# Patient Record
Sex: Male | Born: 1940
Health system: Southern US, Community
[De-identification: ages and names within clinical notes are randomized; demographics above are authoritative.]

## PROBLEM LIST (undated history)

## (undated) DIAGNOSIS — J189 Pneumonia, unspecified organism: Secondary | ICD-10-CM

## (undated) DIAGNOSIS — J449 Chronic obstructive pulmonary disease, unspecified: Secondary | ICD-10-CM

## (undated) DIAGNOSIS — E079 Disorder of thyroid, unspecified: Secondary | ICD-10-CM

## (undated) DIAGNOSIS — R011 Cardiac murmur, unspecified: Secondary | ICD-10-CM

## (undated) DIAGNOSIS — E785 Hyperlipidemia, unspecified: Secondary | ICD-10-CM

## (undated) DIAGNOSIS — R0600 Dyspnea, unspecified: Secondary | ICD-10-CM

## (undated) DIAGNOSIS — M4722 Other spondylosis with radiculopathy, cervical region: Secondary | ICD-10-CM

## (undated) DIAGNOSIS — K219 Gastro-esophageal reflux disease without esophagitis: Secondary | ICD-10-CM

## (undated) DIAGNOSIS — I10 Essential (primary) hypertension: Secondary | ICD-10-CM

## (undated) HISTORY — DX: Gastro-esophageal reflux disease without esophagitis: K21.9

## (undated) HISTORY — PX: COLONOSCOPY: SHX174

## (undated) HISTORY — DX: Essential (primary) hypertension: I10

## (undated) HISTORY — DX: Cardiac murmur, unspecified: R01.1

## (undated) HISTORY — PX: HERNIA REPAIR: SHX51

## (undated) HISTORY — PX: TONSILLECTOMY: SUR1361

## (undated) HISTORY — DX: Pneumonia, unspecified organism: J18.9

## (undated) HISTORY — PX: LEG SURGERY: SHX1003

## (undated) HISTORY — DX: Hyperlipidemia, unspecified: E78.5

---

## 2003-05-10 HISTORY — PX: ROTATOR CUFF REPAIR: SHX139

## 2009-10-07 DIAGNOSIS — J189 Pneumonia, unspecified organism: Secondary | ICD-10-CM

## 2009-10-07 HISTORY — DX: Pneumonia, unspecified organism: J18.9

## 2010-10-20 ENCOUNTER — Encounter: Payer: Self-pay | Admitting: Family Medicine

## 2010-10-20 ENCOUNTER — Ambulatory Visit (INDEPENDENT_AMBULATORY_CARE_PROVIDER_SITE_OTHER): Payer: Medicare Other | Admitting: Family Medicine

## 2010-10-20 VITALS — BP 127/81 | HR 73 | Resp 20 | Ht 69.0 in | Wt 166.0 lb

## 2010-10-20 DIAGNOSIS — E039 Hypothyroidism, unspecified: Secondary | ICD-10-CM | POA: Insufficient documentation

## 2010-10-20 DIAGNOSIS — J449 Chronic obstructive pulmonary disease, unspecified: Secondary | ICD-10-CM | POA: Insufficient documentation

## 2010-10-20 HISTORY — DX: Chronic obstructive pulmonary disease, unspecified: J44.9

## 2010-10-20 NOTE — Assessment & Plan Note (Signed)
He plans to schedule a physical this summer he will be due to recheck his thyroid at that point. I explained this is a lifelong diagnosis and typically does not resolve. We will monitor his levels and adjust as needed.

## 2010-10-20 NOTE — Patient Instructions (Signed)
Please schedule a physical anytime.

## 2010-10-20 NOTE — Assessment & Plan Note (Signed)
I am not sure what stage he is. I discussed that depending on the stage there are certain guidelines that recommend certain inhalers to help prevent decreased functioning of his lungs. I did explain that the inhalers may not make a significant difference in how he feels on a daily basis. I suspect that likely [facility] Symbicort because he felt the albuterol wasn't helping. Does he is likely either mild or moderately stage COPD. I would like to get his records I can better have a sense of what types of inhalers he should be using. We could also consider adding Spiriva if he if he has moderate COPD and see if he feels like he notices more improvement on that medication versus the Symbicort. Symbicort is typically used for severe COPD.

## 2010-10-20 NOTE — Progress Notes (Signed)
Subjective:    Patient ID: Zachary Waters, male    DOB: November 29, 1940, 70 y.o.   MRN: [id number]  HPI Got PNA last year and ever since hasn't felt his exercise capacity is up to par.  Wasn't able to get back to his usual level of exercise. Went to see [facility] Chest and told has emphysema. He Is not sure what stage he is.   Normal cardiac workup around that time as well. He was given Clifton Custard didn't notice any significant difference with his ability to exercise 7 he was started on higher dose of Symbicort.  He still has not noticed any significant difference in his breathing on his ability to exercise. He would like to get back to his previous level of exercise.  Wonders if has too many BMs he has 4 BMs usually in the first 2-3 hours of the morning.  He has no further bowel movements during the day. His stools are soft and he does not have to strain. The STIR or abdominal pain. He does wonders if this is normal.  He was also diagnosed with hypothyroid during this last year. He was initially started on 25 mcg of Synthroid and then increase to 50 mcg. He wonders if he really needs this medication.   Review of Systems  Constitutional: Negative for fever, diaphoresis and unexpected weight change.  HENT: Negative for hearing loss, rhinorrhea, sneezing and tinnitus.   Eyes: Negative for visual disturbance.  Respiratory: Negative for cough and wheezing.   Cardiovascular: Negative for chest pain and palpitations.  Gastrointestinal: Negative for nausea, vomiting, diarrhea and blood in stool.  Genitourinary: Negative for dysuria and discharge.  Musculoskeletal: Negative for myalgias and arthralgias.  Skin: Negative for rash.  Neurological: Negative for headaches.  Hematological: Negative for adenopathy.  Psychiatric/Behavioral: Negative for sleep disturbance and dysphoric mood. The patient is not nervous/anxious.    BP 127/81  Pulse 73  Resp 20  Ht 5\' 9"  (1.753 m)  Wt 166 lb (75.297 kg)  BMI 24.51 kg/m2   SpO2 96%    No Known Allergies  Past Medical History  Diagnosis Date  . Pneumonia 10/2009    Past Surgical History  Procedure Date  . Rotator cuff repair 2005    History   Social History  . Marital Status: Married    Spouse Name: N/A    Number of Children: N/A  . Years of Education: N/A   Occupational History  . retired    Social History Main Topics  . Smoking status: Former Smoker    Types: Pipe    Quit date: 05/09/2009  . Smokeless tobacco: Not on file  . Alcohol Use: Not on file  . Drug Use: Not on file  . Sexually Active: Not on file   Other Topics Concern  . Not on file   Social History Narrative   Power walks 3 days per week for 2 miles. Golfs twice a week.     Family History  Problem Relation Age of Onset  . Prostate cancer Father   . Depression Brother     Commited suicide    Current outpatient prescriptions:albuterol (PROAIR HFA) 108 (90 BASE) MCG/ACT inhaler, Inhale 2 puffs into the lungs every 6 (six) hours as needed.  , Disp: , Rfl: ;  budesonide-formoterol (SYMBICORT) 160-4.5 MCG/ACT inhaler, Inhale 2 puffs into the lungs 2 (two) times daily.  , Disp: , Rfl: ;  levothyroxine (SYNTHROID, LEVOTHROID) 50 MCG tablet, Take 50 mcg by mouth daily.  , Disp: ,  Rfl:      Objective:   Physical Exam  Constitutional: He is oriented to person, place, and time. He appears well-developed and well-nourished.  HENT:  Head: Atraumatic.  Eyes: Conjunctivae are normal.  Neck: Neck supple. No thyromegaly present.  Cardiovascular: Normal rate, regular rhythm and normal heart sounds.   Pulmonary/Chest: Effort normal and breath sounds normal.  Musculoskeletal: He exhibits no edema.  Lymphadenopathy:    He has no cervical adenopathy.  Neurological: He is alert and oriented to person, place, and time.  Skin: Skin is warm and dry.       He does have a significant amount of sun damage.  Psychiatric: He has a normal mood and affect.          Assessment & Plan:    in

## 2010-10-27 ENCOUNTER — Encounter: Payer: Self-pay | Admitting: Family Medicine

## 2010-10-27 ENCOUNTER — Ambulatory Visit (INDEPENDENT_AMBULATORY_CARE_PROVIDER_SITE_OTHER): Payer: Medicare Other | Admitting: Family Medicine

## 2010-10-27 VITALS — BP 129/79 | HR 74 | Resp 20 | Ht 68.0 in | Wt 166.0 lb

## 2010-10-27 DIAGNOSIS — Z0000 Encounter for general adult medical examination without abnormal findings: Secondary | ICD-10-CM

## 2010-10-27 DIAGNOSIS — E039 Hypothyroidism, unspecified: Secondary | ICD-10-CM

## 2010-10-27 MED ORDER — AMBULATORY NON FORMULARY MEDICATION: Status: DC

## 2010-10-27 NOTE — Patient Instructions (Signed)
Keep up the exercise and healthy diet! We will call you with your lab results.

## 2010-10-27 NOTE — Progress Notes (Signed)
Subjective:    Patient ID: Zachary Waters, male    DOB: 1941-03-05, 70 y.o.   MRN: [id number]  HPI Here for CPE.  No specific complaints.  Works our regularly. Healthy diet.    Review of Systems No chest pain, shortness of breath, blood in the stool or urine. No recent vision changes. He does have some mild hearing loss.  BP 129/79  Pulse 74  Resp 20  Ht 5\' 8"  (1.727 m)  Wt 166 lb (75.297 kg)  BMI 25.24 kg/m2  SpO2 96%    No Known Allergies  Past Medical History  Diagnosis Date  . Pneumonia 10/2009    Past Surgical History  Procedure Date  . Rotator cuff repair 2005    Left    History   Social History  . Marital Status: Married    Spouse Name: N/A    Number of Children: 2  . Years of Education: N/A   Occupational History  . retired    Social History Main Topics  . Smoking status: Former Smoker    Types: Pipe    Quit date: 10/07/2009  . Smokeless tobacco: Not on file  . Alcohol Use: Yes  . Drug Use: No  . Sexually Active: Not on file   Other Topics Concern  . Not on file   Social History Narrative   Power walks 3 days per week for 2 miles. Golfs twice a week.     Family History  Problem Relation Age of Onset  . Prostate cancer Father 44  . Depression Brother     Commited suicide  . Heart disease Mother 57    deceased age 84    Current outpatient prescriptions:albuterol (PROAIR HFA) 108 (90 BASE) MCG/ACT inhaler, Inhale 2 puffs into the lungs every 6 (six) hours as needed.  , Disp: , Rfl: ;  aspirin 81 MG tablet, Take 81 mg by mouth daily.  , Disp: , Rfl: ;  budesonide-formoterol (SYMBICORT) 160-4.5 MCG/ACT inhaler, Inhale 2 puffs into the lungs 2 (two) times daily.  , Disp: , Rfl:  levothyroxine (SYNTHROID, LEVOTHROID) 50 MCG tablet, Take 50 mcg by mouth daily.  , Disp: , Rfl: ;  AMBULATORY NON FORMULARY MEDICATION, Medication Name: Zostavax IM x 1, Disp: 1 vial, Rfl: 0     Objective:   Physical Exam  Constitutional: He is oriented to person,  place, and time. He appears well-developed and well-nourished.  HENT:  Head: Normocephalic and atraumatic.  Right Ear: External ear normal.  Left Ear: External ear normal.  Nose: Nose normal.  Mouth/Throat: Oropharynx is clear and moist.  Eyes: Conjunctivae and EOM are normal. Pupils are equal, round, and reactive to light.  Neck: Normal range of motion. Neck supple. No thyromegaly present.  Cardiovascular: Normal rate, regular rhythm, normal heart sounds and intact distal pulses.   Pulmonary/Chest: Effort normal and breath sounds normal.  Abdominal: Soft. Bowel sounds are normal. He exhibits no distension and no mass. There is no tenderness. There is no rebound and no guarding.  Genitourinary: Rectum normal. Prostate is enlarged. Prostate is not tender.       External hemorrhoid.   Musculoskeletal: Normal range of motion.  Lymphadenopathy:    He has no cervical adenopathy.  Neurological: He is alert and oriented to person, place, and time. He has normal reflexes.  Skin: Skin is warm and dry.  Psychiatric: He has a normal mood and affect. His behavior is normal. Judgment and thought content normal.   Guiaiac negative.  Assessment & Plan:  CPE- EXam is normal. Will send for fasting labs. Given a slip for the shingles vaccine. Thinks had Tdap in the last 10 years but will await old records and review. He did have a slightly enlarged prostate on exam. We'll check a PSA today as he does have a family history of colon cancer.  Hypothyroid he is to recheck his levels and we will do that today as well.

## 2010-10-28 ENCOUNTER — Telehealth: Payer: Self-pay | Admitting: Family Medicine

## 2010-10-28 LAB — LIPID PANEL
LDL Cholesterol: 133 mg/dL — ABNORMAL HIGH (ref 0–99)
Triglycerides: 68 mg/dL (ref <150)
VLDL: 14 mg/dL (ref 0–40)

## 2010-10-28 LAB — COMPLETE METABOLIC PANEL WITH GFR
ALT: 18 U/L (ref 0–53)
AST: 21 U/L (ref 0–37)
Albumin: 4.6 g/dL (ref 3.5–5.2)
CO2: 30 mEq/L (ref 19–32)
Calcium: 9.5 mg/dL (ref 8.4–10.5)
Chloride: 105 mEq/L (ref 96–112)
Creat: 1.07 mg/dL (ref 0.50–1.35)
GFR, Est African American: >60 mL/min (ref >60)
Potassium: 5 mEq/L (ref 3.5–5.3)
Sodium: 140 mEq/L (ref 135–145)
Total Protein: 6.6 g/dL (ref 6.0–8.3)

## 2010-10-28 MED ORDER — LEVOTHYROXINE SODIUM 75 MCG PO TABS: 50.0000 ug | ORAL_TABLET | Freq: Every day | ORAL | Status: DC

## 2010-10-28 NOTE — Telephone Encounter (Signed)
Pt notified pt of results of rec.

## 2010-10-28 NOTE — Telephone Encounter (Signed)
Call patient: Complete metabolic panel is normal. PSA is normal. TSH is just borderline elevated. Based on this level are recommended increasing his dose and rechecking in 8 weeks. His total cholesterol and LDL cholesterol is slightly elevated. Continue to work on exercise and diet and consider adding fish oil or flaxseed to your diet. Recheck in one year cholesterol.

## 2010-11-05 ENCOUNTER — Telehealth: Payer: Self-pay | Admitting: Family Medicine

## 2010-11-05 NOTE — Telephone Encounter (Signed)
Pt called and is concerned about his current dose of thyroid medcation.  TSH was high over 4 and pt dose changed from 50 mcg to 75 mcg and to cut the 75 mcg in half to make 37.5 mch daily.   Plan:  Instructed and explained the regimen to the patient.  Pt voiced understanding after explained to him. Jarvis Newcomer, LPN [facility]

## 2011-07-11 ENCOUNTER — Other Ambulatory Visit: Payer: Self-pay | Admitting: *Deleted

## 2011-07-11 MED ORDER — LEVOTHYROXINE SODIUM 75 MCG PO TABS: 50.0000 ug | ORAL_TABLET | Freq: Every day | ORAL | Status: DC

## 2011-11-08 ENCOUNTER — Encounter: Payer: Self-pay | Admitting: Family Medicine

## 2012-01-03 ENCOUNTER — Ambulatory Visit (INDEPENDENT_AMBULATORY_CARE_PROVIDER_SITE_OTHER): Payer: Medicare Other | Admitting: Family Medicine

## 2012-01-03 ENCOUNTER — Encounter: Payer: Self-pay | Admitting: Family Medicine

## 2012-01-03 VITALS — BP 140/90 | HR 61 | Ht 68.0 in | Wt 170.0 lb

## 2012-01-03 DIAGNOSIS — J449 Chronic obstructive pulmonary disease, unspecified: Secondary | ICD-10-CM

## 2012-01-03 DIAGNOSIS — E039 Hypothyroidism, unspecified: Secondary | ICD-10-CM

## 2012-01-03 DIAGNOSIS — Z13220 Encounter for screening for lipoid disorders: Secondary | ICD-10-CM

## 2012-01-03 DIAGNOSIS — Z0000 Encounter for general adult medical examination without abnormal findings: Secondary | ICD-10-CM

## 2012-01-03 DIAGNOSIS — Z23 Encounter for immunization: Secondary | ICD-10-CM

## 2012-01-03 DIAGNOSIS — Z125 Encounter for screening for malignant neoplasm of prostate: Secondary | ICD-10-CM

## 2012-01-03 LAB — COMPLETE METABOLIC PANEL WITH GFR
ALT: 16 U/L (ref 0–53)
Alkaline Phosphatase: 50 U/L (ref 39–117)
Creat: 1 mg/dL (ref 0.50–1.35)
GFR, Est African American: 89 mL/min
GFR, Est Non African American: 77 mL/min
Glucose, Bld: 85 mg/dL (ref 70–99)
Sodium: 140 mEq/L (ref 135–145)
Total Bilirubin: 0.6 mg/dL (ref 0.3–1.2)
Total Protein: 6.8 g/dL (ref 6.0–8.3)

## 2012-01-03 LAB — LIPID PANEL
HDL: 59 mg/dL (ref >39)
Total CHOL/HDL Ratio: 3.1 Ratio
Triglycerides: 64 mg/dL (ref <150)

## 2012-01-03 LAB — TSH: TSH: 6.418 u[IU]/mL — ABNORMAL HIGH (ref 0.350–4.500)

## 2012-01-03 LAB — PSA: PSA: 1.14 ng/mL (ref <=4.00)

## 2012-01-03 NOTE — Progress Notes (Signed)
Subjective:    Zachary Waters is a 71 y.o. male who presents for Medicare Annual/Subsequent preventive examination. He updated me on a couple of issues today. He has which pulmonologist's he is much happier were he's going now with Dr. Onalee Hua. They have switched around his medication regimen. He just completed a course of what sounds like cardiac rehabilitation and is looking into doing pulmonary rehabilitation as well. He feels like this is been very helpful and great for him.   Preventive Screening-Counseling & Management  Tobacco History  Smoking status  . Former Smoker  . Types: Pipe  . Quit date: 10/07/2009  Smokeless tobacco  . Not on file    Problems Prior to Visit 1.   Current Problems (verified) Patient Active Problem List  Diagnosis  . Hypothyroid  . COPD (chronic obstructive pulmonary disease)    Medications Prior to Visit Current Outpatient Prescriptions on File Prior to Visit  Medication Sig Dispense Refill  . albuterol (PROAIR HFA) 108 (90 BASE) MCG/ACT inhaler Inhale 2 puffs into the lungs every 6 (six) hours as needed.        Marland Kitchen aspirin 81 MG tablet Take 81 mg by mouth daily.        Marland Kitchen tiotropium (SPIRIVA) 18 MCG inhalation capsule Place 18 mcg into inhaler and inhale daily.        Current Medications (verified) Current Outpatient Prescriptions  Medication Sig Dispense Refill  . albuterol (PROAIR HFA) 108 (90 BASE) MCG/ACT inhaler Inhale 2 puffs into the lungs every 6 (six) hours as needed.        . Ascorbic Acid (VITAMIN C PO) Take by mouth.      Marland Kitchen aspirin 81 MG tablet Take 81 mg by mouth daily.        . Fish Oil OIL by Does not apply route.      Marland Kitchen levothyroxine (SYNTHROID, LEVOTHROID) 50 MCG tablet Take 50 mcg by mouth daily.      . mometasone-formoterol (DULERA) 100-5 MCG/ACT AERO Inhale 2 puffs into the lungs 2 (two) times daily.      Marland Kitchen tiotropium (SPIRIVA) 18 MCG inhalation capsule Place 18 mcg into inhaler and inhale daily.         Allergies  (verified) Review of patient's allergies indicates no known allergies.   PAST HISTORY  Family History Family History  Problem Relation Age of Onset  . Prostate cancer Father 52  . Depression Brother     Commited suicide  . Heart disease Mother 79    deceased age 15    Social History History  Substance Use Topics  . Smoking status: Former Smoker    Types: Pipe    Quit date: 10/07/2009  . Smokeless tobacco: Not on file  . Alcohol Use: Yes    Are there smokers in your home (other than you)?  No  Risk Factors Current exercise habits: Working out 3 times per week  Dietary issues discussed: doing well.     Cardiac risk factors: advanced age (older than 38 for men, 42 for women) and male gender.  Depression Screen (Note: if answer to either of the following is "Yes", a more complete depression screening is indicated)   Q1: Over the past two weeks, have you felt down, depressed or hopeless? No  Q2: Over the past two weeks, have you felt little interest or pleasure in doing things? No  Have you lost interest or pleasure in daily life? No  Do you often feel hopeless? No  Do you  cry easily over simple problems? No  Activities of Daily Living In your present state of health, do you have any difficulty performing the following activities?:  Driving? No Managing money?  No Feeding yourself? No Getting from bed to chair? No Climbing a flight of stairs? No Preparing food and eating?: No Bathing or showering? No Getting dressed: No Getting to the toilet? No Using the toilet:No Moving around from place to place: No In the past year have you fallen or had a near fall?:No   Are you sexually active?  Yes  Do you have more than one partner?  No  Hearing Difficulties: Yes Do you often ask people to speak up or repeat themselves? Yes Do you experience ringing or noises in your ears? No Do you have difficulty understanding soft or whispered voices? Yes   Do you feel that you  have a problem with memory? No  Do you often misplace items? No  Do you feel safe at home?  No  Cognitive Testing  Alert? Yes  Normal Appearance?Yes  Oriented to person? Yes  Place? Yes   Time? Yes  Recall of three objects?  Yes  Can perform simple calculations? Yes  Displays appropriate judgment?Yes  Can read the correct time from a watch face?Yes   Advanced Directives have been discussed with the patient? Yes   List the Names of Other Physician/Practitioners you currently use: 1.  Dr. Jenne Pane _ Pulmonology 2. Dr. Nathanial Rancher - Chiropractor.   Indicate any recent Medical Services you may have received from other than [facility] providers in the past year (date may be approximate).  Immunization History  Administered Date(s) Administered  . Pneumococcal Polysaccharide 05/09/2010  . Zoster 10/27/2010    Screening Tests Health Maintenance  Topic Date Due  . Influenza Vaccine  02/07/2012  . Colonoscopy  05/10/2019  . Tetanus/tdap  01/02/2022  . Pneumococcal Polysaccharide Vaccine Age 66 And Over  Completed  . Zostavax  Completed    All answers were reviewed with the patient and necessary referrals were made:  METHENEY,CATHERINE, MD   01/03/2012   History reviewed: allergies, current medications, past family history, past medical history, past social history, past surgical history and problem list  Review of Systems A comprehensive review of systems was negative.    Objective:     Has app for vision check.  Blood pressure 140/90, pulse 61, height 5\' 8"  (1.727 m), weight 170 lb (77.111 kg), SpO2 98.00%. Body mass index is 25.85 kg/(m^2).  BP 140/90  Pulse 61  Ht 5\' 8"  (1.727 m)  Wt 170 lb (77.111 kg)  BMI 25.85 kg/m2  SpO2 98%  General Appearance:    Alert, cooperative, no distress, appears stated age  Head:    Normocephalic, without obvious abnormality, atraumatic  Eyes:    PERRL, conjunctiva/corneas clear, EOM's intact, both eyes       Ears:    Normal TM's and  external ear canals, both ears  Nose:   Nares normal, septum midline, mucosa normal, no drainage    or sinus tenderness  Throat:   Lips, mucosa, and tongue normal; teeth and gums normal  Neck:   Supple, symmetrical, trachea midline, no adenopathy;       thyroid:  No enlargement/tenderness/nodules; no carotid   bruit or JVD  Back:     Symmetric, no curvature, ROM normal, no CVA tenderness  Lungs:     Clear to auscultation bilaterally, respirations unlabored  Chest wall:    No tenderness or  deformity  Heart:    Regular rate and rhythm, S1 and S2 normal, no murmur, rub   or gallop  Abdomen:     Soft, non-tender, bowel sounds active all four quadrants,    no masses, no organomegaly  Genitalia:    Normal male without lesion, discharge or tenderness  Rectal:    Normal tone, normal prostate, no masses or tenderness;   guaiac negative stool  Extremities:   Extremities normal, atraumatic, no cyanosis or edema  Pulses:   2+ and symmetric all extremities  Skin:   Skin color, texture, turgor normal, no rashes or lesions  Lymph nodes:   Cervical, supraclavicular, and axillary nodes normal  Neurologic:   CNII-XII intact. Normal strength, sensation and reflexes      throughout       Assessment:     Annual Wellness Exam, Medicare     Plan:     During the course of the visit the patient was educated and counseled about appropriate screening and preventive services including:    Td vaccine  Prostate cancer screening  Diet review for nutrition referral? Yes ____  Not Indicated __x__   Patient Instructions (the written plan) was given to the patient.  Medicare Attestation I have personally reviewed: The patient's medical and social history Their use of alcohol, tobacco or illicit drugs Their current medications and supplements The patient's functional ability including ADLs,fall risks, home safety risks, cognitive, and hearing and visual impairment Diet and physical  activities Evidence for depression or mood disorders  The patient's weight, height, BMI, and visual acuity have been recorded in the chart.  I have made referrals, counseling, and provided education to the patient based on review of the above and I have provided the patient with a written personalized care plan for preventive services.     METHENEY,CATHERINE, MD   01/03/2012

## 2012-01-03 NOTE — Patient Instructions (Addendum)
Keep up your regular exercise program and make sure you are eating a healthy diet Try to eat 4 servings of dairy a day. Your vaccines are up to date.

## 2012-01-06 ENCOUNTER — Encounter: Payer: Self-pay | Admitting: Sports Medicine

## 2012-01-06 ENCOUNTER — Ambulatory Visit (INDEPENDENT_AMBULATORY_CARE_PROVIDER_SITE_OTHER): Payer: Medicare Other | Admitting: Sports Medicine

## 2012-01-06 ENCOUNTER — Ambulatory Visit (INDEPENDENT_AMBULATORY_CARE_PROVIDER_SITE_OTHER): Payer: Medicare Other

## 2012-01-06 VITALS — BP 139/74 | HR 66 | Temp 98.0°F | Wt 175.0 lb

## 2012-01-06 DIAGNOSIS — M25512 Pain in left shoulder: Secondary | ICD-10-CM | POA: Insufficient documentation

## 2012-01-06 DIAGNOSIS — M47812 Spondylosis without myelopathy or radiculopathy, cervical region: Secondary | ICD-10-CM

## 2012-01-06 DIAGNOSIS — M25519 Pain in unspecified shoulder: Secondary | ICD-10-CM

## 2012-01-06 MED ORDER — PREDNISONE 50 MG PO TABS: ORAL_TABLET | ORAL | Status: AC

## 2012-01-06 MED ORDER — MELOXICAM 15 MG PO TABS: ORAL_TABLET | ORAL | Status: AC

## 2012-01-06 NOTE — Patient Instructions (Addendum)
Please have your chiropractor work on cervical spine rehabilitation, and paraspinal muscle strengthening. Prednisone, and Mobic. X-rays. I think that your pain is related to left-sided C8 cervical radiculopathy

## 2012-01-06 NOTE — Assessment & Plan Note (Addendum)
Symptoms are highly suggestive of a C8 radiculopathy. X-rays. Rehabilitation with his chiropractor. Mobic, and prednisone. Some of the symptoms are suggestive of left-sided rotator cuff dysfunction. We will keep this in the back of our mind for subsequent office visits. I may even offer him a diagnostic subacromial injection under ultrasound guidance selected also visualize his cuff at the next visit.

## 2012-01-06 NOTE — Progress Notes (Signed)
Patient ID: Zachary Waters, male   DOB: 07-03-1940, 71 y.o.   MRN: [id number] Subjective:    I'm seeing this patient as a consultation for:  Dr. 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  He localizes the pain over the deltoid, and notes that it radiates down the posterior aspect of his left arm, and into his fourth and fifth fingers. He describes it as numbness, tingling coming as well as a burning sensation. He has had a rotator cuff repair on the left side in the past, and notes that his pain is different than the pain he experienced during his rotator cuff issues. He has been seeing a chiropractor who is been cracking his neck, but has never had any formal treatment for this.  He denies any trauma, denies any fevers, chills, denies any loss of bowel, or bladder function. He also denies any lower extremity weakness.  Past medical history, Surgical history, Family history, Social history, Allergies, and medications have been entered into the medical record, reviewed, and no changes needed.   Review of Systems: No headache, visual changes, nausea, vomiting, diarrhea, constipation, dizziness, abdominal pain, skin rash, fevers, chills, night sweats, weight loss, body aches, joint swelling, muscle aches, chest pain, or shortness of breath.   Objective:   Vitals:  Afebrile, vital signs stable. General: Well Developed, well nourished, and in no acute distress.  Neuro: Alert and oriented x3, extra-ocular muscles intact.  Skin: Warm and dry, no rashes noted.  Respiratory: Not using accessory muscles, speaking in full sentences.  Cardiovascular: Pulses palpable, no extremity edema. Neck: Inspection unremarkable. No palpable stepoffs. Negative Spurling's maneuver. Full neck range of motion Grip strength and sensation normal in bilateral hands Strength is weak to hand intrinsics  on the left side. No sensory change to C4 to T1 Negative Hoffman sign bilaterally Reflexes normal  He does have some weakness to supraspinatus, it causes pain.  Impression and Recommendations:

## 2012-01-19 ENCOUNTER — Telehealth: Payer: Self-pay | Admitting: *Deleted

## 2012-01-19 DIAGNOSIS — M5412 Radiculopathy, cervical region: Secondary | ICD-10-CM

## 2012-01-19 NOTE — Telephone Encounter (Signed)
I am certainly happy to obtain an MRI of the cervical spine, however regardless of what it shows, the initial treatment remains conservative for the first 4 weeks (in an effort to keep needles out of his neck). It is only after failure of conservative therapy for the first 4 weeks that we would consider interventional injections followed by operative management, which is what the MRI is for. If he still wants me to obtain an MRI Zachary Waters, you may add the order for MRI of the cervical spine with no contrast, to assess left C8 radiculopathy. Thanks.

## 2012-01-19 NOTE — Telephone Encounter (Signed)
I don't see where we have informed pt of XR results and he is calling asking about results and about have an MRI done. Please advise.

## 2012-01-19 NOTE — Telephone Encounter (Signed)
LM for pt to returncall

## 2012-01-19 NOTE — Telephone Encounter (Signed)
Told pt your response and he states he looked at the xrays with Huntley Dec neal last night and she told him this is not something he needs a chiropractor for and that he should get an MRI ASAP. Please advise.

## 2012-01-19 NOTE — Telephone Encounter (Signed)
Shows lower cervical spine arthritis in the facet joints at the C5-6 level. He should continue his rehabilitation with his chiropractor, and do this for a total of 4 weeks, and then come back to see me at that point we will decide whether or not to pull the trigger for an MRI.

## 2012-01-20 ENCOUNTER — Telehealth: Payer: Self-pay | Admitting: *Deleted

## 2012-01-20 MED ORDER — LEVOTHYROXINE SODIUM 75 MCG PO TABS: 75.0000 ug | ORAL_TABLET | Freq: Every day | ORAL | Status: DC

## 2012-01-20 NOTE — Telephone Encounter (Signed)
Pt informed MRI scheduled. Pt states he isn't going back to chiropractor.

## 2012-01-20 NOTE — Telephone Encounter (Signed)
Call pt: I am sorry didn't get labs. They look ok but thyroid med needs to be increased. I will send new rx and then we can recheck in 6 weeks the thyroid level.

## 2012-01-20 NOTE — Telephone Encounter (Signed)
Pt called and states he has not received his labs from 8/27.they are in the comp but i dont know if they made it to your inbox

## 2012-01-20 NOTE — Telephone Encounter (Signed)
LM

## 2012-01-20 NOTE — Telephone Encounter (Signed)
MRI order placed.

## 2012-01-23 ENCOUNTER — Telehealth: Payer: Self-pay | Admitting: *Deleted

## 2012-01-23 NOTE — Telephone Encounter (Signed)
Left message on vm with dr. advise 

## 2012-01-23 NOTE — Telephone Encounter (Signed)
PA obtained for MR Cervical spine wo contrast. PA # [id number]. Good through 02/21/12.

## 2012-01-24 ENCOUNTER — Ambulatory Visit (HOSPITAL_BASED_OUTPATIENT_CLINIC_OR_DEPARTMENT_OTHER)
Admission: RE | Admit: 2012-01-24 | Discharge: 2012-01-24 | Disposition: A | Discharge status: Home / Self Care | Payer: Medicare Other | Source: Ambulatory Visit | Attending: Sports Medicine | Admitting: Sports Medicine

## 2012-01-24 DIAGNOSIS — M5020 Other cervical disc displacement, unspecified cervical region: Secondary | ICD-10-CM | POA: Insufficient documentation

## 2012-01-24 DIAGNOSIS — M4802 Spinal stenosis, cervical region: Secondary | ICD-10-CM | POA: Insufficient documentation

## 2012-01-24 DIAGNOSIS — M25519 Pain in unspecified shoulder: Secondary | ICD-10-CM | POA: Insufficient documentation

## 2012-01-24 DIAGNOSIS — M5412 Radiculopathy, cervical region: Secondary | ICD-10-CM

## 2012-01-24 DIAGNOSIS — M5090 Cervical disc disorder, unspecified, unspecified cervical region: Secondary | ICD-10-CM | POA: Insufficient documentation

## 2012-01-24 DIAGNOSIS — M542 Cervicalgia: Secondary | ICD-10-CM | POA: Insufficient documentation

## 2012-02-02 ENCOUNTER — Telehealth: Payer: Self-pay

## 2012-02-02 NOTE — Telephone Encounter (Signed)
Zachary Waters would like the results from his MRI

## 2012-02-02 NOTE — Telephone Encounter (Signed)
LMOM for patient to call back. MRI

## 2012-02-02 NOTE — Telephone Encounter (Signed)
C4-5 small central/right paracentral protrusion. Mild cord  contact.  C5-6 broad-based disc osteophyte. Minimal cord flattening.  Uncinate bony overgrowth. Moderate bilateral foraminal narrowing.  C6-7 broad-based disc osteophyte complex minimally more notable to  the left. Spinal stenosis with minimal cord flattening. Uncinate  bony overgrowth. Moderate bilateral foraminal narrowing.  C7-T1 small broad-based disc osteophyte greater to the left. Mild  spinal stenosis greater on the left. Foraminal extension of  disc/osteophyte suspected with mass effect upon the exiting nerve  root.  This is what we expected.  Predominantly left C8 radiculopathy due to foraminal stenosis.  Has he been doing any home rehab or neck rehab with his chiropracter?

## 2012-02-03 ENCOUNTER — Ambulatory Visit: Payer: Medicare Other | Admitting: Sports Medicine

## 2012-02-06 ENCOUNTER — Encounter: Payer: Self-pay | Admitting: Family Medicine

## 2012-02-06 ENCOUNTER — Ambulatory Visit (INDEPENDENT_AMBULATORY_CARE_PROVIDER_SITE_OTHER): Payer: Medicare Other | Admitting: Family Medicine

## 2012-02-06 VITALS — BP 126/70 | Ht 69.0 in | Wt 172.0 lb

## 2012-02-06 DIAGNOSIS — M25519 Pain in unspecified shoulder: Secondary | ICD-10-CM

## 2012-02-06 DIAGNOSIS — M25512 Pain in left shoulder: Secondary | ICD-10-CM

## 2012-02-06 NOTE — Progress Notes (Signed)
  Subjective:    Patient ID: Zachary Waters, male    DOB: [birth year], 71 y.o.   MRN: [id number]  HPI  Left shoulder and neck pain with some problems with his golf swing. He was seen by another physician recently having had MRI. He seeks our opinion. Noticed the issue starting about 4 months ago and gradually worsening. Initially it was just a tingling or shooting pain that was very occasional in his left arm. Then he started noticing any time he played golf he would have residual pain for a few hours afterward. I noticed it even driving he has left scapular pain and numbness radiating into his fourth and fifth finger. He has not really noticed weakness but has noticed that his golf game is not doing well.   Patient Active Problem List  Diagnosis  . Hypothyroid  . COPD (chronic obstructive pulmonary disease)  . Left shoulder, arm, and hand pain   Current Outpatient Prescriptions on File Prior to Visit  Medication Sig Dispense Refill  . albuterol (PROAIR HFA) 108 (90 BASE) MCG/ACT inhaler Inhale 2 puffs into the lungs every 6 (six) hours as needed.        . Ascorbic Acid (VITAMIN C PO) Take by mouth.      Marland Kitchen aspirin 81 MG tablet Take 81 mg by mouth daily.        . Fish Oil OIL by Does not apply route.      Marland Kitchen levothyroxine (SYNTHROID, LEVOTHROID) 75 MCG tablet Take 1 tablet (75 mcg total) by mouth daily.  30 tablet  1  . meloxicam (MOBIC) 15 MG tablet One tab PO qAM with breakfast for 2 weeks, then daily prn pain.  30 tablet  3  . mometasone-formoterol (DULERA) 100-5 MCG/ACT AERO Inhale 2 puffs into the lungs 2 (two) times daily.      Marland Kitchen tiotropium (SPIRIVA) 18 MCG inhalation capsule Place 18 mcg into inhaler and inhale daily.       No Known Allergies   Review of Systems , Sweats, chills. Please see his see history of present illness above for pertinent review of systems.    Objective:   Physical Exam  Vital signs are reviewed GENERAL: Well-developed male no acute distress SHOULDER: Left.  Full range of motion in all planes the rotator cuff. He has some weakness in muscles supplied by the C6-C7 nerve root. Mildly limited lateral rotation to each side. Positive Spurling some the left. IMAGING: MRI shows foraminal narrowing is in disc bulging at C6-7Is particularly on the left with some concomitant spinal stenosis.      Assessment & Plan:  #1. Cervical radiculopathy. Spent a long time discussing options with him. He would like to have surgical evaluation I will set that up. Should his pain worsen in the near future we could consider starting gabapentin at night. Right now he would like to avoid any type of medication if at all possible.

## 2012-02-06 NOTE — Patient Instructions (Addendum)
Great to see you! We will be calling you with an appointment for the Neurosurgeon.

## 2012-02-08 ENCOUNTER — Telehealth: Payer: Self-pay | Admitting: *Deleted

## 2012-02-08 NOTE — Telephone Encounter (Signed)
Pt is scheduled for an appointment with Dr. Andrey Campanile at [facility] on 03/08/12 at 8:15 in the morning.  Address is [address] [facility].   Called pt and left VM for him to call back re: appointment.

## 2012-02-08 NOTE — Telephone Encounter (Signed)
Spoke with pt & gave him appt info

## 2012-02-10 ENCOUNTER — Ambulatory Visit: Payer: Medicare Other | Admitting: Sports Medicine

## 2012-02-10 DIAGNOSIS — Z0289 Encounter for other administrative examinations: Secondary | ICD-10-CM

## 2012-03-08 DIAGNOSIS — M4722 Other spondylosis with radiculopathy, cervical region: Secondary | ICD-10-CM

## 2012-03-08 HISTORY — DX: Other spondylosis with radiculopathy, cervical region: M47.22

## 2012-04-16 ENCOUNTER — Other Ambulatory Visit: Payer: Self-pay | Admitting: Family Medicine

## 2012-04-20 ENCOUNTER — Other Ambulatory Visit: Payer: Self-pay | Admitting: *Deleted

## 2012-04-20 DIAGNOSIS — E039 Hypothyroidism, unspecified: Secondary | ICD-10-CM

## 2012-04-23 ENCOUNTER — Other Ambulatory Visit: Payer: Self-pay | Admitting: Family Medicine

## 2012-04-24 ENCOUNTER — Other Ambulatory Visit: Payer: Self-pay | Admitting: *Deleted

## 2012-06-01 ENCOUNTER — Encounter: Payer: Self-pay | Admitting: Family Medicine

## 2012-06-11 ENCOUNTER — Other Ambulatory Visit: Payer: Self-pay | Admitting: Family Medicine

## 2012-06-13 ENCOUNTER — Encounter: Payer: Self-pay | Admitting: Family Medicine

## 2012-08-07 ENCOUNTER — Telehealth: Payer: Self-pay | Admitting: *Deleted

## 2012-08-07 ENCOUNTER — Other Ambulatory Visit: Payer: Self-pay | Admitting: Family Medicine

## 2012-08-07 MED ORDER — LEVOTHYROXINE SODIUM 75 MCG PO TABS: ORAL_TABLET | ORAL | Status: DC

## 2012-08-07 NOTE — Telephone Encounter (Signed)
No it is ok. I sent new refills over. Recheck level in 3 months ( so will have been 6 mo since last check).

## 2012-08-07 NOTE — Telephone Encounter (Signed)
Pt had TSH labs drawn back in dec.  His levothyroxine was filled this morning for 30 days with a note that says "needs appt".  Do you need him to see you or just 3 month labs? Please advise

## 2012-08-08 ENCOUNTER — Other Ambulatory Visit: Payer: Self-pay | Admitting: *Deleted

## 2012-08-08 MED ORDER — LEVOTHYROXINE SODIUM 75 MCG PO TABS: ORAL_TABLET | ORAL | Status: DC

## 2012-09-11 ENCOUNTER — Telehealth: Payer: Self-pay | Admitting: *Deleted

## 2012-12-04 ENCOUNTER — Emergency Department
Admission: EM | Admit: 2012-12-04 | Discharge: 2012-12-04 | Disposition: A | Discharge status: Home / Self Care | Payer: Self-pay | Source: Home / Self Care | Attending: Family Medicine | Admitting: Family Medicine

## 2012-12-04 ENCOUNTER — Encounter: Payer: Self-pay | Admitting: *Deleted

## 2012-12-04 DIAGNOSIS — S81809A Unspecified open wound, unspecified lower leg, initial encounter: Secondary | ICD-10-CM

## 2012-12-04 DIAGNOSIS — S81812A Laceration without foreign body, left lower leg, initial encounter: Secondary | ICD-10-CM

## 2012-12-04 HISTORY — DX: Disorder of thyroid, unspecified: E07.9

## 2012-12-04 HISTORY — DX: Chronic obstructive pulmonary disease, unspecified: J44.9

## 2012-12-04 NOTE — ED Provider Notes (Signed)
CSN: [id number]     Arrival date & time 12/04/12  1304 History     None    Chief Complaint  Patient presents with  . Extremity Laceration      HPI Comments: Approximately 1.5 hours ago patient suffered a laceration to his left lower anterior leg by the sharp edge of a chair leg.  His last Tdap was 2 years ago.  Patient is a 72 y.o. male presenting with skin laceration. The history is provided by the patient.  Laceration Location:  Leg Leg laceration location:  L lower leg Length (cm):  2 Depth:  Through dermis (flap) Bleeding: controlled   Time since incident:  90 minutes Laceration mechanism:  Metal edge Pain details:    Quality:  Aching   Severity:  Mild   Timing:  Constant   Progression:  Unchanged Relieved by:  Pressure Tetanus status:  Up to date   Past Medical History  Diagnosis Date  . Pneumonia 10/2009  . COPD (chronic obstructive pulmonary disease)   . Thyroid disease    Past Surgical History  Procedure Laterality Date  . Rotator cuff repair  2005    Left  . Tonsillectomy    . Leg surgery Right    Family History  Problem Relation Age of Onset  . Prostate cancer Father 38  . Depression Brother     Commited suicide  . Heart disease Mother 84    deceased age 19   History  Substance Use Topics  . Smoking status: Former Smoker    Types: Pipe    Quit date: 10/07/2009  . Smokeless tobacco: Not on file  . Alcohol Use: Yes    Review of Systems  All other systems reviewed and are negative.    Allergies  Review of patient's allergies indicates no known allergies.  Home Medications   Current Outpatient Rx  Name  Route  Sig  Dispense  Refill  . albuterol (PROAIR HFA) 108 (90 BASE) MCG/ACT inhaler   Inhalation   Inhale 2 puffs into the lungs every 6 (six) hours as needed.           . Ascorbic Acid (VITAMIN C PO)   Oral   Take by mouth.         Marland Kitchen aspirin 81 MG tablet   Oral   Take 81 mg by mouth daily.           . Fish Oil OIL   Does  not apply   by Does not apply route.         Marland Kitchen levothyroxine (SYNTHROID, LEVOTHROID) 75 MCG tablet      TAKE 1 TABLET EVERY DAY   30 tablet   4   . meloxicam (MOBIC) 15 MG tablet      One tab PO qAM with breakfast for 2 weeks, then daily prn pain.   30 tablet   3   . mometasone-formoterol (DULERA) 100-5 MCG/ACT AERO   Inhalation   Inhale 2 puffs into the lungs 2 (two) times daily.         Marland Kitchen tiotropium (SPIRIVA) 18 MCG inhalation capsule   Inhalation   Place 18 mcg into inhaler and inhale daily.          BP 159/96  Pulse 69  Temp(Src) 98.7 F (37.1 C) (Oral)  Resp 18  SpO2 97% Physical Exam  Nursing note and vitals reviewed. Constitutional: He is oriented to person, place, and time. He appears well-developed and well-nourished. No distress.  HENT:  Head: Atraumatic.  Eyes: Conjunctivae are normal. Pupils are equal, round, and reactive to light.  Musculoskeletal: Normal range of motion.       Left lower leg: He exhibits laceration. He exhibits no tenderness, no bony tenderness, no swelling and no edema.       Legs: Left lower leg pre-tibial area reveals a curved 2cm long simple laceration through dermis as noted on diagram.  Neurological: He is alert and oriented to person, place, and time.  Skin: Skin is warm and dry.    ED Course   Procedures  Laceration Repair Discussed benefits and risks of procedure and verbal consent obtained. Using sterile technique and local 1% lidocaine with epinephrine, cleansed wound with Betadine followed by copious lavage with normal saline.  Wound carefully inspected for debris and foreign bodies; none found.  Wound closed with #4, 4-0 interrupted nylon sutures, and one central 4-0 mattress suture.  Bacitracin and non-stick sterile dressing applied.  Wound precautions explained to patient.       1. Laceration of left leg, initial encounter     MDM   Change dressing daily and apply Bacitracin ointment to wound.  Keep wound  clean and dry.  Return for any signs of infection (or follow-up with family doctor):  Increasing redness, swelling, pain, heat, drainage, etc.  Wear support hose (or ace wrap) daytime. Return in 12 to 14 days for suture removal.   Lattie Haw, MD 12/09/12 1104

## 2012-12-04 NOTE — ED Notes (Addendum)
Pt c/o RLE laceration x 90 mins ago, he reports cutting it on a piece of metal at home. He reports having a Tdap 2 years ago.

## 2013-01-02 ENCOUNTER — Telehealth: Payer: Self-pay | Admitting: *Deleted

## 2013-01-02 DIAGNOSIS — E039 Hypothyroidism, unspecified: Secondary | ICD-10-CM

## 2013-01-02 DIAGNOSIS — J449 Chronic obstructive pulmonary disease, unspecified: Secondary | ICD-10-CM

## 2013-01-02 DIAGNOSIS — Z13220 Encounter for screening for lipoid disorders: Secondary | ICD-10-CM

## 2013-01-02 DIAGNOSIS — Z125 Encounter for screening for malignant neoplasm of prostate: Secondary | ICD-10-CM

## 2013-01-02 LAB — COMPLETE METABOLIC PANEL WITH GFR
ALT: 16 U/L (ref 0–53)
AST: 20 U/L (ref 0–37)
Albumin: 4.4 g/dL (ref 3.5–5.2)
CO2: 27 mEq/L (ref 19–32)
Calcium: 8.7 mg/dL (ref 8.4–10.5)
Chloride: 106 mEq/L (ref 96–112)
GFR, Est African American: 89 mL/min
Potassium: 4.6 mEq/L (ref 3.5–5.3)
Sodium: 139 mEq/L (ref 135–145)
Total Protein: 6.4 g/dL (ref 6.0–8.3)

## 2013-01-02 LAB — LIPID PANEL
HDL: 54 mg/dL (ref >39)
LDL Cholesterol: 97 mg/dL (ref 0–99)
Total CHOL/HDL Ratio: 3.2 Ratio
VLDL: 20 mg/dL (ref 0–40)

## 2013-01-02 NOTE — Telephone Encounter (Signed)
Pt has physical scheduled with you and would like to get bloodwork done today or tomorrow. See he needs a TSH, Lipid, PSA and CMP.  What diagnosis should I use

## 2013-01-02 NOTE — Telephone Encounter (Signed)
Labs printed.

## 2013-01-03 LAB — TSH: TSH: 1.996 u[IU]/mL (ref 0.350–4.500)

## 2013-01-03 NOTE — Telephone Encounter (Signed)
Quick Note:  All labs are normal. ______ 

## 2013-01-08 ENCOUNTER — Ambulatory Visit (INDEPENDENT_AMBULATORY_CARE_PROVIDER_SITE_OTHER): Payer: Medicare Other | Admitting: Family Medicine

## 2013-01-08 ENCOUNTER — Encounter: Payer: Self-pay | Admitting: Family Medicine

## 2013-01-08 VITALS — BP 147/77 | HR 64 | Ht 69.0 in | Wt 172.0 lb

## 2013-01-08 DIAGNOSIS — Z0000 Encounter for general adult medical examination without abnormal findings: Secondary | ICD-10-CM

## 2013-01-08 NOTE — Progress Notes (Signed)
Subjective:    Zachary Waters is a 72 y.o. male who presents for Medicare Annual/Subsequent preventive examination.   Preventive Screening-Counseling & Management  Tobacco History  Smoking status  . Former Smoker  . Types: Pipe  . Quit date: 10/07/2009  Smokeless tobacco  . Not on file    Problems Prior to Visit 1.  BPs have been running 112-123/78-82  Current Problems (verified) Patient Active Problem List   Diagnosis Date Noted  . Left shoulder, arm, and hand pain 01/06/2012  . Hypothyroid 10/20/2010  . COPD (chronic obstructive pulmonary disease) 10/20/2010    Medications Prior to Visit Current Outpatient Prescriptions on File Prior to Visit  Medication Sig Dispense Refill  . albuterol (PROAIR HFA) 108 (90 BASE) MCG/ACT inhaler Inhale 2 puffs into the lungs every 6 (six) hours as needed.        . Ascorbic Acid (VITAMIN C PO) Take by mouth.      Marland Kitchen aspirin 81 MG tablet Take 81 mg by mouth daily.        . Fish Oil OIL by Does not apply route.      Marland Kitchen levothyroxine (SYNTHROID, LEVOTHROID) 75 MCG tablet TAKE 1 TABLET EVERY DAY  30 tablet  4  . mometasone-formoterol (DULERA) 100-5 MCG/ACT AERO Inhale 2 puffs into the lungs 2 (two) times daily.      Marland Kitchen tiotropium (SPIRIVA) 18 MCG inhalation capsule Place 18 mcg into inhaler and inhale daily.       No current facility-administered medications on file prior to visit.    Current Medications (verified) Current Outpatient Prescriptions  Medication Sig Dispense Refill  . albuterol (PROAIR HFA) 108 (90 BASE) MCG/ACT inhaler Inhale 2 puffs into the lungs every 6 (six) hours as needed.        . Ascorbic Acid (VITAMIN C PO) Take by mouth.      Marland Kitchen aspirin 81 MG tablet Take 81 mg by mouth daily.        . Fish Oil OIL by Does not apply route.      Marland Kitchen levothyroxine (SYNTHROID, LEVOTHROID) 75 MCG tablet TAKE 1 TABLET EVERY DAY  30 tablet  4  . mometasone-formoterol (DULERA) 100-5 MCG/ACT AERO Inhale 2 puffs into the lungs 2 (two) times  daily.      Marland Kitchen tiotropium (SPIRIVA) 18 MCG inhalation capsule Place 18 mcg into inhaler and inhale daily.       No current facility-administered medications for this visit.     Allergies (verified) Review of patient's allergies indicates no known allergies.   PAST HISTORY  Family History Family History  Problem Relation Age of Onset  . Prostate cancer Father 44  . Depression Brother     Commited suicide  . Heart disease Mother 17    deceased age 107    Social History History  Substance Use Topics  . Smoking status: Former Smoker    Types: Pipe    Quit date: 10/07/2009  . Smokeless tobacco: Not on file  . Alcohol Use: Yes    Are there smokers in your home (other than you)?  No  Risk Factors Current exercise habits: works out daily  Dietary issues discussed: none   Cardiac risk factors: advanced age (older than 110 for men, 5 for women) and male gender.  Depression Screen (Note: if answer to either of the following is "Yes", a more complete depression screening is indicated)   Q1: Over the past two weeks, have you felt down, depressed or hopeless? No  Q2:  Over the past two weeks, have you felt little interest or pleasure in doing things? No  Have you lost interest or pleasure in daily life? No  Do you often feel hopeless? No  Do you cry easily over simple problems? No  Activities of Daily Living In your present state of health, do you have any difficulty performing the following activities?:  Driving? No Managing money?  No Feeding yourself? No Getting from bed to chair? No Climbing a flight of stairs? Yes Preparing food and eating?: No Bathing or showering? No Getting dressed: No Getting to the toilet? No Using the toilet:No Moving around from place to place: No In the past year have you fallen or had a near fall?:No   Hearing Difficulties: No Do you often ask people to speak up or repeat themselves? No Do you experience ringing or noises in your ears?  No Do you have difficulty understanding soft or whispered voices? Yes   Do you feel that you have a problem with memory? No  Do you often misplace items? No  Do you feel safe at home?  Yes  Cognitive Testing  Alert? Yes  Normal Appearance?Yes  Oriented to person? Yes  Place? Yes   Time? Yes  Recall of three objects?  Yes  Can perform simple calculations? Yes  Displays appropriate judgment?Yes  Can read the correct time from a watch face?Yes   Advanced Directives have been discussed with the patient? Yes   List the Names of Other Physician/Practitioners you currently use: 1.  Dr. Jerre Simon - Pulmonology 2.  Dr. Marko Stai - [facility] 3.  Dr. Denny Levy - Sports med.   Indicate any recent Medical Services you may have received from other than [facility] providers in the past year (date may be approximate).  Immunization History  Administered Date(s) Administered  . Pneumococcal Polysaccharide 05/09/2010  . Tdap 01/03/2012  . Zoster 10/27/2010    Screening Tests Health Maintenance  Topic Date Due  . Influenza Vaccine  12/07/2012  . Colonoscopy  05/10/2019  . Tetanus/tdap  01/02/2022  . Pneumococcal Polysaccharide Vaccine Age 54 And Over  Completed  . Zostavax  Completed    All answers were reviewed with the patient and necessary referrals were made:  METHENEY,CATHERINE, MD   01/08/2013   History reviewed: allergies, current medications, past family history, past medical history, past social history, past surgical history and problem list  Review of Systems A comprehensive review of systems was negative.    Objective:     Vision by Snellen chart: Had eye exam is UTD There were no vitals taken for this visit. There is no weight on file to calculate BMI.  BP 147/77  Pulse 64  Ht 5\' 9"  (1.753 m)  Wt 172 lb (78.019 kg)  BMI 25.39 kg/m2  General Appearance:    Alert, cooperative, no distress, appears stated age  Head:    Normocephalic, without obvious abnormality, atraumatic  Eyes:     PERRL, conjunctiva/corneas clear, EOM's intact, both eyes       Ears:    Normal TM's and external ear canals, both ears  Nose:   Nares normal, septum midline, mucosa normal, no drainage    or sinus tenderness  Throat:   Lips, mucosa, and tongue normal; teeth and gums normal  Neck:   Supple, symmetrical, trachea midline, no adenopathy;       thyroid:  No enlargement/tenderness/nodules; no carotid   bruit or JVD  Back:     Symmetric, no curvature, ROM  normal, no CVA tenderness  Lungs:     Clear to auscultation bilaterally, respirations unlabored  Chest wall:    No tenderness or deformity  Heart:    Regular rate and rhythm, S1 and S2 normal, no murmur, rub   or gallop  Abdomen:     Soft, non-tender, bowel sounds active all four quadrants,    no masses, no organomegaly  Genitalia:    Not perfromed.   Rectal:    Not performed.   Extremities:   Extremities normal, atraumatic, no cyanosis or edema  Pulses:   2+ and symmetric all extremities  Skin:   Skin color, texture, turgor normal, no rashes or lesions  Lymph nodes:   Cervical, supraclavicular, and axillary nodes normal  Neurologic:   CNII-XII intact. Normal strength, sensation and reflexes      throughout       Assessment:    Annual Medicare Wellness EXam     Plan:     During the course of the visit the patient was educated and counseled about appropriate screening and preventive services including:    Influenza vaccine  - will get a [facility]  Hypothyroid - TSh is well regulated   Given copy of labwork.   Diet review for nutrition referral? Yes ____  Not Indicated __x_   Patient Instructions (the written plan) was given to the patient.  Medicare Attestation I have personally reviewed: The patient's medical and social history Their use of alcohol, tobacco or illicit drugs Their current medications and supplements The patient's functional ability including ADLs,fall risks, home safety risks, cognitive, and hearing and  visual impairment Diet and physical activities Evidence for depression or mood disorders  The patient's weight, height, BMI, and visual acuity have been recorded in the chart.  I have made referrals, counseling, and provided education to the patient based on review of the above and I have provided the patient with a written personalized care plan for preventive services.     METHENEY,CATHERINE, MD   01/08/2013

## 2013-01-08 NOTE — Patient Instructions (Signed)
Keep up a regular exercise program and make sure you are eating a healthy diet Try to eat 4 servings of dairy a day, or if you are lactose intolerant take a calcium with vitamin D daily.  Your vaccines are up to date.   

## 2013-01-10 ENCOUNTER — Encounter: Payer: Self-pay | Admitting: Family Medicine

## 2013-02-01 ENCOUNTER — Other Ambulatory Visit: Payer: Self-pay | Admitting: Family Medicine

## 2013-02-01 ENCOUNTER — Other Ambulatory Visit: Payer: Self-pay | Admitting: *Deleted

## 2013-02-01 ENCOUNTER — Encounter: Payer: Self-pay | Admitting: Family Medicine

## 2013-02-01 MED ORDER — LEVOTHYROXINE SODIUM 75 MCG PO TABS: ORAL_TABLET | ORAL | Status: DC

## 2013-02-12 ENCOUNTER — Emergency Department (INDEPENDENT_AMBULATORY_CARE_PROVIDER_SITE_OTHER)
Admission: EM | Admit: 2013-02-12 | Discharge: 2013-02-12 | Disposition: A | Discharge status: Home / Self Care | Payer: Medicare Other | Source: Home / Self Care

## 2013-02-12 DIAGNOSIS — Z0000 Encounter for general adult medical examination without abnormal findings: Secondary | ICD-10-CM

## 2013-02-12 DIAGNOSIS — Z23 Encounter for immunization: Secondary | ICD-10-CM

## 2013-02-12 MED ORDER — INFLUENZA VAC SPLIT QUAD 0.5 ML IM SUSP
0.5000 mL | Freq: Once | INTRAMUSCULAR | Status: AC
  Administered 2013-02-12: 0.5 mL via INTRAMUSCULAR

## 2013-02-27 ENCOUNTER — Other Ambulatory Visit: Payer: Self-pay | Admitting: Family Medicine

## 2013-03-19 LAB — PULMONARY FUNCTION TEST

## 2013-04-26 ENCOUNTER — Other Ambulatory Visit: Payer: Self-pay | Admitting: Family Medicine

## 2013-05-28 ENCOUNTER — Other Ambulatory Visit: Payer: Self-pay | Admitting: Family Medicine

## 2013-05-28 DIAGNOSIS — M5010 Cervical disc disorder with radiculopathy, unspecified cervical region: Secondary | ICD-10-CM

## 2013-05-28 DIAGNOSIS — M25512 Pain in left shoulder: Secondary | ICD-10-CM

## 2013-07-02 ENCOUNTER — Encounter: Payer: Self-pay | Admitting: Family Medicine

## 2013-07-02 ENCOUNTER — Other Ambulatory Visit: Payer: Self-pay | Admitting: *Deleted

## 2013-07-02 MED ORDER — LEVOTHYROXINE SODIUM 75 MCG PO TABS: ORAL_TABLET | ORAL | Status: DC

## 2013-07-30 ENCOUNTER — Encounter: Payer: Self-pay | Admitting: Family Medicine

## 2013-08-01 ENCOUNTER — Telehealth: Payer: Self-pay | Admitting: Family Medicine

## 2013-08-01 ENCOUNTER — Encounter: Payer: Self-pay | Admitting: Family Medicine

## 2013-08-01 DIAGNOSIS — I10 Essential (primary) hypertension: Secondary | ICD-10-CM

## 2013-08-01 DIAGNOSIS — E039 Hypothyroidism, unspecified: Secondary | ICD-10-CM

## 2013-08-01 DIAGNOSIS — E785 Hyperlipidemia, unspecified: Secondary | ICD-10-CM

## 2013-08-01 NOTE — Telephone Encounter (Signed)
Pt informed.Zachary Waters Lynetta  

## 2013-08-01 NOTE — Telephone Encounter (Signed)
Pt called. He has an appt on Tuesday and wants labs done prior.  Please call him and let himknow when he can pick up Order. Thank you.

## 2013-08-02 LAB — COMPLETE METABOLIC PANEL WITH GFR
ALBUMIN: 4.1 g/dL (ref 3.5–5.2)
ALT: 16 U/L (ref 0–53)
AST: 18 U/L (ref 0–37)
Alkaline Phosphatase: 53 U/L (ref 39–117)
BUN: 12 mg/dL (ref 6–23)
CALCIUM: 8.9 mg/dL (ref 8.4–10.5)
CHLORIDE: 102 meq/L (ref 96–112)
CO2: 28 mEq/L (ref 19–32)
CREATININE: 1.03 mg/dL (ref 0.50–1.35)
GFR, EST AFRICAN AMERICAN: 85 mL/min
GFR, Est Non African American: 73 mL/min
GLUCOSE: 88 mg/dL (ref 70–99)
Potassium: 5 mEq/L (ref 3.5–5.3)
Sodium: 139 mEq/L (ref 135–145)
Total Bilirubin: 0.6 mg/dL (ref 0.2–1.2)
Total Protein: 6.1 g/dL (ref 6.0–8.3)

## 2013-08-02 LAB — LIPID PANEL
CHOLESTEROL: 184 mg/dL (ref 0–200)
HDL: 54 mg/dL (ref >39)
LDL Cholesterol: 112 mg/dL — ABNORMAL HIGH (ref 0–99)
TRIGLYCERIDES: 89 mg/dL (ref <150)
Total CHOL/HDL Ratio: 3.4 Ratio
VLDL: 18 mg/dL (ref 0–40)

## 2013-08-03 LAB — TSH: TSH: 2.671 u[IU]/mL (ref 0.350–4.500)

## 2013-08-06 ENCOUNTER — Ambulatory Visit (INDEPENDENT_AMBULATORY_CARE_PROVIDER_SITE_OTHER): Payer: Medicare Other | Admitting: Family Medicine

## 2013-08-06 ENCOUNTER — Encounter: Payer: Self-pay | Admitting: Family Medicine

## 2013-08-06 VITALS — BP 129/69 | HR 70 | Wt 178.0 lb

## 2013-08-06 DIAGNOSIS — Z23 Encounter for immunization: Secondary | ICD-10-CM

## 2013-08-06 DIAGNOSIS — E039 Hypothyroidism, unspecified: Secondary | ICD-10-CM

## 2013-08-06 DIAGNOSIS — J449 Chronic obstructive pulmonary disease, unspecified: Secondary | ICD-10-CM

## 2013-08-06 MED ORDER — LEVOTHYROXINE SODIUM 75 MCG PO TABS: ORAL_TABLET | ORAL | Status: DC

## 2013-08-06 NOTE — Addendum Note (Signed)
Addended by: Teddy Spike on: 08/06/2013 04:55 PM   Modules accepted: Orders

## 2013-08-06 NOTE — Progress Notes (Signed)
   Subjective:    Patient ID: Zachary Waters, male    DOB: [birth year], 73 y.o.   MRN: [id number]  HPI Here for followup of hypothyroidism- no skin or hair changes. No weight changes.  Lab Results  Component Value Date   TSH 2.671 08/01/2013   COPD-has been stable. He continues to exercise regularly. He is very consistent with his inhalers. He is not in any recent flares or exacerbations.   Review of Systems     Objective:   Physical Exam  Constitutional: He is oriented to person, place, and time. He appears well-developed and well-nourished.  HENT:  Head: Normocephalic and atraumatic.  Neck: Neck supple. No thyromegaly present.  Cardiovascular: Normal rate, regular rhythm and normal heart sounds.   Pulmonary/Chest: Effort normal and breath sounds normal.  Lymphadenopathy:    He has no cervical adenopathy.  Neurological: He is alert and oriented to person, place, and time.  Skin: Skin is warm and dry.  Psychiatric: He has a normal mood and affect. His behavior is normal.          Assessment & Plan:  Hypothyroid - Well controlled.  REviewed labs. F/U in 1 years.  Will refill medication.  COPD - stable. No recent exacerbations. Followed by pulmonology.  Discussed need for Prevnar. Given today.

## 2013-08-30 ENCOUNTER — Encounter: Payer: Self-pay | Admitting: Family Medicine

## 2013-08-30 ENCOUNTER — Other Ambulatory Visit: Payer: Self-pay | Admitting: Family Medicine

## 2013-09-19 ENCOUNTER — Encounter: Payer: Self-pay | Admitting: Family Medicine

## 2013-09-20 ENCOUNTER — Encounter: Payer: Self-pay | Admitting: Family Medicine

## 2013-09-20 ENCOUNTER — Ambulatory Visit (INDEPENDENT_AMBULATORY_CARE_PROVIDER_SITE_OTHER): Payer: Medicare Other | Admitting: Family Medicine

## 2013-09-20 ENCOUNTER — Ambulatory Visit (INDEPENDENT_AMBULATORY_CARE_PROVIDER_SITE_OTHER): Payer: Medicare Other

## 2013-09-20 VITALS — BP 177/94 | HR 67 | Wt 171.0 lb

## 2013-09-20 DIAGNOSIS — R1013 Epigastric pain: Secondary | ICD-10-CM

## 2013-09-20 DIAGNOSIS — R1011 Right upper quadrant pain: Secondary | ICD-10-CM

## 2013-09-20 DIAGNOSIS — R110 Nausea: Secondary | ICD-10-CM

## 2013-09-20 LAB — COMPLETE METABOLIC PANEL WITH GFR
ALT: 13 U/L (ref 0–53)
AST: 18 U/L (ref 0–37)
Albumin: 4.6 g/dL (ref 3.5–5.2)
Alkaline Phosphatase: 65 U/L (ref 39–117)
BILIRUBIN TOTAL: 0.7 mg/dL (ref 0.2–1.2)
BUN: 14 mg/dL (ref 6–23)
CO2: 26 mEq/L (ref 19–32)
CREATININE: 0.9 mg/dL (ref 0.50–1.35)
Calcium: 9.1 mg/dL (ref 8.4–10.5)
Chloride: 100 mEq/L (ref 96–112)
GFR, Est African American: >89 mL/min
GFR, Est Non African American: 86 mL/min
Glucose, Bld: 90 mg/dL (ref 70–99)
Potassium: 4.1 mEq/L (ref 3.5–5.3)
Sodium: 138 mEq/L (ref 135–145)
Total Protein: 6.9 g/dL (ref 6.0–8.3)

## 2013-09-20 LAB — CBC WITH DIFFERENTIAL/PLATELET
Basophils Absolute: 0 10*3/uL (ref 0.0–0.1)
Basophils Relative: 0 % (ref 0–1)
EOS ABS: 0 10*3/uL (ref 0.0–0.7)
EOS PCT: 0 % (ref 0–5)
HCT: 46.1 % (ref 39.0–52.0)
Hemoglobin: 16.1 g/dL (ref 13.0–17.0)
LYMPHS PCT: 10 % — AB (ref 12–46)
Lymphs Abs: 0.8 10*3/uL (ref 0.7–4.0)
MCH: 30.7 pg (ref 26.0–34.0)
MCHC: 34.9 g/dL (ref 30.0–36.0)
MCV: 88 fL (ref 78.0–100.0)
Monocytes Absolute: 0.6 10*3/uL (ref 0.1–1.0)
Monocytes Relative: 8 % (ref 3–12)
Neutro Abs: 6.2 10*3/uL (ref 1.7–7.7)
Neutrophils Relative %: 82 % — ABNORMAL HIGH (ref 43–77)
PLATELETS: 321 10*3/uL (ref 150–400)
RBC: 5.24 MIL/uL (ref 4.22–5.81)
RDW: 13.5 % (ref 11.5–15.5)
WBC: 7.5 10*3/uL (ref 4.0–10.5)

## 2013-09-20 LAB — LIPASE: LIPASE: 11 U/L (ref 0–75)

## 2013-09-20 LAB — AMYLASE: Amylase: 40 U/L (ref 0–105)

## 2013-09-20 MED ORDER — GI COCKTAIL ~~LOC~~
30.0000 mL | Freq: Once | ORAL | Status: AC
  Administered 2013-09-20: 30 mL via ORAL

## 2013-09-20 MED ORDER — GI COCKTAIL ~~LOC~~: 30.0000 mL | Freq: Once | ORAL | Status: DC

## 2013-09-20 NOTE — Patient Instructions (Signed)
Start Prilosec or Nexium once a day about 30 min before the first meal of the day to turn off the acid pumps in the stomach

## 2013-09-20 NOTE — Progress Notes (Signed)
Subjective:    Patient ID: Zachary Waters, male    DOB: 1940/07/12, 73 y.o.   MRN: [id number]  HPI          Abdominal Pain sxs began tuesday. pt reports that the pain is in the epigastric area and feels that if he could "vomit" he would feel better he has a sensation of feeling full although he has not had anything to eat this morning    Sxs x 4 days. Had some heartburn about 1 month ago, but only happened a couple of times. Never took anything for it. Then 4 days ago, Did notice that after eating he started to feel very nauseated. The most of the point of vomiting.   Feels like something is sitting in the epigastric pain. He feels like if he could just take it out he would feel better.  Wanted to vomit to feel better and couldn't.  Does feel like food is getting stuck in lower esophageal area. No pain over the upper chest. No problems swallowing. A sensation of food getting stuck in the upper throat or upper chest. No constipation or diarrhea. No blood in the stool. Chills or sweats. He has not tried any heartburn or reflux medication. He says yesterday and the nausea was so intense after eating a cup of yogurt that he dropped to his knees while talking to a neighbor because he thought he was going to vomit.   Review of Systems  BP 177/94  Pulse 67  Wt 171 lb (77.565 kg)  SpO2 96%    No Known Allergies  Past Medical History  Diagnosis Date  . Pneumonia 10/2009  . COPD (chronic obstructive pulmonary disease)   . Thyroid disease     Past Surgical History  Procedure Laterality Date  . Rotator cuff repair  2005    Left  . Tonsillectomy    . Leg surgery Right     History   Social History  . Marital Status: Married    Spouse Name: N/A    Number of Children: 2  . Years of Education: N/A   Occupational History  . retired    Social History Main Topics  . Smoking status: Former Smoker    Types: Pipe    Quit date: 10/07/2009  . Smokeless tobacco: Not on file  . Alcohol Use:  Yes  . Drug Use: No  . Sexual Activity: Not on file   Other Topics Concern  . Not on file   Social History Narrative   Power walks 3 days per week for 2 miles. Golfs twice a week.     Family History  Problem Relation Age of Onset  . Prostate cancer Father 80  . Depression Brother     Commited suicide  . Heart disease Mother 18    deceased age 66    Outpatient Encounter Prescriptions as of 09/20/2013  Medication Sig  . albuterol (PROAIR HFA) 108 (90 BASE) MCG/ACT inhaler Inhale 2 puffs into the lungs every 6 (six) hours as needed.    . Ascorbic Acid (VITAMIN C PO) Take by mouth.  Marland Kitchen aspirin 81 MG tablet Take 81 mg by mouth daily.    . budesonide-formoterol (SYMBICORT) 160-4.5 MCG/ACT inhaler Inhale 2 puffs into the lungs 2 (two) times daily.  . Fish Oil OIL by Does not apply route.  Marland Kitchen levothyroxine (SYNTHROID, LEVOTHROID) 75 MCG tablet TAKE 1 TABLET EVERY DAY  . QUERCETIN PO Take 2 tablets by mouth 2 (two) times daily.  Marland Kitchen  tiotropium (SPIRIVA) 18 MCG inhalation capsule Place 18 mcg into inhaler and inhale daily.  . [DISCONTINUED] Alum & Mag Hydroxide-Simeth (GI COCKTAIL) SUSP suspension Take 30 mLs by mouth once. Shake well.  . [EXPIRED] gi cocktail (Maalox,Lidocaine,Donnatal)           Objective:   Physical Exam  Constitutional: He is oriented to person, place, and time. He appears well-developed and well-nourished.  HENT:  Head: Normocephalic and atraumatic.  Cardiovascular: Normal rate, regular rhythm and normal heart sounds.   Pulmonary/Chest: Effort normal and breath sounds normal.  Abdominal: Soft. Bowel sounds are normal. He exhibits no distension and no mass. There is no tenderness. There is no rebound and no guarding.  Neurological: He is alert and oriented to person, place, and time.  Skin: Skin is warm and dry.  Psychiatric: He has a normal mood and affect. His behavior is normal.          Assessment & Plan:  Epigastric pain-unclear etiology but  suspicious for gastritis versus gallbladder disease. I did do an EKG 73 years old with COPD.  EKG showed normal sinus rhythm, no abnormal axis. No significant ST-T wave elevations. Rate of 59 beats per minute. All schedule him for an ultrasound today to evaluate gallbladder. Also start him on a proton inhibitor for gastritis/reflux symptoms. Recommend that he stop all caffeine products including coffee. He did have coffee this morning. No greasy or spicy foods. If he gets worse, starts vomiting, or spikes a fever then a prescription to the emergency department. We'll also check a CBC and a CMP as well as a thyroid pancreatic enzymes.

## 2013-09-23 ENCOUNTER — Encounter: Payer: Self-pay | Admitting: Family Medicine

## 2013-10-02 ENCOUNTER — Encounter: Payer: Self-pay | Admitting: *Deleted

## 2013-10-15 ENCOUNTER — Encounter: Payer: Self-pay | Admitting: Family Medicine

## 2013-10-15 ENCOUNTER — Other Ambulatory Visit: Payer: Self-pay | Admitting: Family Medicine

## 2013-10-15 DIAGNOSIS — R05 Cough: Secondary | ICD-10-CM

## 2013-10-15 DIAGNOSIS — R058 Other specified cough: Secondary | ICD-10-CM

## 2013-10-16 ENCOUNTER — Ambulatory Visit (INDEPENDENT_AMBULATORY_CARE_PROVIDER_SITE_OTHER): Payer: Medicare Other

## 2013-10-16 DIAGNOSIS — R05 Cough: Secondary | ICD-10-CM

## 2013-10-16 DIAGNOSIS — R058 Other specified cough: Secondary | ICD-10-CM

## 2013-10-16 DIAGNOSIS — J449 Chronic obstructive pulmonary disease, unspecified: Secondary | ICD-10-CM

## 2013-10-17 ENCOUNTER — Other Ambulatory Visit: Payer: Self-pay | Admitting: Family Medicine

## 2013-10-17 MED ORDER — DOXYCYCLINE HYCLATE 100 MG PO TABS: 100.0000 mg | ORAL_TABLET | Freq: Two times a day (BID) | ORAL | Status: DC

## 2013-10-29 ENCOUNTER — Encounter: Payer: Self-pay | Admitting: Family Medicine

## 2013-11-06 ENCOUNTER — Encounter: Payer: Self-pay | Admitting: Family Medicine

## 2013-12-24 ENCOUNTER — Other Ambulatory Visit: Payer: Self-pay | Admitting: Family Medicine

## 2013-12-24 ENCOUNTER — Encounter: Payer: Self-pay | Admitting: Family Medicine

## 2013-12-24 DIAGNOSIS — Z125 Encounter for screening for malignant neoplasm of prostate: Secondary | ICD-10-CM

## 2013-12-29 ENCOUNTER — Encounter: Payer: Self-pay | Admitting: Family Medicine

## 2013-12-30 NOTE — Telephone Encounter (Signed)
Patient aware we do not have records from 2011.

## 2014-01-01 LAB — PSA: PSA: 1.18 ng/mL (ref <=4.00)

## 2014-01-09 ENCOUNTER — Ambulatory Visit (INDEPENDENT_AMBULATORY_CARE_PROVIDER_SITE_OTHER): Payer: Medicare Other | Admitting: Family Medicine

## 2014-01-09 ENCOUNTER — Encounter: Payer: Self-pay | Admitting: Family Medicine

## 2014-01-09 VITALS — BP 126/70 | HR 61 | Wt 169.0 lb

## 2014-01-09 DIAGNOSIS — Z0000 Encounter for general adult medical examination without abnormal findings: Secondary | ICD-10-CM

## 2014-01-09 DIAGNOSIS — Z23 Encounter for immunization: Secondary | ICD-10-CM

## 2014-01-09 NOTE — Progress Notes (Signed)
Subjective:    Zachary Waters is a 73 y.o. male who presents for Medicare Annual/Subsequent preventive examination.   Preventive Screening-Counseling & Management  Tobacco History  Smoking status  . Former Smoker  . Types: Pipe  . Quit date: 10/07/2009  Smokeless tobacco  . Not on file    Problems Prior to Visit 1. None.   Current Problems (verified) Patient Active Problem List   Diagnosis Date Noted  . Cervical disc disorder with radiculopathy of cervical region 05/28/2013  . Left shoulder, arm, and hand pain 01/06/2012  . Hypothyroid 10/20/2010  . COPD (chronic obstructive pulmonary disease) 10/20/2010    Medications Prior to Visit Current Outpatient Prescriptions on File Prior to Visit  Medication Sig Dispense Refill  . albuterol (PROAIR HFA) 108 (90 BASE) MCG/ACT inhaler Inhale 2 puffs into the lungs every 6 (six) hours as needed.        . Ascorbic Acid (VITAMIN C PO) Take by mouth.      . budesonide-formoterol (SYMBICORT) 160-4.5 MCG/ACT inhaler Inhale 2 puffs into the lungs 2 (two) times daily.      . Fish Oil OIL by Does not apply route.      Marland Kitchen levothyroxine (SYNTHROID, LEVOTHROID) 75 MCG tablet TAKE 1 TABLET EVERY DAY  30 tablet  10  . QUERCETIN PO Take 2 tablets by mouth 2 (two) times daily.      Marland Kitchen tiotropium (SPIRIVA) 18 MCG inhalation capsule Place 18 mcg into inhaler and inhale daily.      Marland Kitchen aspirin 81 MG tablet Take 81 mg by mouth daily.         No current facility-administered medications on file prior to visit.    Current Medications (verified) Current Outpatient Prescriptions  Medication Sig Dispense Refill  . albuterol (PROAIR HFA) 108 (90 BASE) MCG/ACT inhaler Inhale 2 puffs into the lungs every 6 (six) hours as needed.        . Ascorbic Acid (VITAMIN C PO) Take by mouth.      . budesonide-formoterol (SYMBICORT) 160-4.5 MCG/ACT inhaler Inhale 2 puffs into the lungs 2 (two) times daily.      . Fish Oil OIL by Does not apply route.      Marland Kitchen  levothyroxine (SYNTHROID, LEVOTHROID) 75 MCG tablet TAKE 1 TABLET EVERY DAY  30 tablet  10  . QUERCETIN PO Take 2 tablets by mouth 2 (two) times daily.      Marland Kitchen tiotropium (SPIRIVA) 18 MCG inhalation capsule Place 18 mcg into inhaler and inhale daily.      Marland Kitchen aspirin 81 MG tablet Take 81 mg by mouth daily.         No current facility-administered medications for this visit.     Allergies (verified) Review of patient's allergies indicates no known allergies.   PAST HISTORY  Family History Family History  Problem Relation Age of Onset  . Prostate cancer Zachary Waters 30  . Depression Zachary Waters     Commited suicide  . Heart disease Zachary Waters 42    deceased age 63    Social History History  Substance Use Topics  . Smoking status: Former Smoker    Types: Pipe    Quit date: 10/07/2009  . Smokeless tobacco: Not on file  . Alcohol Use: Yes    Are there smokers in your home (other than you)?  No  Risk Factors Current exercise habits: exercises 4 days per week  Dietary issues discussed: none   Cardiac risk factors: advanced age (older than 41 for men, 65  for women) and male gender.  Depression Screen (Note: if answer to either of the following is "Yes", a more complete depression screening is indicated)   Q1: Over the past two weeks, have you felt down, depressed or hopeless? No  Q2: Over the past two weeks, have you felt little interest or pleasure in doing things? No  Have you lost interest or pleasure in daily life? No  Do you often feel hopeless? No  Do you cry easily over simple problems? No  Activities of Daily Living In your present state of health, do you have any difficulty performing the following activities?:  Driving? No Managing money?  No Feeding yourself? No Getting from bed to chair? No Climbing a flight of stairs? Yes, bc of knee pain  Preparing food and eating?: No Bathing or showering? No Getting dressed: No Getting  to the toilet? No Using the toilet:No Moving  around from place to place: No In the past year have you fallen or had a near fall?:No   Hearing Difficulties: Yes Do you often ask people to speak up or repeat themselves? Yes Do you experience ringing or noises in your ears? No Do you have difficulty understanding soft or whispered voices? Yes   Do you feel that you have a problem with memory? No  Do you often misplace items? No  Do you feel safe at home?  Yes  Cognitive Testing  Alert? Yes  Normal Appearance?Yes  Oriented to person? Yes  Place? Yes   Time? Yes  Recall of three objects?  Yes  Can perform simple calculations? Yes  Displays appropriate judgment?Yes  Can read the correct time from a watch face?Yes   Advanced Directives have been discussed with the patient? Yes   List the Names of Other Physician/Practitioners you currently use: 1.  Dr. Michela Waters   Indicate any recent Medical Services you may have received from other than [facility] providers in the past year (date may be approximate).  Immunization History  Administered Date(s) Administered  . Influenza,inj,Quad PF,36+ Mos 02/12/2013  . Pneumococcal Conjugate-13 08/06/2013  . Pneumococcal Polysaccharide-23 05/09/2010  . Tdap 01/03/2012  . Zoster 10/27/2010    Screening Tests Health Maintenance  Topic Date Due  . Influenza Vaccine  12/07/2013  . Colonoscopy  05/09/2016  . Tetanus/tdap  01/02/2022  . Pneumococcal Polysaccharide Vaccine Age 14 And Over  Completed  . Zostavax  Completed    All answers were reviewed with the patient and necessary referrals were made:  Zachary Kasparek, MD   01/09/2014   History reviewed: allergies, current medications, past family history, past medical history, past social history, past surgical history and problem list  Review of Systems A comprehensive review of systems was negative.    Objective:     Vision by Snellen chart: right eye: will call to get eye reports.   Blood pressure 126/70, pulse 61, weight 169 lb  (76.658 kg). Body mass index is 24.95 kg/(m^2).  BP 126/70  Pulse 61  Wt 169 lb (76.658 kg) General appearance: alert, cooperative and appears stated age Head: Normocephalic, without obvious abnormality, atraumatic Eyes: conj clear, EOMI, PEERLA Ears: normal TM's and external ear canals both ears Nose: Nares normal. Septum midline. Mucosa normal. No drainage or sinus tenderness. Throat: lips, mucosa, and tongue normal; teeth and gums normal, dentures Neck: no adenopathy, no carotid bruit, no JVD, supple, symmetrical, trachea midline and thyroid not enlarged, symmetric, no tenderness/mass/nodules Back: symmetric, no curvature. ROM normal. No CVA tenderness. Lungs: clear to auscultation  bilaterally Chest wall: no tenderness Heart: regular rate and rhythm, S1, S2 normal, no murmur, click, rub or gallop Abdomen: soft, non-tender; bowel sounds normal; no masses,  no organomegaly Extremities: extremities normal, atraumatic, no cyanosis or edema Pulses: 2+ and symmetric Skin: Skin color, texture, turgor normal. No rashes or lesions Lymph nodes: Cervical, supraclavicular, and axillary nodes normal. Neurologic: Alert and oriented X 3, normal strength and tone. Normal symmetric reflexes. Normal coordination and gait     Assessment:    Annual Medicare Wellness Exam      Plan:     During the course of the visit the patient was educated and counseled about appropriate screening and preventive services including:    Influenza vaccine  Vaccines are UTD.   Diet review for nutrition referral? Yes ____  Not Indicated _X __   Patient Instructions (the written plan) was given to the patient.  Medicare Attestation I have personally reviewed: The patient's medical and social history Their use of alcohol, tobacco or illicit drugs Their current medications and supplements The patient's functional ability including ADLs,fall risks, home safety risks, cognitive, and hearing and visual  impairment Diet and physical activities Evidence for depression or mood disorders  The patient's weight, height, BMI, and visual acuity have been recorded in the chart.  I have made referrals, counseling, and provided education to the patient based on review of the above and I have provided the patient with a written personalized care plan for preventive services.     Frederik Standley, MD   01/09/2014

## 2014-01-09 NOTE — Patient Instructions (Signed)
Keep up a regular exercise program and make sure you are eating a healthy diet Try to eat 4 servings of dairy a day, or if you are lactose intolerant take a calcium with vitamin D daily.  Your vaccines are up to date.   

## 2014-01-10 ENCOUNTER — Other Ambulatory Visit: Payer: Self-pay | Admitting: Family Medicine

## 2014-01-10 MED ORDER — TIOTROPIUM BROMIDE MONOHYDRATE 18 MCG IN CAPS: 18.0000 ug | ORAL_CAPSULE | Freq: Every day | RESPIRATORY_TRACT | Status: DC

## 2014-03-04 ENCOUNTER — Encounter: Payer: Self-pay | Admitting: Family Medicine

## 2014-05-21 ENCOUNTER — Other Ambulatory Visit: Payer: Self-pay | Admitting: *Deleted

## 2014-05-21 MED ORDER — LEVOTHYROXINE SODIUM 75 MCG PO TABS: ORAL_TABLET | ORAL | Status: DC

## 2014-06-29 ENCOUNTER — Other Ambulatory Visit: Payer: Self-pay

## 2014-06-29 ENCOUNTER — Encounter: Payer: Self-pay | Admitting: Family Medicine

## 2014-06-29 MED ORDER — LEVOTHYROXINE SODIUM 75 MCG PO TABS: ORAL_TABLET | ORAL | Status: DC

## 2014-06-30 ENCOUNTER — Telehealth: Payer: Self-pay

## 2014-06-30 DIAGNOSIS — Z13220 Encounter for screening for lipoid disorders: Secondary | ICD-10-CM

## 2014-06-30 DIAGNOSIS — E039 Hypothyroidism, unspecified: Secondary | ICD-10-CM

## 2014-06-30 NOTE — Telephone Encounter (Signed)
Zachary Waters is requesting labs. I see he is due for smp, cholesterol and TSH are there other labs needed. Also, does he need to follow up with an office visit or just return in September for his yearly?

## 2014-06-30 NOTE — Telephone Encounter (Signed)
Okay to be seen in September. I think pulmonology is still managing his COPD. Okay for CMP and TSH. Do not need to repeat cholesterol until August.

## 2014-07-02 NOTE — Telephone Encounter (Signed)
Last lipid was March 2015.

## 2014-07-03 NOTE — Telephone Encounter (Signed)
Sorry, I missed that. I think it was looking at the lipase. Yes please, let's add the lipid panel some blood work as well.

## 2014-07-10 DIAGNOSIS — E039 Hypothyroidism, unspecified: Secondary | ICD-10-CM | POA: Diagnosis not present

## 2014-07-10 DIAGNOSIS — Z13220 Encounter for screening for lipoid disorders: Secondary | ICD-10-CM | POA: Diagnosis not present

## 2014-07-10 NOTE — Telephone Encounter (Signed)
Patient advised. He will come in for labs soon. He will also call to schedule an office visit for September.

## 2014-07-12 LAB — COMPLETE METABOLIC PANEL WITH GFR
ALT: 26 U/L (ref 0–53)
AST: 23 U/L (ref 0–37)
Albumin: 4.5 g/dL (ref 3.5–5.2)
Alkaline Phosphatase: 62 U/L (ref 39–117)
BUN: 17 mg/dL (ref 6–23)
CO2: 29 meq/L (ref 19–32)
Calcium: 8.9 mg/dL (ref 8.4–10.5)
Chloride: 100 mEq/L (ref 96–112)
Creat: 1.02 mg/dL (ref 0.50–1.35)
GFR, EST AFRICAN AMERICAN: 85 mL/min
GFR, Est Non African American: 74 mL/min
Glucose, Bld: 80 mg/dL (ref 70–99)
POTASSIUM: 5.4 meq/L — AB (ref 3.5–5.3)
SODIUM: 139 meq/L (ref 135–145)
TOTAL PROTEIN: 6.5 g/dL (ref 6.0–8.3)
Total Bilirubin: 0.6 mg/dL (ref 0.2–1.2)

## 2014-07-12 LAB — LIPID PANEL
Cholesterol: 197 mg/dL (ref 0–200)
HDL: 61 mg/dL (ref >=40)
LDL CALC: 113 mg/dL — AB (ref 0–99)
Total CHOL/HDL Ratio: 3.2 Ratio
Triglycerides: 113 mg/dL (ref <150)
VLDL: 23 mg/dL (ref 0–40)

## 2014-07-12 LAB — TSH: TSH: 3.318 u[IU]/mL (ref 0.350–4.500)

## 2014-07-14 ENCOUNTER — Other Ambulatory Visit: Payer: Self-pay | Admitting: *Deleted

## 2014-07-14 DIAGNOSIS — E039 Hypothyroidism, unspecified: Secondary | ICD-10-CM

## 2014-07-15 ENCOUNTER — Other Ambulatory Visit: Payer: Self-pay | Admitting: *Deleted

## 2014-07-15 MED ORDER — LEVOTHYROXINE SODIUM 75 MCG PO TABS: ORAL_TABLET | ORAL | Status: DC

## 2014-07-16 ENCOUNTER — Other Ambulatory Visit: Payer: Self-pay | Admitting: Family Medicine

## 2014-08-12 DIAGNOSIS — R0602 Shortness of breath: Secondary | ICD-10-CM | POA: Diagnosis not present

## 2014-08-12 DIAGNOSIS — J449 Chronic obstructive pulmonary disease, unspecified: Secondary | ICD-10-CM | POA: Diagnosis not present

## 2014-08-15 ENCOUNTER — Encounter: Payer: Self-pay | Admitting: Family Medicine

## 2014-08-19 DIAGNOSIS — J449 Chronic obstructive pulmonary disease, unspecified: Secondary | ICD-10-CM | POA: Diagnosis not present

## 2014-08-19 DIAGNOSIS — R0602 Shortness of breath: Secondary | ICD-10-CM | POA: Diagnosis not present

## 2014-08-19 DIAGNOSIS — I517 Cardiomegaly: Secondary | ICD-10-CM | POA: Diagnosis not present

## 2014-09-15 ENCOUNTER — Ambulatory Visit (INDEPENDENT_AMBULATORY_CARE_PROVIDER_SITE_OTHER): Payer: Medicare Other | Admitting: Family Medicine

## 2014-09-15 ENCOUNTER — Encounter: Payer: Self-pay | Admitting: Family Medicine

## 2014-09-15 VITALS — BP 130/88 | HR 64 | Wt 178.0 lb

## 2014-09-15 DIAGNOSIS — S80812A Abrasion, left lower leg, initial encounter: Secondary | ICD-10-CM | POA: Diagnosis not present

## 2014-09-15 DIAGNOSIS — L089 Local infection of the skin and subcutaneous tissue, unspecified: Secondary | ICD-10-CM

## 2014-09-15 MED ORDER — SULFAMETHOXAZOLE-TRIMETHOPRIM 800-160 MG PO TABS: 1.0000 | ORAL_TABLET | Freq: Two times a day (BID) | ORAL | Status: DC

## 2014-09-15 NOTE — Progress Notes (Signed)
   Subjective:    Patient ID: Zachary Waters, male    DOB: 05/18/1940, 74 y.o.   MRN: [id number]  HPI  Lesion on the left shin x 2-3 cuts. Says started at a cut.  Hasn't healed but has been picking it. Has been using peroxide and neosporin. Never saw any pus or drinage. It is tender to touch.    Review of Systems     Objective:   Physical Exam  Constitutional: He is oriented to person, place, and time. He appears well-developed and well-nourished.  Musculoskeletal:  Left loewr shin with a 1.2 cm x 1.2 wound in the center with a scab. Some scale around the edge. approx 1 cm of erythema around the edge that fades.  Some induration around the edges.    Neurological: He is alert and oriented to person, place, and time.  Skin: Skin is warm and dry.  Psychiatric: He has a normal mood and affect. His behavior is normal.          Assessment & Plan:  Lesion left shin- with surrounding erythema does look like it's possibly infected. We'll go ahead and put him on Bactrim to cover for MRSA. Avoid picking or squeezing or removing the scab at the center. Stop using any other products such as peroxide or Neosporin. Apply Vaseline twice a day. Do not need to keep it covered. Follow-up in about 10 days to recheck. If not healing or improving at that time then recommend shave biopsy for further evaluation.

## 2014-09-15 NOTE — Patient Instructions (Signed)
Apply vaseline twice a day and NO PICKING!

## 2014-09-23 DIAGNOSIS — R0602 Shortness of breath: Secondary | ICD-10-CM | POA: Diagnosis not present

## 2014-09-23 DIAGNOSIS — J449 Chronic obstructive pulmonary disease, unspecified: Secondary | ICD-10-CM | POA: Diagnosis not present

## 2014-09-25 ENCOUNTER — Encounter: Payer: Self-pay | Admitting: Family Medicine

## 2014-09-25 ENCOUNTER — Ambulatory Visit (INDEPENDENT_AMBULATORY_CARE_PROVIDER_SITE_OTHER): Payer: Medicare Other | Admitting: Family Medicine

## 2014-09-25 VITALS — BP 137/76 | HR 68 | Ht 69.0 in | Wt 174.0 lb

## 2014-09-25 DIAGNOSIS — L089 Local infection of the skin and subcutaneous tissue, unspecified: Secondary | ICD-10-CM

## 2014-09-25 DIAGNOSIS — S80812S Abrasion, left lower leg, sequela: Secondary | ICD-10-CM | POA: Diagnosis not present

## 2014-09-25 NOTE — Progress Notes (Signed)
   Subjective:    Patient ID: Zachary Waters, male    DOB: Feb 10, 1941, 74 y.o.   MRN: [id number]  HPI  F/u lesion on the left lower leg.  Pain and sensitivity is much better. The scab in the center has gotten much smaller. He completed his last antibiotic tablet this morning and he has been only applying Vaseline. He is trying to avoid picking and scratching at it.  Review of Systems     Objective:   Physical Exam  Constitutional: He appears well-developed and well-nourished.  HENT:  Head: Normocephalic and atraumatic.  Skin: Skin is warm and dry.  Left lower shin he has an half a centimeters scab with a little bit of raised area about 3-4 mm around the edge of the scab. He does still has some erythema about 0.5-1 cm from the border which fades.  Psychiatric: He has a normal mood and affect. His behavior is normal.          Assessment & Plan:  Cellulitis/lesion left leg-resolved. Overall the lesion looks much better. The scab in the center is measuring only about half a centimeter with a little bit of raised area underneath. Still some surrounding erythema but I would say the wound is at least 50% better. Continue to follow over the next week or 2. If not healing completely then recommend biopsy.

## 2014-10-14 ENCOUNTER — Encounter: Payer: Self-pay | Admitting: Family Medicine

## 2014-10-17 ENCOUNTER — Ambulatory Visit (INDEPENDENT_AMBULATORY_CARE_PROVIDER_SITE_OTHER): Payer: Medicare Other | Admitting: Family Medicine

## 2014-10-17 ENCOUNTER — Encounter: Payer: Self-pay | Admitting: Family Medicine

## 2014-10-17 VITALS — BP 135/83 | HR 64 | Ht 69.0 in | Wt 178.0 lb

## 2014-10-17 DIAGNOSIS — C44729 Squamous cell carcinoma of skin of left lower limb, including hip: Secondary | ICD-10-CM | POA: Diagnosis not present

## 2014-10-17 DIAGNOSIS — R609 Edema, unspecified: Secondary | ICD-10-CM | POA: Diagnosis not present

## 2014-10-17 DIAGNOSIS — R0602 Shortness of breath: Secondary | ICD-10-CM | POA: Diagnosis not present

## 2014-10-17 DIAGNOSIS — IMO0002 Reserved for concepts with insufficient information to code with codable children: Secondary | ICD-10-CM

## 2014-10-17 DIAGNOSIS — C4492 Squamous cell carcinoma of skin, unspecified: Secondary | ICD-10-CM

## 2014-10-17 DIAGNOSIS — C801 Malignant (primary) neoplasm, unspecified: Secondary | ICD-10-CM | POA: Diagnosis not present

## 2014-10-17 DIAGNOSIS — R600 Localized edema: Secondary | ICD-10-CM

## 2014-10-17 DIAGNOSIS — S81802D Unspecified open wound, left lower leg, subsequent encounter: Secondary | ICD-10-CM

## 2014-10-17 NOTE — Patient Instructions (Signed)
Keep wound covered until tomorrow morning. Okay to get wet in the shower. Do not apply any alcohol or peroxide products. Pat dry. Apply petroleum jelly/Vaseline twice a day. Cover again with some light gauze and an ace wrap for some compression. We should hopefully have the biopsy back within week

## 2014-10-17 NOTE — Progress Notes (Addendum)
   Subjective:    Patient ID: Zachary Waters, male    DOB: Sep 06, 1940, 74 y.o.   MRN: [id number]  HPI Still has scab and sore on the left lower extremity that is not healing.  aprox 1 cm x 0.5 cm in size. Says it much better than it was.  Smaller, less red and no longer hurts. He has avoided removing the scab which he had been doing.  He denies any swelling.   Review of Systems     Objective:   Physical Exam  Constitutional: He is oriented to person, place, and time. He appears well-developed and well-nourished.  HENT:  Head: Normocephalic and atraumatic.  Neurological: He is alert and oriented to person, place, and time.  Skin: Skin is warm and dry.  Long shaped scab approx 1 cm x 0.5 cm that had some pink skiny skin around the border 0.5-1 cm from the edge. No evidence of cellulitis.  1+ pitting edema from mid tibia ot ankle.  Trace edema around the right ankle.   Psychiatric: He has a normal mood and affect. His behavior is normal.          Assessment & Plan:  Wound not healing x 5 months.  Recommend biopsy.  Could be squamous cell skin cancer. Will call with results once available.  Shave bx performed.    Edema of LE - this is clearly new. He said he never even noticed it before and said something to him today about it. We'll recheck his renal and liver function. We'll recheck his TSH. It was borderline about 3 months ago. We are going to recheck in September but we'll did a little sooner now. He has had some increased shortness of breath with his COPD but has had a full workup recently with his pulmonologist and there's been no decline in his lung function. He had primary function testing, sleep test, and even an echocardiogram. He says they were supposed to do an EKG but this never happened. We will do that today. We'll also check a BNP. I will try to get copy of the echo results from [place].   Shortness of breath-had workup as discussed above with pulmonologist. Neurontin that was  missing was the EKG so we went ahead and did that today. EKG shows normal sinus rhythm of 63 bpm, no acute changes with normal axis.  Shave Biopsy Procedure Note  Pre-operative Diagnosis: Suspicious lesion, wound not healing Post-operative Diagnosis: same  Locations:left ant shin  Indications: not healing x 5 months  Anesthesia: Lidocaine 1% with epinephrine without added sodium bicarbonate  Procedure Details  History of allergy to iodine: no  Patient informed of the risks (including bleeding and infection) and benefits of the  procedure and Verbal informed consent obtained.  The lesion and surrounding area were given a sterile prep using chlorhexidine and draped in the usual sterile fashion. A dermablade was used to shave an area of skin approximately 1.2cm by 1.2cm.  Hemostasis achieved with alumuninum chloride. Antibiotic ointment and a sterile dressing applied.  The specimen was sent for pathologic examination. The patient tolerated the procedure well.  EBL: 0.5 ml  Findings: Await pathology  Condition: Stable  Complications: none.  Plan: 1. Instructed to keep the wound dry and covered for 24-48h and clean thereafter. 2. Warning signs of infection were reviewed.   3. Recommended that the patient use OTC acetaminophen as needed for pain.  4. Return PRN.

## 2014-10-17 NOTE — Addendum Note (Signed)
Addended by: Teddy Spike on: 10/17/2014 01:17 PM   Modules accepted: Orders

## 2014-10-18 LAB — COMPLETE METABOLIC PANEL WITHOUT GFR
ALT: 17 U/L (ref 0–53)
AST: 21 U/L (ref 0–37)
Albumin: 4.4 g/dL (ref 3.5–5.2)
Alkaline Phosphatase: 57 U/L (ref 39–117)
BUN: 16 mg/dL (ref 6–23)
CO2: 23 meq/L (ref 19–32)
Calcium: 9.1 mg/dL (ref 8.4–10.5)
Chloride: 102 meq/L (ref 96–112)
Creat: 0.96 mg/dL (ref 0.50–1.35)
GFR, Est African American: >89 mL/min
GFR, Est Non African American: 79 mL/min
Glucose, Bld: 89 mg/dL (ref 70–99)
Potassium: 5 meq/L (ref 3.5–5.3)
Sodium: 138 meq/L (ref 135–145)
Total Bilirubin: 0.6 mg/dL (ref 0.2–1.2)
Total Protein: 6.6 g/dL (ref 6.0–8.3)

## 2014-10-18 LAB — URINALYSIS, MICROSCOPIC ONLY
Bacteria, UA: NONE SEEN
Casts: NONE SEEN
Crystals: NONE SEEN
Squamous Epithelial / HPF: NONE SEEN

## 2014-10-18 LAB — TSH: TSH: 4.054 u[IU]/mL (ref 0.350–4.500)

## 2014-10-18 LAB — BRAIN NATRIURETIC PEPTIDE: BRAIN NATRIURETIC PEPTIDE: 10.8 pg/mL (ref 0.0–100.0)

## 2014-10-21 NOTE — Addendum Note (Signed)
Addended by: Darla Lesches T on: 10/21/2014 02:34 PM   Modules accepted: Orders

## 2014-10-23 ENCOUNTER — Encounter: Payer: Self-pay | Admitting: Family Medicine

## 2014-10-29 ENCOUNTER — Encounter: Payer: Self-pay | Admitting: Family Medicine

## 2014-10-29 DIAGNOSIS — D485 Neoplasm of uncertain behavior of skin: Secondary | ICD-10-CM | POA: Diagnosis not present

## 2014-11-12 DIAGNOSIS — L570 Actinic keratosis: Secondary | ICD-10-CM | POA: Diagnosis not present

## 2014-12-10 DIAGNOSIS — C44729 Squamous cell carcinoma of skin of left lower limb, including hip: Secondary | ICD-10-CM | POA: Diagnosis not present

## 2014-12-22 LAB — LIPID PANEL
CHOLESTEROL: 195 mg/dL (ref 0–200)
HDL: 58 mg/dL (ref 35–70)
LDL Cholesterol: 119 mg/dL
Triglycerides: 90 mg/dL (ref 40–160)

## 2014-12-22 LAB — HEPATIC FUNCTION PANEL
ALT: 65 U/L — AB (ref 10–40)
AST: 17 U/L (ref 14–40)
Alkaline Phosphatase: 65 U/L (ref 25–125)
BILIRUBIN, TOTAL: 0.5 mg/dL
Bilirubin, Direct: 0.1 mg/dL (ref 0.01–0.4)

## 2014-12-22 LAB — BASIC METABOLIC PANEL
BUN: 17 mg/dL (ref 4–21)
Creatinine: 1 mg/dL (ref 0.6–1.3)
Glucose: 91 mg/dL
POTASSIUM: 4.6 mmol/L (ref 3.4–5.3)
SODIUM: 138 mmol/L (ref 137–147)

## 2014-12-22 LAB — CBC AND DIFFERENTIAL
Hemoglobin: 15.2 g/dL (ref 13.5–17.5)
Platelets: 285 10*3/uL (ref 150–399)
WBC: 5.8 10^3/mL

## 2014-12-22 LAB — VITAMIN D 25 HYDROXY (VIT D DEFICIENCY, FRACTURES): VIT D 25 HYDROXY: 35

## 2014-12-22 LAB — COMPREHENSIVE METABOLIC PANEL
ALBUMIN - PEBFLD: 4
Calcium, Ser: 8.7
Chloride: 103 mmol/L

## 2014-12-22 LAB — TSH: TSH: 3 u[IU]/mL (ref 0.41–5.90)

## 2015-01-06 ENCOUNTER — Encounter: Payer: Self-pay | Admitting: Family Medicine

## 2015-01-13 ENCOUNTER — Encounter: Payer: Self-pay | Admitting: Family Medicine

## 2015-01-13 ENCOUNTER — Ambulatory Visit (INDEPENDENT_AMBULATORY_CARE_PROVIDER_SITE_OTHER): Payer: Medicare Other | Admitting: Family Medicine

## 2015-01-13 VITALS — BP 171/70 | HR 59 | Temp 97.6°F | Ht 69.0 in | Wt 175.0 lb

## 2015-01-13 DIAGNOSIS — E039 Hypothyroidism, unspecified: Secondary | ICD-10-CM

## 2015-01-13 DIAGNOSIS — Z125 Encounter for screening for malignant neoplasm of prostate: Secondary | ICD-10-CM

## 2015-01-13 DIAGNOSIS — Z23 Encounter for immunization: Secondary | ICD-10-CM | POA: Diagnosis not present

## 2015-01-13 DIAGNOSIS — Z0000 Encounter for general adult medical examination without abnormal findings: Secondary | ICD-10-CM | POA: Diagnosis not present

## 2015-01-13 LAB — TSH: TSH: 3.367 u[IU]/mL (ref 0.350–4.500)

## 2015-01-13 NOTE — Progress Notes (Signed)
Subjective:    Zachary Waters is a 74 y.o. male who presents for Medicare Annual/Subsequent preventive examination.   Preventive Screening-Counseling & Management  Tobacco History  Smoking status  . Former Smoker  . Types: Pipe  . Quit date: 10/07/2009  Smokeless tobacco  . Not on file    Problems Prior to Visit 1. Wound is healing on his left lower leg.   Current Problems (verified) Patient Active Problem List   Diagnosis Date Noted  . Cervical disc disorder with radiculopathy of cervical region 05/28/2013  . Left shoulder, arm, and hand pain 01/06/2012  . Hypothyroid 10/20/2010  . COPD (chronic obstructive pulmonary disease) 10/20/2010    Medications Prior to Visit Current Outpatient Prescriptions on File Prior to Visit  Medication Sig Dispense Refill  . albuterol (PROAIR HFA) 108 (90 BASE) MCG/ACT inhaler Inhale 2 puffs into the lungs every 6 (six) hours as needed.      . Ascorbic Acid (VITAMIN C PO) Take by mouth.    Marland Kitchen aspirin 81 MG tablet Take 81 mg by mouth daily.      . budesonide-formoterol (SYMBICORT) 160-4.5 MCG/ACT inhaler Inhale 2 puffs into the lungs 2 (two) times daily.    . Fish Oil OIL by Does not apply route.    Marland Kitchen levothyroxine (SYNTHROID, LEVOTHROID) 75 MCG tablet Take 1 tablet every day 90 tablet 3  . QUERCETIN PO Take 2 tablets by mouth 2 (two) times daily.    Marland Kitchen tiotropium (SPIRIVA HANDIHALER) 18 MCG inhalation capsule Place 1 capsule (18 mcg total) into inhaler and inhale daily. 1 capsule 0   No current facility-administered medications on file prior to visit.    Current Medications (verified) Current Outpatient Prescriptions  Medication Sig Dispense Refill  . albuterol (PROAIR HFA) 108 (90 BASE) MCG/ACT inhaler Inhale 2 puffs into the lungs every 6 (six) hours as needed.      . Ascorbic Acid (VITAMIN C PO) Take by mouth.    Marland Kitchen aspirin 81 MG tablet Take 81 mg by mouth daily.      . budesonide-formoterol (SYMBICORT) 160-4.5 MCG/ACT inhaler Inhale  2 puffs into the lungs 2 (two) times daily.    . Fish Oil OIL by Does not apply route.    Marland Kitchen levothyroxine (SYNTHROID, LEVOTHROID) 75 MCG tablet Take 1 tablet every day 90 tablet 3  . QUERCETIN PO Take 2 tablets by mouth 2 (two) times daily.    Marland Kitchen tiotropium (SPIRIVA HANDIHALER) 18 MCG inhalation capsule Place 1 capsule (18 mcg total) into inhaler and inhale daily. 1 capsule 0   No current facility-administered medications for this visit.     Allergies (verified) Review of patient's allergies indicates no known allergies.   PAST HISTORY  Family History Family History  Problem Relation Age of Onset  . Prostate cancer Father 7  . Depression Brother     Commited suicide  . Heart disease Mother 42    deceased age 75    Social History Social History  Substance Use Topics  . Smoking status: Former Smoker    Types: Pipe    Quit date: 10/07/2009  . Smokeless tobacco: Not on file  . Alcohol Use: Yes    Are there smokers in your home (other than you)?  No  Risk Factors Current exercise habits: Gym/ health club routine includes cardio about 4 times a week and golf twice a week.  Dietary issues discussed: None, but would like to lose about 10 lbs.     Cardiac risk factors:  advanced age (older than 59 for men, 54 for women) and male gender.  Depression Screen (Note: if answer to either of the following is "Yes", a more complete depression screening is indicated)   Q1: Over the past two weeks, have you felt down, depressed or hopeless? No  Q2: Over the past two weeks, have you felt little interest or pleasure in doing things? No  Have you lost interest or pleasure in daily life? No  Do you often feel hopeless? No  Do you cry easily over simple problems? No  Activities of Daily Living In your present state of health, do you have any difficulty performing the following activities?:  Driving? No Managing money?  No Feeding yourself? No Getting from bed to chair? No Climbing a  flight of stairs? Yes Preparing food and eating?: No Bathing or showering? No Getting dressed: No Getting to the toilet? No Using the toilet:No Moving around from place to place: No In the past year have you fallen or had a near fall?:No   Hearing Difficulties: No Do you often ask people to speak up or repeat themselves? No Do you experience ringing or noises in your ears? No Do you have difficulty understanding soft or whispered voices? Yes   Do you feel that you have a problem with memory? No  Do you often misplace items? No  Do you feel safe at home?  Yes  Cognitive Testing  Alert? Yes  Normal Appearance?Yes  Oriented to person? Yes  Place? Yes   Time? Yes  Recall of three objects?  Yes  Can perform simple calculations? Yes  Displays appropriate judgment?Yes  Can read the correct time from a watch face?Yes    6 CIT is normal    Advanced Directives have been discussed with the patient? Yes   List the Names of Other Physician/Practitioners you currently use: 1.  Dermatology  Indicate any recent Medical Services you may have received from other than [facility] providers in the past year (date may be approximate).  Immunization History  Administered Date(s) Administered  . Influenza,inj,Quad PF,36+ Mos 02/12/2013, 01/09/2014, 01/13/2015  . Pneumococcal Conjugate-13 08/06/2013  . Pneumococcal Polysaccharide-23 05/09/2010  . Tdap 01/03/2012  . Zoster 10/27/2010    Screening Tests Health Maintenance  Topic Date Due  . INFLUENZA VACCINE  12/08/2015  . COLONOSCOPY  05/09/2016  . TETANUS/TDAP  01/02/2022  . ZOSTAVAX  Completed  . PNA vac Low Risk Adult  Completed    All answers were reviewed with the patient and necessary referrals were made:  METHENEY,CATHERINE, MD   01/13/2015   History reviewed: allergies, current medications, past family history, past medical history, past social history, past surgical history and problem list  Review of Systems A comprehensive  review of systems was negative.    Objective:     Vision by Snellen chart: eye exam UTD. Done in [facility]. He will let us know where done.    Blood pressure 171/70, pulse 59, temperature 97.6 F (36.4 C), height 5\' 9"  (1.753 m), weight 175 lb (79.379 kg). Body mass index is 25.83 kg/(m^2).  BP 171/70 mmHg  Pulse 59  Temp(Src) 97.6 F (36.4 C)  Ht 5\' 9"  (1.753 m)  Wt 175 lb (79.379 kg)  BMI 25.83 kg/m2 General appearance: alert, cooperative and appears stated age Head: Normocephalic, without obvious abnormality, atraumatic Eyes: conj clare, EOMI, PEERLA Ears: normal TM's and external ear canals both ears Nose: Nares normal. Septum midline. Mucosa normal. No drainage or sinus tenderness. Throat:  lips, mucosa, and tongue normal; teeth and gums normal Neck: no adenopathy, no carotid bruit, no JVD, supple, symmetrical, trachea midline and thyroid not enlarged, symmetric, no tenderness/mass/nodules Back: symmetric, no curvature. ROM normal. No CVA tenderness. Lungs: clear to auscultation bilaterally Chest wall: no tenderness Heart: regular rate and rhythm, S1, S2 normal, no murmur, click, rub or gallop Abdomen: soft, non-tender; bowel sounds normal; no masses,  no organomegaly Extremities: extremities normal, atraumatic, no cyanosis or edema Pulses: 2+ and symmetric Skin: he has an approx 2 cm dry crusted lesion with approx 2 cm of surrounding pink tiisse on the left lower leg.  Lymph nodes: Cervical, supraclavicular, and axillary nodes normal. Neurologic: Alert and oriented X 3, normal strength and tone. Normal symmetric reflexes. Normal coordination and gait     Assessment:     Medicare Wellness Exam       Plan:     During the course of the visit the patient was educated and counseled about appropriate screening and preventive services including:    Influenza vaccine   All other vaccines are UTD  Due for PSA  Hypothyroid - recheck TSH.   Continue with healthy  diet and exercise.   Diet review for nutrition referral? Yes ____  Not Indicated _X_   Patient Instructions (the written plan) was given to the patient.  Medicare Attestation I have personally reviewed: The patient's medical and social history Their use of alcohol, tobacco or illicit drugs Their current medications and supplements The patient's functional ability including ADLs,fall risks, home safety risks, cognitive, and hearing and visual impairment Diet and physical activities Evidence for depression or mood disorders  The patient's weight, height, BMI, and visual acuity have been recorded in the chart.  I have made referrals, counseling, and provided education to the patient based on review of the above and I have provided the patient with a written personalized care plan for preventive services.     METHENEY,CATHERINE, MD   01/13/2015

## 2015-01-13 NOTE — Patient Instructions (Signed)
Keep up a regular exercise program and make sure you are eating a healthy diet Try to eat 4 servings of dairy a day, or if you are lactose intolerant take a calcium with vitamin D daily.  Your vaccines are up to date.   

## 2015-01-14 ENCOUNTER — Encounter: Payer: Self-pay | Admitting: Family Medicine

## 2015-01-14 LAB — PSA: PSA: 1.14 ng/mL (ref <=4.00)

## 2015-01-15 ENCOUNTER — Encounter: Payer: Self-pay | Admitting: Family Medicine

## 2015-01-15 ENCOUNTER — Telehealth: Payer: Self-pay | Admitting: *Deleted

## 2015-01-15 DIAGNOSIS — E039 Hypothyroidism, unspecified: Secondary | ICD-10-CM

## 2015-01-15 NOTE — Telephone Encounter (Signed)
Ok to increase to extra half a tab a week. So 1.5 tab one day a week and just one tab all other day. Then recheck level in 8-12 weeks.

## 2015-01-15 NOTE — Telephone Encounter (Signed)
Pt informed labs ordered.Zachary Waters

## 2015-01-15 NOTE — Telephone Encounter (Signed)
Pt called back and would like for his levothyroxine to be changed. He stated that he just p/u his 90 day supply . Will fwd to pcp for f/u.[name]

## 2015-01-17 ENCOUNTER — Encounter: Payer: Self-pay | Admitting: Family Medicine

## 2015-02-05 ENCOUNTER — Encounter: Payer: Self-pay | Admitting: Family Medicine

## 2015-02-11 ENCOUNTER — Encounter: Payer: Self-pay | Admitting: Family Medicine

## 2015-02-11 DIAGNOSIS — D1801 Hemangioma of skin and subcutaneous tissue: Secondary | ICD-10-CM | POA: Diagnosis not present

## 2015-02-11 DIAGNOSIS — Z08 Encounter for follow-up examination after completed treatment for malignant neoplasm: Secondary | ICD-10-CM | POA: Diagnosis not present

## 2015-02-11 DIAGNOSIS — Z85828 Personal history of other malignant neoplasm of skin: Secondary | ICD-10-CM | POA: Diagnosis not present

## 2015-03-31 DIAGNOSIS — J449 Chronic obstructive pulmonary disease, unspecified: Secondary | ICD-10-CM | POA: Diagnosis not present

## 2015-03-31 DIAGNOSIS — R0602 Shortness of breath: Secondary | ICD-10-CM | POA: Diagnosis not present

## 2015-04-09 DIAGNOSIS — H2513 Age-related nuclear cataract, bilateral: Secondary | ICD-10-CM | POA: Diagnosis not present

## 2015-04-09 DIAGNOSIS — H524 Presbyopia: Secondary | ICD-10-CM | POA: Diagnosis not present

## 2015-07-06 ENCOUNTER — Other Ambulatory Visit: Payer: Self-pay | Admitting: *Deleted

## 2015-07-06 ENCOUNTER — Other Ambulatory Visit: Payer: Self-pay | Admitting: Family Medicine

## 2015-07-06 DIAGNOSIS — E039 Hypothyroidism, unspecified: Secondary | ICD-10-CM

## 2015-07-06 NOTE — Telephone Encounter (Signed)
Due for labs

## 2015-07-22 DIAGNOSIS — Z85828 Personal history of other malignant neoplasm of skin: Secondary | ICD-10-CM | POA: Diagnosis not present

## 2015-07-22 DIAGNOSIS — Z08 Encounter for follow-up examination after completed treatment for malignant neoplasm: Secondary | ICD-10-CM | POA: Diagnosis not present

## 2015-07-30 ENCOUNTER — Ambulatory Visit (INDEPENDENT_AMBULATORY_CARE_PROVIDER_SITE_OTHER): Payer: Medicare Other

## 2015-07-30 ENCOUNTER — Ambulatory Visit (INDEPENDENT_AMBULATORY_CARE_PROVIDER_SITE_OTHER): Payer: Medicare Other | Admitting: Osteopathic Medicine

## 2015-07-30 ENCOUNTER — Encounter: Payer: Self-pay | Admitting: Family Medicine

## 2015-07-30 ENCOUNTER — Telehealth: Payer: Self-pay

## 2015-07-30 ENCOUNTER — Encounter: Payer: Self-pay | Admitting: Osteopathic Medicine

## 2015-07-30 VITALS — BP 160/80 | HR 76 | Wt 179.0 lb

## 2015-07-30 DIAGNOSIS — J441 Chronic obstructive pulmonary disease with (acute) exacerbation: Secondary | ICD-10-CM | POA: Diagnosis not present

## 2015-07-30 DIAGNOSIS — R0602 Shortness of breath: Secondary | ICD-10-CM

## 2015-07-30 DIAGNOSIS — R059 Cough, unspecified: Secondary | ICD-10-CM

## 2015-07-30 DIAGNOSIS — J42 Unspecified chronic bronchitis: Secondary | ICD-10-CM

## 2015-07-30 DIAGNOSIS — I10 Essential (primary) hypertension: Secondary | ICD-10-CM | POA: Insufficient documentation

## 2015-07-30 DIAGNOSIS — IMO0001 Reserved for inherently not codable concepts without codable children: Secondary | ICD-10-CM

## 2015-07-30 DIAGNOSIS — R05 Cough: Secondary | ICD-10-CM | POA: Diagnosis not present

## 2015-07-30 DIAGNOSIS — R030 Elevated blood-pressure reading, without diagnosis of hypertension: Secondary | ICD-10-CM

## 2015-07-30 DIAGNOSIS — E039 Hypothyroidism, unspecified: Secondary | ICD-10-CM

## 2015-07-30 MED ORDER — GUAIFENESIN-CODEINE 100-10 MG/5ML PO SOLN: 5.0000 mL | Freq: Four times a day (QID) | ORAL | Status: DC | PRN

## 2015-07-30 MED ORDER — PREDNISONE 20 MG PO TABS: 20.0000 mg | ORAL_TABLET | Freq: Two times a day (BID) | ORAL | Status: DC

## 2015-07-30 MED ORDER — AZITHROMYCIN 250 MG PO TABS: ORAL_TABLET | ORAL | Status: DC

## 2015-07-30 MED ORDER — HYDROCODONE-GUAIFENESIN 2.5-200 MG/5ML PO SOLN: 5.0000 mL | Freq: Three times a day (TID) | ORAL | Status: DC | PRN

## 2015-07-30 NOTE — Patient Instructions (Addendum)
Use Albuterol and Spiriva a few times per day while you're sick.  Let us know if you're feeling worse!    Chronic Obstructive Pulmonary Disease Chronic obstructive pulmonary disease (COPD) is a common lung condition in which airflow from the lungs is limited. COPD is a general term that can be used to describe many different lung problems that limit airflow, including both chronic bronchitis and emphysema. If you have COPD, your lung function will probably never return to normal, but there are measures you can take to improve lung function and make yourself feel better. CAUSES   Smoking (common).  Exposure to secondhand smoke.  Genetic problems.  Chronic inflammatory lung diseases or recurrent infections. SYMPTOMS  Shortness of breath, especially with physical activity.  Deep, persistent (chronic) cough with a large amount of thick mucus.  Wheezing.  Rapid breaths (tachypnea).  Gray or bluish discoloration (cyanosis) of the skin, especially in your fingers, toes, or lips.  Fatigue.  Weight loss.  Frequent infections or episodes when breathing symptoms become much worse (exacerbations).  Chest tightness. DIAGNOSIS Your health care provider will take a medical history and perform a physical examination to diagnose COPD. Additional tests for COPD may include:  Lung (pulmonary) function tests.  Chest X-ray.  CT scan.  Blood tests. TREATMENT  Treatment for COPD may include:  Inhaler and nebulizer medicines. These help manage the symptoms of COPD and make your breathing more comfortable.  Supplemental oxygen. Supplemental oxygen is only helpful if you have a low oxygen level in your blood.  Exercise and physical activity. These are beneficial for nearly all people with COPD.  Lung surgery or transplant.  Nutrition therapy to gain weight, if you are underweight.  Pulmonary rehabilitation. This may involve working with a team of health care providers and specialists,  such as respiratory, occupational, and physical therapists. HOME CARE INSTRUCTIONS  Take all medicines (inhaled or pills) as directed by your health care provider.  Avoid over-the-counter medicines or cough syrups that dry up your airway (such as antihistamines) and slow down the elimination of secretions unless instructed otherwise by your health care provider.  If you are a smoker, the most important thing that you can do is stop smoking. Continuing to smoke will cause further lung damage and breathing trouble. Ask your health care provider for help with quitting smoking. He or she can direct you to community resources or hospitals that provide support.  Avoid exposure to irritants such as smoke, chemicals, and fumes that aggravate your breathing.  Use oxygen therapy and pulmonary rehabilitation if directed by your health care provider. If you require home oxygen therapy, ask your health care provider whether you should purchase a pulse oximeter to measure your oxygen level at home.  Avoid contact with individuals who have a contagious illness.  Avoid extreme temperature and humidity changes.  Eat healthy foods. Eating smaller, more frequent meals and resting before meals may help you maintain your strength.  Stay active, but balance activity with periods of rest. Exercise and physical activity will help you maintain your ability to do things you want to do.  Preventing infection and hospitalization is very important when you have COPD. Make sure to receive all the vaccines your health care provider recommends, especially the pneumococcal and influenza vaccines. Ask your health care provider whether you need a pneumonia vaccine.  Learn and use relaxation techniques to manage stress.  Learn and use controlled breathing techniques as directed by your health care provider. Controlled breathing techniques include:  Pursed lip breathing. Start by breathing in (inhaling) through your nose for  1 second. Then, purse your lips as if you were going to whistle and breathe out (exhale) through the pursed lips for 2 seconds.  Diaphragmatic breathing. Start by putting one hand on your abdomen just above your waist. Inhale slowly through your nose. The hand on your abdomen should move out. Then purse your lips and exhale slowly. You should be able to feel the hand on your abdomen moving in as you exhale.  Learn and use controlled coughing to clear mucus from your lungs. Controlled coughing is a series of short, progressive coughs. The steps of controlled coughing are: 1. Lean your head slightly forward. 2. Breathe in deeply using diaphragmatic breathing. 3. Try to hold your breath for 3 seconds. 4. Keep your mouth slightly open while coughing twice. 5. Spit any mucus out into a tissue. 6. Rest and repeat the steps once or twice as needed. SEEK MEDICAL CARE IF:  You are coughing up more mucus than usual.  There is a change in the color or thickness of your mucus.  Your breathing is more labored than usual.  Your breathing is faster than usual. SEEK IMMEDIATE MEDICAL CARE IF:  You have shortness of breath while you are resting.  You have shortness of breath that prevents you from:  Being able to talk.  Performing your usual physical activities.  You have chest pain lasting longer than 5 minutes.  Your skin color is more cyanotic than usual.  You measure low oxygen saturations for longer than 5 minutes with a pulse oximeter. MAKE SURE YOU:  Understand these instructions.  Will watch your condition.  Will get help right away if you are not doing well or get worse.   This information is not intended to replace advice given to you by your health care provider. Make sure you discuss any questions you have with your health care provider.   Document Released: 02/02/2005 Document Revised: 05/16/2014 Document Reviewed: 12/20/2012 Elsevier Interactive Patient Education [employer]

## 2015-07-30 NOTE — Telephone Encounter (Signed)
The pharmacy called and all the cough syrups are on back order. The only one left is the Flowtuss. Please advise.

## 2015-07-30 NOTE — Telephone Encounter (Signed)
Taking care of, see addendum to progress note

## 2015-07-30 NOTE — Addendum Note (Signed)
Addended by: Maryla Morrow on: 07/30/2015 10:56 AM   Modules accepted: Orders, Medications

## 2015-07-30 NOTE — Progress Notes (Signed)
HPI: Zachary Waters is a 75 y.o. male who presents to [facility]  today for chief complaint of:  Chief Complaint  Patient presents with  . Cough    fatigue   COUGH . Quality: coughing . Assoc signs/symptoms: see ROS. Complains about fatigue as worst. Has noticed more productive cough.  . Duration: coughing worse few days, really bad last night . Modifying factors: has tried the following OTC/Rx medications: Aleve, DayQuil, NyQuil. Also on Symbicort bid, Spiriva daily, Albuterol prn . Context: known COPD  HYPOTHYROID - overdue for recheck TSH, orders are in per Dr. Madilyn Fireman.  HYPERTENSION - was high last visit, elevated today, no CP, known COPD SOB but no angina, no ha/vc. Pt states it's often high at doctor's offices, has been normal at the gym when he checks it there.   Vitals with BMI 07/30/2015 01/13/2015 10/17/2014 09/25/2014 09/15/2014  Height  5\' 9"  5\' 9"  5\' 9"    Weight 179 lbs 175 lbs 178 lbs 174 lbs   BMI  25.9 [id number]   Systolic 0000000 [id number]  Diastolic 80 70 83 76 88  Pulse 76 59 64 68   Respirations        Vitals with BMI 09/15/2014 09/15/2014  Height    Weight  178 lbs  BMI    Systolic 0000000 0000000  Diastolic 92 90  Pulse 64 63  Respirations      Past medical, social and family history reviewed. Current medications and allergies reviewed.     Review of Systems: CONSTITUTIONAL: no fever/chills HEAD/EYES/EARS/NOSE/THROAT: no headache, no vision change or hearing change, no sore throat CARDIAC: No chest pain/pressure/palpitations RESPIRATORY: yes cough, yes shortness of breath GASTROINTESTINAL: no nausea, no vomiting, no abdominal pain, no diarrhea MUSCULOSKELETAL: yes myalgia/arthralgia   Exam:  BP 160/80 mmHg  Pulse 76  Wt 179 lb (81.194 kg)  SpO2 96% Constitutional: VSS, see above. General Appearance: alert, well-developed, well-nourished, NAD Eyes: Normal lids and conjunctive, non-icteric sclera,  Ears, Nose, Mouth,  Throat: Normal external inspection ears/nares/mouth/lips/gums, normal TM, MMM;       posterior pharynx with erythema, without exudate Neck: No masses, trachea midline. normal lymph nodes Respiratory: Normal respiratory effort. Yes  Wheeze/rhonchi worse on R, diminished breath sounds bilaterally . Cardiovascular: S1/S2 normal, no murmur/rub/gallop auscultated. RRR.  CXR on personal review Cardiomediastinal silhouette/heart size: abnormal - slight narrow Obvious bony abnormality: none Infiltrate: possible infiltrate diffuse vs scarring, no consolidation/lobar infiltrate Mass or other opacity: none Atelectasis: possible Diaphragms: abnormal - flattened Lateral view: normal Images were reviewed with the patient. Pt counseled that radiologist will review the images as well, our office will call if the formal read reveals any significant findings other than what has been noted above.   No results found for this or any previous visit (from the past 72 hour(s)). No results found.   ASSESSMENT/PLAN:  COPD exacerbation (HCC) - Plan: predniSONE (DELTASONE) 20 MG tablet, azithromycin (ZITHROMAX) 250 MG tablet  Chronic bronchitis, unspecified chronic bronchitis type ([place]) - Plan: DG Chest 2 View  SOB (shortness of breath) - Plan: DG Chest 2 View  Cough - Plan: DG Chest 2 View, guaiFENesin-codeine 100-10 MG/5ML syrup  Elevated blood pressure  Hypothyroidism, unspecified hypothyroidism type   Return in about 1 week (around 08/06/2015), or if symptoms worsen or fail to improve, for FOLLOW UP WITH DR Ashford - HYPERTENSION, RECHECK BREATHING.

## 2015-08-06 ENCOUNTER — Encounter: Payer: Self-pay | Admitting: Family Medicine

## 2015-08-06 ENCOUNTER — Ambulatory Visit (INDEPENDENT_AMBULATORY_CARE_PROVIDER_SITE_OTHER): Payer: Medicare Other | Admitting: Family Medicine

## 2015-08-06 VITALS — BP 120/66 | HR 63 | Wt 176.0 lb

## 2015-08-06 DIAGNOSIS — J309 Allergic rhinitis, unspecified: Secondary | ICD-10-CM | POA: Diagnosis not present

## 2015-08-06 DIAGNOSIS — R058 Other specified cough: Secondary | ICD-10-CM

## 2015-08-06 DIAGNOSIS — R05 Cough: Secondary | ICD-10-CM | POA: Diagnosis not present

## 2015-08-06 MED ORDER — MONTELUKAST SODIUM 10 MG PO TABS: 10.0000 mg | ORAL_TABLET | Freq: Every day | ORAL | Status: DC

## 2015-08-06 NOTE — Progress Notes (Signed)
   Subjective:    Patient ID: Zachary Waters, male    DOB: 12/14/1940, 75 y.o.   MRN: [id number]  HPI F/U COPD exacerbation/bronchitis - He was seen 7 days ago and he completed the zpack and prednisone. Still has a cough with some occ green Nasal mucous. He did have a chest x-ray which was negative for pneumonia. He overall reports that he feels about 75% better and is just frustrated with the persistent symptoms that are remaining. He says he bothers him a little bit more when he is out on the golf course. He has taken an oral antihistamine in the past occasionally. But is not currently taking 1.   Review of Systems     Objective:   Physical Exam  Constitutional: He is oriented to person, place, and time. He appears well-developed and well-nourished.  HENT:  Head: Normocephalic and atraumatic.  Right Ear: External ear normal.  Left Ear: External ear normal.  Nose: Nose normal.  Mouth/Throat: Oropharynx is clear and moist.  TMs and canals are clear.   Eyes: Conjunctivae and EOM are normal. Pupils are equal, round, and reactive to light.  Neck: Neck supple. No thyromegaly present.  Cardiovascular: Normal rate and normal heart sounds.   Pulmonary/Chest: Effort normal and breath sounds normal.  Lymphadenopathy:    He has no cervical adenopathy.  Neurological: He is alert and oriented to person, place, and time.  Skin: Skin is warm and dry.  Psychiatric: He has a normal mood and affect.          Assessment & Plan:  Post infectious cough - I think at this point he is experiencing a postinfectious cough. He feels like he is at least 75% better after and a box of prednisone but still having a lot of nasal congestion and intermittent cough. If he feels like he is getting worse in call us back sooner. If he is not significantly better after week and call us back as well. He did have a chest x-ray which was negative for pneumonia which is also very reassuring. Recommend a trial of an  over-the-counter antihistamine. We could also consider Singulair as an option. Though he doesn't have a history of asthma some people do have more asthmatic response to either allergies.

## 2015-08-12 ENCOUNTER — Encounter: Payer: Self-pay | Admitting: Family Medicine

## 2015-09-07 ENCOUNTER — Other Ambulatory Visit: Payer: Self-pay | Admitting: *Deleted

## 2015-09-07 ENCOUNTER — Telehealth: Payer: Self-pay | Admitting: Family Medicine

## 2015-09-07 ENCOUNTER — Encounter: Payer: Self-pay | Admitting: Family Medicine

## 2015-09-07 DIAGNOSIS — E039 Hypothyroidism, unspecified: Secondary | ICD-10-CM | POA: Diagnosis not present

## 2015-09-07 NOTE — Telephone Encounter (Signed)
Patient called request to get lab order sent down tomorrow so he can get labs done before Friday has an appt scheduled for 09/11/15

## 2015-09-07 NOTE — Telephone Encounter (Signed)
Labs printed. Ok to fax.

## 2015-09-08 LAB — BASIC METABOLIC PANEL WITH GFR
BUN: 17 mg/dL (ref 7–25)
CHLORIDE: 102 mmol/L (ref 98–110)
CO2: 25 mmol/L (ref 20–31)
Calcium: 9.1 mg/dL (ref 8.6–10.3)
Creat: 0.99 mg/dL (ref 0.70–1.18)
GFR, EST NON AFRICAN AMERICAN: 76 mL/min (ref >=60)
GFR, Est African American: 88 mL/min (ref >=60)
Glucose, Bld: 76 mg/dL (ref 65–99)
POTASSIUM: 4.9 mmol/L (ref 3.5–5.3)
SODIUM: 137 mmol/L (ref 135–146)

## 2015-09-08 LAB — TSH: TSH: 4.04 m[IU]/L (ref 0.40–4.50)

## 2015-09-09 MED ORDER — LEVOTHYROXINE SODIUM 88 MCG PO TABS: 88.0000 ug | ORAL_TABLET | Freq: Every day | ORAL | Status: DC

## 2015-09-09 NOTE — Telephone Encounter (Signed)
New rx sent for inc thyroid med dose.

## 2015-09-11 ENCOUNTER — Ambulatory Visit (INDEPENDENT_AMBULATORY_CARE_PROVIDER_SITE_OTHER): Payer: Medicare Other | Admitting: Family Medicine

## 2015-09-11 ENCOUNTER — Encounter: Payer: Self-pay | Admitting: Family Medicine

## 2015-09-11 VITALS — BP 138/78 | HR 66 | Wt 178.0 lb

## 2015-09-11 DIAGNOSIS — R030 Elevated blood-pressure reading, without diagnosis of hypertension: Secondary | ICD-10-CM | POA: Diagnosis not present

## 2015-09-11 DIAGNOSIS — J439 Emphysema, unspecified: Secondary | ICD-10-CM | POA: Diagnosis not present

## 2015-09-11 DIAGNOSIS — IMO0001 Reserved for inherently not codable concepts without codable children: Secondary | ICD-10-CM

## 2015-09-11 DIAGNOSIS — E039 Hypothyroidism, unspecified: Secondary | ICD-10-CM | POA: Diagnosis not present

## 2015-09-11 DIAGNOSIS — K9049 Malabsorption due to intolerance, not elsewhere classified: Secondary | ICD-10-CM | POA: Insufficient documentation

## 2015-09-11 DIAGNOSIS — J432 Centrilobular emphysema: Secondary | ICD-10-CM | POA: Insufficient documentation

## 2015-09-11 NOTE — Patient Instructions (Signed)
Go to the lab in about 6-8 weeks after starting the new thyroid medication dose. You do not have to faster do anything special before you go for lab work. If everything looks great then just let us know if it's okay to send a 90 day supply at that time.

## 2015-09-11 NOTE — Progress Notes (Signed)
Subjective:    CC: COPD follow-up  HPI:  Follow-up COPD-he is actually doing really well with the addition of Singulair. He says he noticed within a day or 2 improved symptoms. He was less short of breath going upstairs especially at the gym. He's also been taking Claritin for the spring allergy season and wants to know how long he should continue it. Next  Milk intolerance-he gets a lot of phlegm in the back of his throat and the neck causes coughing. He time he tried several different over-the-counter products and finally found one called Lactaid that works really well for him. Next  Elevated BP Pt denies chest pain, SOB, dizziness, or heart palpitations.  e GE him and brought in some blood pressure readings in the last couple of days. 124/86 in the 118/84 with a pulse running in the 60s.  Hypothyroidism-recently checked his thyroid labs. TSH was around 4. It has been there previously but this time he has also complained of some weight gain recently even though he works out very regularly and he has been unable to lose weight. We have discussed increasing his thyroid medication to 88 g and he had some questions about this.  Past medical history, Surgical history, Family history not pertinant except as noted below, Social history, Allergies, and medications have been entered into the medical record, reviewed, and no changes needed.   Review of Systems: No fevers, chills, night sweats, weight loss, chest pain, or shortness of breath.   Objective:    General: Well Developed, well nourished, and in no acute distress.  Neuro: Alert and oriented x3, extra-ocular muscles intact, sensation grossly intact.  HEENT: Normocephalic, atraumatic,.  Skin: Warm and dry, no rashes. Cardiac: Regular rate and rhythm, no murmurs rubs or gallops, no lower extremity edema.  Respiratory: Clear to auscultation bilaterally. Not using accessory muscles, speaking in full sentences.   Impression and  Recommendations:    COPD - Stable. Doing a little bit better with Singulair. He wants to continue it for now. Follow-up in 3 months.  Hypothyroidism-explained and reviewed his lab work with him. We are currently trying any micrograms daily and we'll recheck his level in 6-8 weeks. Will see if this also helps with recent weight gain that he has experienced.  Milk and tolerance-doing well with switching to Lactaid.  Elevated BP-well-controlled. Continue current regimen. Follow-up in 6 months.

## 2015-09-29 DIAGNOSIS — R0602 Shortness of breath: Secondary | ICD-10-CM | POA: Diagnosis not present

## 2015-09-29 DIAGNOSIS — J449 Chronic obstructive pulmonary disease, unspecified: Secondary | ICD-10-CM | POA: Diagnosis not present

## 2015-12-06 ENCOUNTER — Encounter: Payer: Self-pay | Admitting: Family Medicine

## 2015-12-08 ENCOUNTER — Encounter: Payer: Self-pay | Admitting: Family Medicine

## 2015-12-10 DIAGNOSIS — E039 Hypothyroidism, unspecified: Secondary | ICD-10-CM | POA: Diagnosis not present

## 2015-12-10 LAB — TSH: TSH: 4.14 m[IU]/L (ref 0.40–4.50)

## 2015-12-13 ENCOUNTER — Encounter: Payer: Self-pay | Admitting: Family Medicine

## 2015-12-15 ENCOUNTER — Encounter: Payer: Self-pay | Admitting: Family Medicine

## 2015-12-16 MED ORDER — LEVOTHYROXINE SODIUM 88 MCG PO TABS: ORAL_TABLET | ORAL | 0 refills | Status: DC

## 2015-12-28 LAB — CBC AND DIFFERENTIAL
HEMATOCRIT: 46 % (ref 41–53)
Hemoglobin: 15.4 g/dL (ref 13.5–17.5)
PLATELETS: 202 10*3/uL (ref 150–399)
WBC: 5 10^3/mL

## 2015-12-28 LAB — HEPATIC FUNCTION PANEL
ALK PHOS: 66 U/L (ref 25–125)
ALT: 25 U/L (ref 10–40)
AST: 19 U/L (ref 14–40)
Bilirubin, Total: 0.4 mg/dL

## 2015-12-28 LAB — LIPID PANEL
Cholesterol: 185 mg/dL (ref 0–200)
HDL: 64 mg/dL (ref 35–70)
LDL Cholesterol: 101 mg/dL
Triglycerides: 98 mg/dL (ref 40–160)

## 2015-12-28 LAB — BASIC METABOLIC PANEL
Creatinine: 1.1 mg/dL (ref 0.6–1.3)
POTASSIUM: 4.7 mmol/L (ref 3.4–5.3)
Sodium: 141 mmol/L (ref 137–147)

## 2015-12-28 LAB — HEMOGLOBIN A1C: Hemoglobin A1C: 5.8

## 2016-01-06 ENCOUNTER — Encounter: Payer: Self-pay | Admitting: Family Medicine

## 2016-01-08 ENCOUNTER — Encounter: Payer: Self-pay | Admitting: Family Medicine

## 2016-01-08 DIAGNOSIS — R7301 Impaired fasting glucose: Secondary | ICD-10-CM | POA: Insufficient documentation

## 2016-01-12 ENCOUNTER — Encounter: Payer: Self-pay | Admitting: Family Medicine

## 2016-01-14 ENCOUNTER — Encounter: Payer: Self-pay | Admitting: Family Medicine

## 2016-01-15 ENCOUNTER — Encounter: Payer: Self-pay | Admitting: Family Medicine

## 2016-01-27 ENCOUNTER — Encounter: Payer: Self-pay | Admitting: Family Medicine

## 2016-01-27 DIAGNOSIS — Z85828 Personal history of other malignant neoplasm of skin: Secondary | ICD-10-CM | POA: Diagnosis not present

## 2016-01-27 DIAGNOSIS — Z08 Encounter for follow-up examination after completed treatment for malignant neoplasm: Secondary | ICD-10-CM | POA: Diagnosis not present

## 2016-01-27 DIAGNOSIS — L570 Actinic keratosis: Secondary | ICD-10-CM | POA: Diagnosis not present

## 2016-01-27 NOTE — Telephone Encounter (Signed)
Left VM for Pt to see what blood work he wanted done. Upon review of chart, none are required at this time.

## 2016-02-03 ENCOUNTER — Encounter: Payer: Self-pay | Admitting: Family Medicine

## 2016-02-03 ENCOUNTER — Ambulatory Visit (INDEPENDENT_AMBULATORY_CARE_PROVIDER_SITE_OTHER): Payer: Medicare Other | Admitting: Family Medicine

## 2016-02-03 VITALS — BP 155/73 | HR 57 | Ht 69.0 in | Wt 168.0 lb

## 2016-02-03 DIAGNOSIS — E039 Hypothyroidism, unspecified: Secondary | ICD-10-CM

## 2016-02-03 DIAGNOSIS — R7301 Impaired fasting glucose: Secondary | ICD-10-CM | POA: Diagnosis not present

## 2016-02-03 DIAGNOSIS — Z0000 Encounter for general adult medical examination without abnormal findings: Secondary | ICD-10-CM | POA: Diagnosis not present

## 2016-02-03 NOTE — Progress Notes (Signed)
Subjective:   Zachary Waters is a 75 y.o. male who presents for Medicare Annual/Subsequent preventive examination.  Review of Systems:  Comprehensive review systems is negative. He has been complaining of some occasional double vision. He notices it when he looks up at the [facility] sometimes when coughing. He says he has called to get an earlier eye appointment. He does once a year and it's normally scheduled in December but he has called and has an appointment scheduled in a few weeks.       Objective:    Vitals: BP (!) 155/73   Pulse (!) 57   Ht 5\' 9"  (1.753 m)   Wt 168 lb (76.2 kg)   SpO2 97%   BMI 24.81 kg/m   Body mass index is 24.81 kg/m.   Physical Exam  Constitutional: He is oriented to person, place, and time. He appears well-developed and well-nourished.  HENT:  Head: Normocephalic and atraumatic.  Right Ear: External ear normal.  Left Ear: External ear normal.  Nose: Nose normal.  Mouth/Throat: Oropharynx is clear and moist.  Eyes: Conjunctivae and EOM are normal. Pupils are equal, round, and reactive to light.  Neck: Normal range of motion. Neck supple. No thyromegaly present.  Cardiovascular: Normal rate, regular rhythm, normal heart sounds and intact distal pulses.   Pulmonary/Chest: Effort normal and breath sounds normal.  Abdominal: Soft. Bowel sounds are normal. He exhibits no distension and no mass. There is no tenderness. There is no rebound and no guarding.  Musculoskeletal: Normal range of motion.  Lymphadenopathy:    He has no cervical adenopathy.  Neurological: He is alert and oriented to person, place, and time.  2+ patellar reflex on the left and 1+ on the right  Skin: Skin is warm and dry.  Psychiatric: He has a normal mood and affect. His behavior is normal. Judgment and thought content normal.     Tobacco History  Smoking Status  . Former Smoker  . Types: Pipe  . Quit date: 10/07/2009  Smokeless Tobacco  . Not on file      Counseling given: Not Answered   Past Medical History:  Diagnosis Date  . COPD (chronic obstructive pulmonary disease) ([place])   . GERD (gastroesophageal reflux disease) once in a great while  . Pneumonia 10/2009  . Thyroid disease    Past Surgical History:  Procedure Laterality Date  . LEG SURGERY Right   . ROTATOR CUFF REPAIR  2005   Left  . TONSILLECTOMY     Family History  Problem Relation Age of Onset  . Prostate cancer Father 75  . Heart disease Mother 41    deceased age 3  . Depression Brother     Commited suicide  . Depression Brother    History  Sexual Activity  . Sexual activity: No    Outpatient Encounter Prescriptions as of 02/03/2016  Medication Sig  . albuterol (PROAIR HFA) 108 (90 BASE) MCG/ACT inhaler Inhale 2 puffs into the lungs every 6 (six) hours as needed.    . Ascorbic Acid (VITAMIN C PO) Take by mouth.  . budesonide-formoterol (SYMBICORT) 160-4.5 MCG/ACT inhaler Inhale 2 puffs into the lungs 2 (two) times daily.  . Fish Oil OIL by Does not apply route.  Marland Kitchen levothyroxine (SYNTHROID, LEVOTHROID) 88 MCG tablet Take one tablet daily except take two tablets on Friday.  . montelukast (SINGULAIR) 10 MG tablet Take 1 tablet (10 mg total) by mouth at bedtime.  Marland Kitchen QUERCETIN PO Take 2 tablets  by mouth 2 (two) times daily.  Marland Kitchen tiotropium (SPIRIVA HANDIHALER) 18 MCG inhalation capsule Place 1 capsule (18 mcg total) into inhaler and inhale daily.   No facility-administered encounter medications on file as of 02/03/2016.     Activities of Daily Living In your present state of health, do you have any difficulty performing the following activities: 02/03/2016  Hearing? Y  Vision? Y  Difficulty concentrating or making decisions? N  Walking or climbing stairs? Y  Dressing or bathing? N  Doing errands, shopping? N  Some recent data might be hidden    Patient Care Team: Hali Marry, MD as PCP - General (Family Medicine) Marcy Panning (Pulmonary  Disease) Milas Kocher (Chiropractic Medicine)   Assessment:    Medicare Wellness Exam  Exercise Activities and Dietary recommendations Current Exercise Habits: Structured exercise class, Type of exercise: treadmill, Frequency (Times/Week): 5  Goals    None     Fall Risk Fall Risk  02/03/2016 01/13/2015 09/25/2014 08/06/2013  Falls in the past year? No No No No   Depression Screen PHQ 2/9 Scores 02/03/2016 01/13/2015 08/06/2013  PHQ - 2 Score 0 0 0    Cognitive Testing No flowsheet data found.  6 CIT score of 2.   Immunization History  Administered Date(s) Administered  . Influenza,inj,Quad PF,36+ Mos 02/12/2013, 01/09/2014, 01/13/2015  . Pneumococcal Conjugate-13 08/06/2013  . Pneumococcal Polysaccharide-23 05/09/2010  . Tdap 01/03/2012  . Zoster 10/27/2010   Screening Tests Health Maintenance  Topic Date Due  . INFLUENZA VACCINE  12/08/2015  . COLONOSCOPY  05/09/2016  . TETANUS/TDAP  01/02/2022  . ZOSTAVAX  Completed  . PNA vac Low Risk Adult  Completed      Plan:    During the course of the visit the patient was educated and counseled about the following appropriate screening and preventive services:   Vaccines to include Pneumoccal, Influenza, Hepatitis B, Td, Zostavax, HCV  Cardiovascular Disease  Colorectal cancer screening  Diabetes screening  Prostate Cancer Screening  Glaucoma screening  Nutrition counseling   Smoking cessation counseling  Double vision-recommend he definitely see his eye doctor for further evaluation.  Patient Instructions (the written plan) was given to the patient.    Divon Krabill, MD  02/03/2016

## 2016-02-03 NOTE — Patient Instructions (Signed)
Go to lab around the last week of November to recheck the A1C ( sugar average) and the TSH ( thyroid )

## 2016-02-08 DIAGNOSIS — H532 Diplopia: Secondary | ICD-10-CM | POA: Diagnosis not present

## 2016-02-09 DIAGNOSIS — H532 Diplopia: Secondary | ICD-10-CM | POA: Diagnosis not present

## 2016-02-09 DIAGNOSIS — Z7982 Long term (current) use of aspirin: Secondary | ICD-10-CM | POA: Diagnosis not present

## 2016-02-09 DIAGNOSIS — Z87891 Personal history of nicotine dependence: Secondary | ICD-10-CM | POA: Diagnosis not present

## 2016-02-09 DIAGNOSIS — H25813 Combined forms of age-related cataract, bilateral: Secondary | ICD-10-CM | POA: Diagnosis not present

## 2016-02-09 DIAGNOSIS — H2513 Age-related nuclear cataract, bilateral: Secondary | ICD-10-CM | POA: Diagnosis not present

## 2016-02-09 DIAGNOSIS — E039 Hypothyroidism, unspecified: Secondary | ICD-10-CM | POA: Diagnosis not present

## 2016-02-09 DIAGNOSIS — H02831 Dermatochalasis of right upper eyelid: Secondary | ICD-10-CM | POA: Diagnosis not present

## 2016-02-09 DIAGNOSIS — H02834 Dermatochalasis of left upper eyelid: Secondary | ICD-10-CM | POA: Diagnosis not present

## 2016-02-09 DIAGNOSIS — J449 Chronic obstructive pulmonary disease, unspecified: Secondary | ICD-10-CM | POA: Diagnosis not present

## 2016-02-09 DIAGNOSIS — H04123 Dry eye syndrome of bilateral lacrimal glands: Secondary | ICD-10-CM | POA: Diagnosis not present

## 2016-02-09 DIAGNOSIS — Z7951 Long term (current) use of inhaled steroids: Secondary | ICD-10-CM | POA: Diagnosis not present

## 2016-02-25 DIAGNOSIS — H04123 Dry eye syndrome of bilateral lacrimal glands: Secondary | ICD-10-CM | POA: Diagnosis not present

## 2016-02-25 DIAGNOSIS — H532 Diplopia: Secondary | ICD-10-CM | POA: Diagnosis not present

## 2016-02-25 DIAGNOSIS — H02834 Dermatochalasis of left upper eyelid: Secondary | ICD-10-CM | POA: Diagnosis not present

## 2016-02-25 DIAGNOSIS — H2513 Age-related nuclear cataract, bilateral: Secondary | ICD-10-CM | POA: Diagnosis not present

## 2016-02-25 DIAGNOSIS — Z79899 Other long term (current) drug therapy: Secondary | ICD-10-CM | POA: Diagnosis not present

## 2016-02-25 DIAGNOSIS — J449 Chronic obstructive pulmonary disease, unspecified: Secondary | ICD-10-CM | POA: Diagnosis not present

## 2016-02-25 DIAGNOSIS — H02831 Dermatochalasis of right upper eyelid: Secondary | ICD-10-CM | POA: Diagnosis not present

## 2016-02-25 DIAGNOSIS — E039 Hypothyroidism, unspecified: Secondary | ICD-10-CM | POA: Diagnosis not present

## 2016-02-25 DIAGNOSIS — Z7982 Long term (current) use of aspirin: Secondary | ICD-10-CM | POA: Diagnosis not present

## 2016-02-25 DIAGNOSIS — Z87891 Personal history of nicotine dependence: Secondary | ICD-10-CM | POA: Diagnosis not present

## 2016-02-25 DIAGNOSIS — Z7951 Long term (current) use of inhaled steroids: Secondary | ICD-10-CM | POA: Diagnosis not present

## 2016-03-12 ENCOUNTER — Other Ambulatory Visit: Payer: Self-pay | Admitting: Family Medicine

## 2016-03-13 ENCOUNTER — Encounter: Payer: Self-pay | Admitting: Family Medicine

## 2016-03-14 DIAGNOSIS — R7301 Impaired fasting glucose: Secondary | ICD-10-CM | POA: Diagnosis not present

## 2016-03-14 DIAGNOSIS — E039 Hypothyroidism, unspecified: Secondary | ICD-10-CM | POA: Diagnosis not present

## 2016-03-15 ENCOUNTER — Other Ambulatory Visit: Payer: Self-pay | Admitting: Family Medicine

## 2016-03-15 ENCOUNTER — Telehealth: Payer: Self-pay | Admitting: Family Medicine

## 2016-03-15 ENCOUNTER — Encounter: Payer: Self-pay | Admitting: Family Medicine

## 2016-03-15 ENCOUNTER — Ambulatory Visit (INDEPENDENT_AMBULATORY_CARE_PROVIDER_SITE_OTHER): Payer: Medicare Other | Admitting: Family Medicine

## 2016-03-15 VITALS — BP 138/82 | HR 66 | Ht 69.0 in | Wt 170.0 lb

## 2016-03-15 DIAGNOSIS — H532 Diplopia: Secondary | ICD-10-CM

## 2016-03-15 DIAGNOSIS — K4090 Unilateral inguinal hernia, without obstruction or gangrene, not specified as recurrent: Secondary | ICD-10-CM | POA: Diagnosis not present

## 2016-03-15 DIAGNOSIS — E039 Hypothyroidism, unspecified: Secondary | ICD-10-CM | POA: Diagnosis not present

## 2016-03-15 LAB — TSH: TSH: 3.06 mIU/L (ref 0.40–4.50)

## 2016-03-15 LAB — HEMOGLOBIN A1C
HEMOGLOBIN A1C: 5.5 % (ref <5.7)
MEAN PLASMA GLUCOSE: 111 mg/dL

## 2016-03-15 MED ORDER — LEVOTHYROXINE SODIUM 100 MCG PO TABS: 100.0000 ug | ORAL_TABLET | Freq: Every day | ORAL | 1 refills | Status: DC

## 2016-03-15 NOTE — Telephone Encounter (Signed)
Pharmacy notified.

## 2016-03-15 NOTE — Progress Notes (Signed)
Subjective:    Patient ID: Zachary Waters, male    DOB: 07/24/1940, 75 y.o.   MRN: [id number]  HPI L side groin area pt reports that he may have pulled something about a week ago. he has been doing more biking and lifting lately. He has noticed a bulge in the area. Sometimes it sore but it comes and goes it's not constant.  If the prior history of bilateral groin hernias. He says he had them repaired at birth and again when he got out of the [employer] around 1968 or 1969. He has not had a problem since then. Continues to work out regularly and this has not stopped him.  Patient comes in today also complaining of double vision. His double vision is vertical. He went to see his optometrist to get an evaluation and recommended that he see a neuro-ophthalmologist. [facility]. They recommended prism glasses. They have scheduled him from MRI in a couple of weeks. Since then he is actually been to the [place] and has further workup performed. They did extensive testing and he is awaiting his new lenses from there.  He wondered fi his  Thyroid could be affecting this.    Hypothyroid - He is asymptomatic.  We just checked his labs yesterday and TSH in the normal range Lab Results  Component Value Date   TSH 3.06 03/14/2016     Review of Systems  BP 138/82 (BP Location: Left Arm, Cuff Size: Normal)   Pulse 66   Ht 5\' 9"  (1.753 m)   Wt 170 lb (77.1 kg)   SpO2 98%   BMI 25.10 kg/m     No Known Allergies  Past Medical History:  Diagnosis Date  . COPD (chronic obstructive pulmonary disease) ([place])   . GERD (gastroesophageal reflux disease) once in a great while  . Pneumonia 10/2009  . Thyroid disease     Past Surgical History:  Procedure Laterality Date  . LEG SURGERY Right   . ROTATOR CUFF REPAIR  2005   Left  . TONSILLECTOMY      Social History   Social History  . Marital status: Married    Spouse name: N/A  . Number of children: 2  .  Years of education: N/A   Occupational History  . retired    Social History Main Topics  . Smoking status: Former Smoker    Types: Pipe    Quit date: 10/07/2009  . Smokeless tobacco: Not on file  . Alcohol use Yes  . Drug use: No  . Sexual activity: No   Other Topics Concern  . Not on file   Social History Narrative   Power walks 3 days per week for 2 miles. Golfs twice a week.     Family History  Problem Relation Age of Onset  . Prostate cancer Father 73  . Heart disease Mother 65    deceased age 21  . Depression Brother     Commited suicide  . Depression Brother     Outpatient Encounter Prescriptions as of 03/15/2016  Medication Sig  . albuterol (PROAIR HFA) 108 (90 BASE) MCG/ACT inhaler Inhale 2 puffs into the lungs every 6 (six) hours as needed.    . Ascorbic Acid (VITAMIN C PO) Take by mouth.  . budesonide-formoterol (SYMBICORT) 160-4.5 MCG/ACT inhaler Inhale 2 puffs into the lungs 2 (two) times daily.  . Fish Oil OIL by Does not apply route.  Marland Kitchen levothyroxine (SYNTHROID,  LEVOTHROID) 100 MCG tablet Take 1 tablet (100 mcg total) by mouth daily before breakfast. Take extra half a tab on Saturdays.  . montelukast (SINGULAIR) 10 MG tablet Take 1 tablet (10 mg total) by mouth at bedtime.  Marland Kitchen QUERCETIN PO Take 2 tablets by mouth 2 (two) times daily.  Marland Kitchen tiotropium (SPIRIVA HANDIHALER) 18 MCG inhalation capsule Place 1 capsule (18 mcg total) into inhaler and inhale daily.   No facility-administered encounter medications on file as of 03/15/2016.          Objective:   Physical Exam  Constitutional: He is oriented to person, place, and time. He appears well-developed and well-nourished.  HENT:  Head: Normocephalic and atraumatic.  Eyes: Conjunctivae and EOM are normal.  Cardiovascular: Normal rate.   Pulmonary/Chest: Effort normal.  Genitourinary:     Neurological: He is alert and oriented to person, place, and time.  Skin: Skin is dry. No pallor.  Psychiatric: He  has a normal mood and affect. His behavior is normal.  Vitals reviewed.      Assessment & Plan:  Left groin hernia-because he is having some tenderness and soreness recommend referral to general surgery for further evaluation. It's not urgent as there was no sign of incarceration of bowel. He says he would like to wait until January to schedule. He will call me when he is ready for the appointment.  Diploplia - continue management through the [place]. He will let me know his MRI results. Next  Hypothyroidism-I did increase his thyroid medication by 50 g per week. We'll try to get his TSH a little closer to 2.

## 2016-03-15 NOTE — Telephone Encounter (Signed)
Please call the pharmacy and canceled that levothyroxin 88 g prescription was sent earlier this morning. Sent for 100 g. This is the correct one to fill today.

## 2016-03-15 NOTE — Patient Instructions (Signed)
We will get your scheduled in January for your surgical consult Let's plan to recheck your thyroid in 6-8 weeks.

## 2016-03-17 ENCOUNTER — Ambulatory Visit: Payer: Self-pay | Admitting: Family Medicine

## 2016-03-18 ENCOUNTER — Encounter: Payer: Self-pay | Admitting: Family Medicine

## 2016-03-21 ENCOUNTER — Other Ambulatory Visit: Payer: Self-pay

## 2016-03-21 DIAGNOSIS — K4090 Unilateral inguinal hernia, without obstruction or gangrene, not specified as recurrent: Secondary | ICD-10-CM

## 2016-03-21 NOTE — Telephone Encounter (Signed)
Ok to place referral to surg in [place] ( [facility]) and let patient know when done.

## 2016-04-04 ENCOUNTER — Ambulatory Visit: Payer: Self-pay | Admitting: Surgery

## 2016-04-04 DIAGNOSIS — K4091 Unilateral inguinal hernia, without obstruction or gangrene, recurrent: Secondary | ICD-10-CM | POA: Diagnosis not present

## 2016-04-04 DIAGNOSIS — Z01818 Encounter for other preprocedural examination: Secondary | ICD-10-CM | POA: Diagnosis not present

## 2016-04-05 DIAGNOSIS — J449 Chronic obstructive pulmonary disease, unspecified: Secondary | ICD-10-CM | POA: Diagnosis not present

## 2016-04-05 DIAGNOSIS — R0602 Shortness of breath: Secondary | ICD-10-CM | POA: Diagnosis not present

## 2016-04-20 ENCOUNTER — Encounter (HOSPITAL_COMMUNITY): Payer: Self-pay | Admitting: *Deleted

## 2016-04-20 NOTE — Progress Notes (Signed)
Spoke with pt for pre-op call. Pt denies cardiac history or chest pain. Pt has COPD and does have sob at times with it.

## 2016-04-21 ENCOUNTER — Ambulatory Visit (HOSPITAL_COMMUNITY): Payer: Medicare Other | Admitting: Anesthesiology

## 2016-04-21 ENCOUNTER — Encounter (HOSPITAL_COMMUNITY)
Admission: RE | Disposition: A | Discharge status: Home / Self Care | Payer: Self-pay | Source: Ambulatory Visit | Attending: Surgery

## 2016-04-21 ENCOUNTER — Encounter (HOSPITAL_COMMUNITY): Payer: Self-pay | Admitting: *Deleted

## 2016-04-21 ENCOUNTER — Ambulatory Visit (HOSPITAL_COMMUNITY)
Admission: RE | Admit: 2016-04-21 | Discharge: 2016-04-21 | Disposition: A | Discharge status: Home / Self Care | Payer: Medicare Other | Source: Ambulatory Visit | Attending: Surgery | Admitting: Surgery

## 2016-04-21 DIAGNOSIS — Z800 Family history of malignant neoplasm of digestive organs: Secondary | ICD-10-CM | POA: Diagnosis not present

## 2016-04-21 DIAGNOSIS — K219 Gastro-esophageal reflux disease without esophagitis: Secondary | ICD-10-CM | POA: Insufficient documentation

## 2016-04-21 DIAGNOSIS — D176 Benign lipomatous neoplasm of spermatic cord: Secondary | ICD-10-CM | POA: Diagnosis not present

## 2016-04-21 DIAGNOSIS — K4190 Unilateral femoral hernia, without obstruction or gangrene, not specified as recurrent: Secondary | ICD-10-CM

## 2016-04-21 DIAGNOSIS — Z86010 Personal history of colonic polyps: Secondary | ICD-10-CM | POA: Insufficient documentation

## 2016-04-21 DIAGNOSIS — E039 Hypothyroidism, unspecified: Secondary | ICD-10-CM | POA: Insufficient documentation

## 2016-04-21 DIAGNOSIS — J449 Chronic obstructive pulmonary disease, unspecified: Secondary | ICD-10-CM | POA: Diagnosis not present

## 2016-04-21 DIAGNOSIS — K4091 Unilateral inguinal hernia, without obstruction or gangrene, recurrent: Secondary | ICD-10-CM | POA: Diagnosis not present

## 2016-04-21 DIAGNOSIS — K4020 Bilateral inguinal hernia, without obstruction or gangrene, not specified as recurrent: Secondary | ICD-10-CM | POA: Diagnosis not present

## 2016-04-21 DIAGNOSIS — Z87891 Personal history of nicotine dependence: Secondary | ICD-10-CM | POA: Insufficient documentation

## 2016-04-21 DIAGNOSIS — K4090 Unilateral inguinal hernia, without obstruction or gangrene, not specified as recurrent: Secondary | ICD-10-CM | POA: Diagnosis not present

## 2016-04-21 HISTORY — DX: Dyspnea, unspecified: R06.00

## 2016-04-21 HISTORY — PX: INGUINAL HERNIA REPAIR: SHX194

## 2016-04-21 HISTORY — DX: Other spondylosis with radiculopathy, cervical region: M47.22

## 2016-04-21 HISTORY — PX: INSERTION OF MESH: SHX5868

## 2016-04-21 LAB — BASIC METABOLIC PANEL
Anion gap: 8 (ref 5–15)
BUN: 13 mg/dL (ref 6–20)
CALCIUM: 8.6 mg/dL — AB (ref 8.9–10.3)
CO2: 24 mmol/L (ref 22–32)
CREATININE: 0.89 mg/dL (ref 0.61–1.24)
Chloride: 107 mmol/L (ref 101–111)
GLUCOSE: 95 mg/dL (ref 65–99)
Potassium: 4.3 mmol/L (ref 3.5–5.1)
Sodium: 139 mmol/L (ref 135–145)

## 2016-04-21 LAB — CBC
HEMATOCRIT: 42.7 % (ref 39.0–52.0)
Hemoglobin: 14.2 g/dL (ref 13.0–17.0)
MCH: 30.7 pg (ref 26.0–34.0)
MCHC: 33.3 g/dL (ref 30.0–36.0)
MCV: 92.2 fL (ref 78.0–100.0)
PLATELETS: 265 10*3/uL (ref 150–400)
RBC: 4.63 MIL/uL (ref 4.22–5.81)
RDW: 13.1 % (ref 11.5–15.5)
WBC: 5.8 10*3/uL (ref 4.0–10.5)

## 2016-04-21 SURGERY — REPAIR, HERNIA, INGUINAL, BILATERAL, LAPAROSCOPIC
Anesthesia: General | Site: Inguinal | Laterality: Bilateral

## 2016-04-21 MED ORDER — ACETAMINOPHEN 500 MG PO TABS
1000.0000 mg | ORAL_TABLET | ORAL | Status: AC
  Administered 2016-04-21: 1000 mg via ORAL
  Filled 2016-04-21: qty 2

## 2016-04-21 MED ORDER — ONDANSETRON HCL 4 MG/2ML IJ SOLN: 4.0000 mg | Freq: Four times a day (QID) | INTRAMUSCULAR | Status: DC | PRN

## 2016-04-21 MED ORDER — CHLORHEXIDINE GLUCONATE CLOTH 2 % EX PADS: 6.0000 | MEDICATED_PAD | Freq: Once | CUTANEOUS | Status: DC

## 2016-04-21 MED ORDER — LACTATED RINGERS IV SOLN
INTRAVENOUS | Status: DC | PRN
  Administered 2016-04-21 (×2): via INTRAVENOUS

## 2016-04-21 MED ORDER — GABAPENTIN 300 MG PO CAPS
300.0000 mg | ORAL_CAPSULE | ORAL | Status: AC
  Administered 2016-04-21: 300 mg via ORAL
  Filled 2016-04-21: qty 1

## 2016-04-21 MED ORDER — BUPIVACAINE-EPINEPHRINE (PF) 0.25% -1:200000 IJ SOLN
INTRAMUSCULAR | Status: AC
  Filled 2016-04-21: qty 30

## 2016-04-21 MED ORDER — PROPOFOL 10 MG/ML IV BOLUS
INTRAVENOUS | Status: AC
  Filled 2016-04-21: qty 20

## 2016-04-21 MED ORDER — OXYCODONE HCL 5 MG PO TABS: 5.0000 mg | ORAL_TABLET | Freq: Once | ORAL | Status: DC | PRN

## 2016-04-21 MED ORDER — EPHEDRINE SULFATE 50 MG/ML IJ SOLN
INTRAMUSCULAR | Status: DC | PRN
  Administered 2016-04-21: 10 mg via INTRAVENOUS

## 2016-04-21 MED ORDER — TRAMADOL HCL 50 MG PO TABS: 50.0000 mg | ORAL_TABLET | Freq: Four times a day (QID) | ORAL | 0 refills | Status: DC | PRN

## 2016-04-21 MED ORDER — ONDANSETRON HCL 4 MG/2ML IJ SOLN
INTRAMUSCULAR | Status: DC | PRN
  Administered 2016-04-21: 4 mg via INTRAVENOUS

## 2016-04-21 MED ORDER — PHENYLEPHRINE HCL 10 MG/ML IJ SOLN
INTRAVENOUS | Status: DC | PRN
  Administered 2016-04-21: 15 ug/min via INTRAVENOUS

## 2016-04-21 MED ORDER — OXYCODONE HCL 5 MG/5ML PO SOLN: 5.0000 mg | Freq: Once | ORAL | Status: DC | PRN

## 2016-04-21 MED ORDER — HYDROMORPHONE HCL 1 MG/ML IJ SOLN
0.2500 mg | INTRAMUSCULAR | Status: DC | PRN
  Administered 2016-04-21 (×2): 0.5 mg via INTRAVENOUS

## 2016-04-21 MED ORDER — FENTANYL CITRATE (PF) 100 MCG/2ML IJ SOLN
INTRAMUSCULAR | Status: AC
  Filled 2016-04-21: qty 4

## 2016-04-21 MED ORDER — NAPROXEN 500 MG PO TABS: 500.0000 mg | ORAL_TABLET | Freq: Two times a day (BID) | ORAL | 1 refills | Status: DC | PRN

## 2016-04-21 MED ORDER — FENTANYL CITRATE (PF) 100 MCG/2ML IJ SOLN
INTRAMUSCULAR | Status: DC | PRN
  Administered 2016-04-21: 50 ug via INTRAVENOUS
  Administered 2016-04-21: 100 ug via INTRAVENOUS
  Administered 2016-04-21: 50 ug via INTRAVENOUS

## 2016-04-21 MED ORDER — PHENYLEPHRINE HCL 10 MG/ML IJ SOLN
INTRAMUSCULAR | Status: DC | PRN
  Administered 2016-04-21: 80 ug via INTRAVENOUS

## 2016-04-21 MED ORDER — PROPOFOL 10 MG/ML IV BOLUS
INTRAVENOUS | Status: DC | PRN
  Administered 2016-04-21: 40 mg via INTRAVENOUS
  Administered 2016-04-21: 100 mg via INTRAVENOUS

## 2016-04-21 MED ORDER — LIDOCAINE HCL (CARDIAC) 20 MG/ML IV SOLN
INTRAVENOUS | Status: DC | PRN
  Administered 2016-04-21: 40 mg via INTRAVENOUS

## 2016-04-21 MED ORDER — BUPIVACAINE-EPINEPHRINE 0.25% -1:200000 IJ SOLN
INTRAMUSCULAR | Status: DC | PRN
  Administered 2016-04-21 (×2): 30 mL

## 2016-04-21 MED ORDER — LACTATED RINGERS IV SOLN
INTRAVENOUS | Status: DC
  Administered 2016-04-21: 10:00:00 via INTRAVENOUS

## 2016-04-21 MED ORDER — FENTANYL CITRATE (PF) 100 MCG/2ML IJ SOLN
INTRAMUSCULAR | Status: AC
  Filled 2016-04-21: qty 2

## 2016-04-21 MED ORDER — ROCURONIUM BROMIDE 100 MG/10ML IV SOLN
INTRAVENOUS | Status: DC | PRN
  Administered 2016-04-21: 50 mg via INTRAVENOUS

## 2016-04-21 MED ORDER — 0.9 % SODIUM CHLORIDE (POUR BTL) OPTIME
TOPICAL | Status: DC | PRN
  Administered 2016-04-21: 1000 mL

## 2016-04-21 MED ORDER — HYDROMORPHONE HCL 2 MG/ML IJ SOLN
INTRAMUSCULAR | Status: AC
  Filled 2016-04-21: qty 1

## 2016-04-21 MED ORDER — DIAZEPAM 5 MG PO TABS: 5.0000 mg | ORAL_TABLET | Freq: Four times a day (QID) | ORAL | 2 refills | Status: DC | PRN

## 2016-04-21 MED ORDER — CEFAZOLIN SODIUM-DEXTROSE 2-4 GM/100ML-% IV SOLN
2.0000 g | INTRAVENOUS | Status: AC
  Administered 2016-04-21: 2 g via INTRAVENOUS
  Filled 2016-04-21: qty 100

## 2016-04-21 MED ORDER — SUGAMMADEX SODIUM 200 MG/2ML IV SOLN
INTRAVENOUS | Status: DC | PRN
  Administered 2016-04-21: 149.6 mg via INTRAVENOUS

## 2016-04-21 MED ORDER — MIDAZOLAM HCL 2 MG/2ML IJ SOLN
INTRAMUSCULAR | Status: AC
  Filled 2016-04-21: qty 2

## 2016-04-21 SURGICAL SUPPLY — 47 items
APPLIER CLIP 5 13 M/L LIGAMAX5 (MISCELLANEOUS)
CANISTER SUCTION 2500CC (MISCELLANEOUS) IMPLANT
CHLORAPREP W/TINT 26ML (MISCELLANEOUS) ×2 IMPLANT
CLIP APPLIE 5 13 M/L LIGAMAX5 (MISCELLANEOUS) IMPLANT
COVER SURGICAL LIGHT HANDLE (MISCELLANEOUS) ×2 IMPLANT
DECANTER SPIKE VIAL GLASS SM (MISCELLANEOUS) ×4 IMPLANT
DRAPE LAPAROSCOPIC ABDOMINAL (DRAPES) ×2 IMPLANT
DRAPE WARM FLUID 44X44 (DRAPE) ×2 IMPLANT
DRSG TEGADERM 2-3/8X2-3/4 SM (GAUZE/BANDAGES/DRESSINGS) ×4 IMPLANT
DRSG TEGADERM 4X4.75 (GAUZE/BANDAGES/DRESSINGS) ×2 IMPLANT
ELECT REM PT RETURN 9FT ADLT (ELECTROSURGICAL) ×2
ELECTRODE REM PT RTRN 9FT ADLT (ELECTROSURGICAL) ×1 IMPLANT
GAUZE SPONGE 2X2 8PLY STRL LF (GAUZE/BANDAGES/DRESSINGS) ×1 IMPLANT
GLOVE BIO SURGEON STRL SZ 6.5 (GLOVE) ×2 IMPLANT
GLOVE BIOGEL PI IND STRL 6.5 (GLOVE) ×2 IMPLANT
GLOVE BIOGEL PI IND STRL 7.5 (GLOVE) ×1 IMPLANT
GLOVE BIOGEL PI IND STRL 8 (GLOVE) ×2 IMPLANT
GLOVE BIOGEL PI INDICATOR 6.5 (GLOVE) ×2
GLOVE BIOGEL PI INDICATOR 7.5 (GLOVE) ×1
GLOVE BIOGEL PI INDICATOR 8 (GLOVE) ×2
GLOVE ECLIPSE 6.5 STRL STRAW (GLOVE) ×2 IMPLANT
GLOVE ECLIPSE 8.0 STRL XLNG CF (GLOVE) ×2 IMPLANT
GLOVE SURG SS PI 6.0 STRL IVOR (GLOVE) ×2 IMPLANT
GLOVE SURG SS PI 7.5 STRL IVOR (GLOVE) ×2 IMPLANT
GOWN STRL REUS W/ TWL LRG LVL3 (GOWN DISPOSABLE) ×4 IMPLANT
GOWN STRL REUS W/ TWL XL LVL3 (GOWN DISPOSABLE) ×1 IMPLANT
GOWN STRL REUS W/TWL LRG LVL3 (GOWN DISPOSABLE) ×4
GOWN STRL REUS W/TWL XL LVL3 (GOWN DISPOSABLE) ×1
KIT BASIN OR (CUSTOM PROCEDURE TRAY) ×2 IMPLANT
KIT ROOM TURNOVER OR (KITS) ×2 IMPLANT
MESH ULTRAPRO 6X6 15CM15CM (Mesh General) ×4 IMPLANT
NEEDLE 22X1 1/2 (OR ONLY) (NEEDLE) ×2 IMPLANT
NS IRRIG 1000ML POUR BTL (IV SOLUTION) ×2 IMPLANT
PAD ARMBOARD 7.5X6 YLW CONV (MISCELLANEOUS) ×4 IMPLANT
SCISSORS LAP 5X35 DISP (ENDOMECHANICALS) ×2 IMPLANT
SET IRRIG TUBING LAPAROSCOPIC (IRRIGATION / IRRIGATOR) IMPLANT
SPONGE GAUZE 2X2 STER 10/PKG (GAUZE/BANDAGES/DRESSINGS) ×1
SUT MNCRL AB 4-0 PS2 18 (SUTURE) ×2 IMPLANT
SUT VIC AB 3-0 SH 27 (SUTURE) ×2
SUT VIC AB 3-0 SH 27XBRD (SUTURE) ×2 IMPLANT
TOWEL OR 17X24 6PK STRL BLUE (TOWEL DISPOSABLE) IMPLANT
TOWEL OR 17X26 10 PK STRL BLUE (TOWEL DISPOSABLE) ×2 IMPLANT
TRAY FOLEY CATH 16FRSI W/METER (SET/KITS/TRAYS/PACK) IMPLANT
TRAY LAPAROSCOPIC MC (CUSTOM PROCEDURE TRAY) ×2 IMPLANT
TROCAR XCEL BLUNT TIP 100MML (ENDOMECHANICALS) ×2 IMPLANT
TROCAR XCEL NON-BLD 5MMX100MML (ENDOMECHANICALS) ×4 IMPLANT
TUBING INSUFFLATION (TUBING) ×2 IMPLANT

## 2016-04-21 NOTE — Interval H&P Note (Signed)
History and Physical Interval Note:  04/21/2016 12:26 PM  Zachary Waters  has presented today for surgery, with the diagnosis of Hernias in groin.  Left recurrent inguinal hernia, possible recurrent right inguinal hernia  The various methods of treatment have been discussed with the patient and family. After consideration of risks, benefits and other options for treatment, the patient has consented to  Procedure(s): LAPAROSCOPIC LEFT POSSIBLE RIGHT  INGUINAL HERNIA REPAIR (Bilateral) INSERTION OF MESH (Bilateral) as a surgical intervention .  The patient's history has been reviewed, patient examined, no change in status, stable for surgery.  I have reviewed the patient's chart and labs.  Questions were answered to the patient's satisfaction.     Kimorah Ridolfi C.

## 2016-04-21 NOTE — Op Note (Signed)
04/21/2016  2:15 PM  PATIENT:  Zachary Waters  75 y.o. male  Patient Care Team: Hali Marry, MD as PCP - General (Family Medicine) Marcy Panning (Pulmonary Disease) Milas Kocher (Chiropractic Medicine) Michael Boston, MD as Consulting Physician (General Surgery)  PRE-OPERATIVE DIAGNOSIS:  Hernias in groin.  Left recurrent inguinal hernia, possible recurrent right inguinal hernia  POST-OPERATIVE DIAGNOSIS:    Left recurrent inguinal hernia Right femoral hernia  PROCEDURE:   LAPAROSCOPIC LEFT INGUINAL HERNIA REPAIR LAPAROSCOPIC RIGHT FEMORAL HERNIA REPAIR INSERTION OF MESH   SURGEON:  Adin Hector, MD  ASSISTANT: None  ANESTHESIA:     Regional ilioinguinal and genitofemoral and spermatic cord nerve blocks  General  EBL:  Total I/O In: 1000 [I.V.:1000] Out: - .  See anesthesia record  Delay start of Pharmacological VTE agent (>24hrs) due to surgical blood loss or risk of bleeding:  no  DRAINS: NONE  SPECIMEN:  NONE  DISPOSITION OF SPECIMEN:  N/A  COUNTS:  YES  PLAN OF CARE: Discharge to home after PACU  PATIENT DISPOSITION:  PACU - hemodynamically stable.  INDICATION: Patient with history of left and the hernia as child with evidence recurrent pain and bulging.  An open inguinal hernia repair done a while ago without any symptoms there.  I recommended laparoscopic exploration and repair of hernias found.  The anatomy & physiology of the abdominal wall and pelvic floor was discussed.  The pathophysiology of hernias in the inguinal and pelvic region was discussed.  Natural history risks such as progressive enlargement, pain, incarceration & strangulation was discussed.   Contributors to complications such as smoking, obesity, diabetes, prior surgery, etc were discussed.    I feel the risks of no intervention will lead to serious problems that outweigh the operative risks; therefore, I recommended surgery to reduce and repair the hernia.  I explained  laparoscopic techniques with possible need for an open approach.  I noted usual use of mesh to patch and/or buttress hernia repair  Risks such as bleeding, infection, abscess, need for further treatment, heart attack, death, and other risks were discussed.  I noted a good likelihood this will help address the problem.   Goals of post-operative recovery were discussed as well.  Possibility that this will not correct all symptoms was explained.  I stressed the importance of low-impact activity, aggressive pain control, avoiding constipation, & not pushing through pain to minimize risk of post-operative chronic pain or injury. Possibility of reherniation was discussed.  We will work to minimize complications.     An educational handout further explaining the pathology & treatment options was given as well.  Questions were answered.  The patient expresses understanding & wishes to proceed with surgery.  OR FINDINGS: Patient had an obvious right indirect inguinal hernia.  Some inflammation scarring consistent with prior ligation.  Moderate-sized spermatic cord lipomas noted.  Removed.  No femoral, obturator, nor direct space hernia.  On the left side inguinal hernia repair intact.  Small but definite femoral hernia.  No obturator hernia.  DESCRIPTION:  The patient was identified & brought into the operating room. The patient was positioned supine with arms tucked. SCDs were active during the entire case. The patient underwent general anesthesia without any difficulty.  The abdomen was prepped and draped in a sterile fashion. The patient's bladder was emptied.  A Surgical Timeout confirmed our plan.  I made a transverse incision through the inferior umbilical fold.  I made a small transverse nick through the anterior rectus  fascia contralateral to the inguinal hernia side and placed a 0-vicryl stitch through the fascia.  I placed a Hasson trocar into the preperitoneal plane.  Entry was clean.  We induced  carbon dioxide insufflation. Camera inspection revealed no injury.  I used a 68mm angled scope to bluntly free the peritoneum off the infraumbilical anterior abdominal wall.  I created enough of a preperitoneal pocket to place 6mm ports into the right & left mid-abdomen into this preperitoneal cavity.  I focused attention on the  LEFT side since that was the dominant hernia side.   I used blunt & focused sharp dissection to free the peritoneum off the flank and down to the pubic rim.  I freed the anteriolateral bladder wall off the anteriolateral pelvic wall, sparing midline attachments.   I located a swath of peritoneum going into a hernia fascial defect at the  internal ring consistent with  an indirect inguinal hernia..  I gradually freed the peritoneal hernia sac off safely and reduced it into the preperitoneal space.  I freed the peritoneum off the spermatic vessels & vas deferens.  I freed peritoneum off the retroperitoneum along the psoas muscle.  Spermatic cord lipoma was dissected away & removed.  I checked & assured hemostasis.  As anticipated, surgical dissection was challenged by dense adhesion and poor planes resulting in the need for careful repair of the resulting peritoneal hernia sac defects.  Repair was done with minimally invasive intracorporeal suturing using absorbable suture   I turned attention on the opposite side.  I did dissection in a similar, mirror-image fashion. The patient had a femoral hernia.Marland Kitchen   Spermatic cord lipoma was dissected away & removed.    I checked & assured hemostasis.    I chose 15x15 cm sheets of ultra-lightweight polypropylene mesh (Ultrapro), one for each side.  I cut a single sigmoid-shaped slit ~6cm from a corner of each mesh.  I placed the meshes into the preperitoneal space & laid them as overlapping diamonds such that at the inferior points, a 6x6 cm corner flap rested in the true anterolateral pelvis, covering the obturator & femoral foramina.   I allowed  the bladder to return to the pubis, this helping tuck the corners of the mesh in the anteriolateral pelvis.  The medial corners overlapped each other across midline cephalad to the pubic rim.   This provided >2 inch coverage around the hernia.  Because the defects well covered and not particularly large, I did not place any tacks.  I held the hernia sacs cephalad & evacuated carbon dioxide.  I closed the fascia with absorbable suture.  I closed the skin using 4-0 monocryl stitch.  Sterile dressings were applied.   The patient was extubated & arrived in the PACU in stable condition..  I had discussed postoperative care with the patient in the holding area.  Instructions are written in the chart.  I discussed operative findings, updated the patient's status, discussed probable steps to recovery, and gave postoperative recommendations to the patient's spouse.  Recommendations were made.  Questions were answered.  She expressed understanding & appreciation.   Adin Hector, M.D., F.A.C.S. Gastrointestinal and Minimally Invasive Surgery [facility] [address] [place], [place] [address] [phone] Main / Paging  04/21/2016 2:15 PM

## 2016-04-21 NOTE — Interval H&P Note (Signed)
History and Physical Interval Note:  04/21/2016 12:13 PM  Zachary Waters  has presented today for surgery, with the diagnosis of Hernias in groin.  Left recurrent inguinal hernia, possible recurrent right inguinal hernia  The various methods of treatment have been discussed with the patient and family. After consideration of risks, benefits and other options for treatment, the patient has consented to  Procedure(s): LAPAROSCOPIC LEFT POSSIBLE RIGHT  INGUINAL HERNIA REPAIR (Bilateral) INSERTION OF MESH (Bilateral) as a surgical intervention .  The patient's history has been reviewed, patient examined, no change in status, stable for surgery.  I have reviewed the patient's chart and labs.  Questions were answered to the patient's satisfaction.     [name] C.

## 2016-04-21 NOTE — Discharge Instructions (Signed)
HERNIA REPAIR: POST OP INSTRUCTIONS ° °###################################################################### ° °EAT °Gradually transition to a high fiber diet with a fiber supplement over the next few weeks after discharge.  Start with a pureed / full liquid diet (see below) ° °WALK °Walk an hour a day.  Control your pain to do that.   ° °CONTROL PAIN °Control pain so that you can walk, sleep, tolerate sneezing/coughing, go up/down stairs. ° °HAVE A BOWEL MOVEMENT DAILY °Keep your bowels regular to avoid problems.  OK to try a laxative to override constipation.  OK to use an antidairrheal to slow down diarrhea.  Call if not better after 2 tries ° °CALL IF YOU HAVE PROBLEMS/CONCERNS °Call if you are still struggling despite following these instructions. °Call if you have concerns not answered by these instructions ° °###################################################################### ° ° ° °1. DIET: Follow a light bland diet the first 24 hours after arrival home, such as soup, liquids, crackers, etc.  Be sure to include lots of fluids daily.  Avoid fast food or heavy meals as your are more likely to get nauseated.  Eat a low fat the next few days after surgery. °2. Take your usually prescribed home medications unless otherwise directed. °3. PAIN CONTROL: °a. Pain is best controlled by a usual combination of three different methods TOGETHER: °i. Ice/Heat °ii. Over the counter pain medication °iii. Prescription pain medication °b. Most patients will experience some swelling and bruising around the hernia(s) such as the bellybutton, groins, or old incisions.  Ice packs or heating pads (30-60 minutes up to 6 times a day) will help. Use ice for the first few days to help decrease swelling and bruising, then switch to heat to help relax tight/sore spots and speed recovery.  Some people prefer to use ice alone, heat alone, alternating between ice & heat.  Experiment to what works for you.  Swelling and bruising can take  several weeks to resolve.   °c. It is helpful to take an over-the-counter pain medication regularly for the first few weeks.  Choose one of the following that works best for you: °i. Naproxen (Aleve, etc)  Two 220mg tabs twice a day °ii. Ibuprofen (Advil, etc) Three 200mg tabs four times a day (every meal & bedtime) °iii. Acetaminophen (Tylenol, etc) 325-650mg four times a day (every meal & bedtime) °d. A  prescription for pain medication should be given to you upon discharge.  Take your pain medication as prescribed.  °i. If you are having problems/concerns with the prescription medicine (does not control pain, nausea, vomiting, rash, itching, etc), please call us [phone] to see if we need to switch you to a different pain medicine that will work better for you and/or control your side effect better. °ii. If you need a refill on your pain medication, please contact your pharmacy.  They will contact our office to request authorization. Prescriptions will not be filled after 5 pm or on week-ends. °4. Avoid getting constipated.  Between the surgery and the pain medications, it is common to experience some constipation.  Increasing fluid intake and taking a fiber supplement (such as Metamucil, Citrucel, FiberCon, MiraLax, etc) 1-2 times a day regularly will usually help prevent this problem from occurring.  A mild laxative (prune juice, Milk of Magnesia, MiraLax, etc) should be taken according to package directions if there are no bowel movements after 48 hours.   °5. Wash / shower every day.  You may shower over the dressings as they are waterproof.   °6. Remove   your waterproof bandages 5 days after surgery.  You may leave the incision open to air.  You may replace a dressing/Band-Aid to cover the incision for comfort if you wish.  Continue to shower over incision(s) after the dressing is off. ° ° ° °7. ACTIVITIES as tolerated:   °a. You may resume regular (light) daily activities beginning the next day--such  as daily self-care, walking, climbing stairs--gradually increasing activities as tolerated.  If you can walk 30 minutes without difficulty, it is safe to try more intense activity such as jogging, treadmill, bicycling, low-impact aerobics, swimming, etc. °b. Save the most intensive and strenuous activity for last such as sit-ups, heavy lifting, contact sports, etc  Refrain from any heavy lifting or straining until you are off narcotics for pain control.   °c. DO NOT PUSH THROUGH PAIN.  Let pain be your guide: If it hurts to do something, don't do it.  Pain is your body warning you to avoid that activity for another week until the pain goes down. °d. You may drive when you are no longer taking prescription pain medication, you can comfortably wear a seatbelt, and you can safely maneuver your car and apply brakes. °e. You may have sexual intercourse when it is comfortable.  °8. FOLLOW UP in our office °a. Please call [facility] at [phone] to set up an appointment to see your surgeon in the office for a follow-up appointment approximately 2-3 weeks after your surgery. °b. Make sure that you call for this appointment the day you arrive home to insure a convenient appointment time. °9.  IF YOU HAVE DISABILITY OR FAMILY LEAVE FORMS, BRING THEM TO THE OFFICE FOR PROCESSING.  DO NOT GIVE THEM TO YOUR DOCTOR. ° °WHEN TO CALL US [phone]: °1. Poor pain control °2. Reactions / problems with new medications (rash/itching, nausea, etc)  °3. Fever over 101.5 F (38.5 C) °4. Inability to urinate °5. Nausea and/or vomiting °6. Worsening swelling or bruising °7. Continued bleeding from incision. °8. Increased pain, redness, or drainage from the incision ° ° The clinic staff is available to answer your questions during regular business hours (8:30am-5pm).  Please don’t hesitate to call and ask to speak to one of our nurses for clinical concerns.  ° If you have a medical emergency, go to the nearest emergency room or call  911. ° A surgeon from [facility] is always on call at the hospitals in [place] ° °[facility] °[address], [address], [place], [place]  [address] ? ° [address] [address], [place], [place]   [address] °MAIN: [phone] ? TOLL FREE: [phone] ? FAX: [phone] °[email or web address] ° ° °

## 2016-04-21 NOTE — Anesthesia Postprocedure Evaluation (Signed)
Anesthesia Post Note  Patient: Zachary Waters  Procedure(s) Performed: Procedure(s) (LRB): LAPAROSCOPIC LEFT INGUINAL HERNIA REPAIR AND RIGHT FEMORAL HERNIA REPAIR (Bilateral) INSERTION OF MESH LEFT INGUINAL HERNIA  AND INSERTION OF MESH RIGHT FEMORAL HERNIA (Bilateral)  Patient location during evaluation: PACU Anesthesia Type: General Level of consciousness: awake and alert and patient cooperative Pain management: pain level controlled Vital Signs Assessment: post-procedure vital signs reviewed and stable Respiratory status: spontaneous breathing and respiratory function stable Cardiovascular status: stable Anesthetic complications: no    Last Vitals:  Vitals:   04/21/16 1600 04/21/16 1615  BP: (!) 103/48   Pulse: 71 73  Resp: 13 12  Temp:      Last Pain:  Vitals:   04/21/16 1500  TempSrc:   PainSc: (P) 6                  Miklos Bidinger S

## 2016-04-21 NOTE — H&P (Signed)
Zachary Waters. Zachary Waters 04/04/2016 2:24 PM Location: [facility] Patient #: [id number] DOB: 02/20/1941 Married / Language: English / Race: [place] Male  Patient Care Team: Hali Marry, MD as PCP - General (Family Medicine) Marcy Panning (Pulmonary Disease) Milas Kocher (Chiropractic Medicine) Michael Boston, MD as Consulting Physician (General Surgery)   History of Present Illness The patient is a 75 year old male who presents with an inguinal hernia. Note for "Inguinal hernia": Patient sent for surgical consultation at the request of Beatrice Lecher. [facility]. Concern for recurrent left inguinal hernia.  Pleasant active male. Extreme. Likes to golf and bicycle. History of left inguinal hernia repaired when he was very young. 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 Noted some bulging about 3 weeks ago after a more active physical day. He was concern he had a recurrent hernia. Discuss with primary care physician. Examined suspicious. Surgical consultation requested. Patient notes that bulging is out all the time. Occasional burning sensation after and active day but no severe pain. Loose his bowels once a day in the morning. No problems with urinary urgency frequency or prostatism. He does not smoke. No history of strokes or heart attack. He thinks he was diagnosed with COPD, but is not on oxygen and again is quite physically active. The inhalers seem to help control that.   Other Problems Benjiman Core, [place]; 04/04/2016 2:25 PM) Chronic Obstructive Lung Disease  Thyroid Disease   Past Surgical History Benjiman Core, [place]; 04/04/2016 2:25 PM) Colon Polyp Removal - Colonoscopy  Knee Surgery  Right. Shoulder Surgery  Right. Tonsillectomy  Vasectomy   Diagnostic Studies History Benjiman Core, [place]; 04/04/2016 2:25 PM) Colonoscopy  1-5 years ago  Allergies Benjiman Core, [place];  04/04/2016 2:26 PM) No Known Drug Allergies 04/04/2016  Medication History Benjiman Core, CMA; 04/04/2016 2:28 PM) Levothyroxine Sodium (100MCG Tablet, Oral) Active. ProAir HFA (108 (90 Base)MCG/ACT Aerosol Soln, Inhalation) Active. Ascorbic Acid (100MG  Tablet, Oral) Active. (Vitamin C) Symbicort (160-4.5MCG/ACT Aerosol, Inhalation) Active. Fish Oil (1000MG  Capsule, Oral) Active. Singulair (10MG  Tablet, Oral) Active. Quercetin (50MG  Tablet, Oral) Active. Spiriva HandiHaler (18MCG Capsule, Inhalation) Active. Medications Reconciled  Social History Benjiman Core, [place]; 04/04/2016 2:25 PM) Alcohol use  Occasional alcohol use. Caffeine use  Coffee. No drug use  Tobacco use  Former smoker.  Family History Benjiman Core, [place]; 04/04/2016 2:25 PM) Colon Cancer  Father. Depression  Brother.    Review of Systems (Armen Eulas Post CMA; 04/04/2016 2:25 PM) General Not Present- Appetite Loss, Chills, Fatigue, Fever, Night Sweats, Weight Gain and Weight Loss. Skin Not Present- Change in Wart/Mole, Dryness, Hives, Jaundice, New Lesions, Non-Healing Wounds, Rash and Ulcer. HEENT Present- Hearing Loss. Not Present- Earache, Hoarseness, Nose Bleed, Oral Ulcers, Ringing in the Ears, Seasonal Allergies, Sinus Pain, Sore Throat, Visual Disturbances, Wears glasses/contact lenses and Yellow Eyes. Breast Not Present- Breast Mass, Breast Pain, Nipple Discharge and Skin Changes. Cardiovascular Present- Shortness of Breath. Not Present- Chest Pain, Difficulty Breathing Lying Down, Leg Cramps, Palpitations, Rapid Heart Rate and Swelling of Extremities. Gastrointestinal Not Present- Abdominal Pain, Bloating, Bloody Stool, Change in Bowel Habits, Chronic diarrhea, Constipation, Difficulty Swallowing, Excessive gas, Gets full quickly at meals, Hemorrhoids, Indigestion, Nausea, Rectal Pain and Vomiting. Male Genitourinary Not Present- Blood in Urine, Change in Urinary Stream, Frequency, Impotence, Nocturia,  Painful Urination, Urgency and Urine Leakage. Musculoskeletal Not Present- Back Pain, Joint Pain, Joint Stiffness, Muscle Pain, Muscle Weakness and Swelling of Extremities. Neurological Not  Present- Decreased Memory, Fainting, Headaches, Numbness, Seizures, Tingling, Tremor, Trouble walking and Weakness. Psychiatric Not Present- Anxiety, Bipolar, Change in Sleep Pattern, Depression, Fearful and Frequent crying. Endocrine Not Present- Cold Intolerance, Excessive Hunger, Hair Changes, Heat Intolerance, Hot flashes and New Diabetes. Hematology Not Present- Blood Thinners, Easy Bruising, Excessive bleeding, Gland problems, HIV and Persistent Infections.  Vitals (Armen Glenn CMA; 04/04/2016 2:26 PM) 04/04/2016 2:25 PM Weight: 168.5 lb Height: 68in Body Surface Area: 1.9 m Body Mass Index: 25.62 kg/m  Temp.: 97.51F  Pulse: 64 (Regular)  P.OX: 94% (Room air) BP: 142/82 (Sitting, Left Arm, Standard)       Physical Exam Adin Hector MD; 04/04/2016 3:04 PM) General Mental Status-Alert. General Appearance-Not in acute distress, Not Sickly. Orientation-Oriented X3. Hydration-Well hydrated. Voice-Normal.  Integumentary Global Assessment Upon inspection and palpation of skin surfaces of the - Axillae: non-tender, no inflammation or ulceration, no drainage. and Distribution of scalp and body hair is normal. General Characteristics Temperature - normal warmth is noted.  Head and Neck Head-normocephalic, atraumatic with no lesions or palpable masses. Face Global Assessment - atraumatic, no absence of expression. Neck Global Assessment - no abnormal movements, no bruit auscultated on the right, no bruit auscultated on the left, no decreased range of motion, non-tender. Trachea-midline. Thyroid Gland Characteristics - non-tender.  Eye Eyeball - Left-Extraocular movements intact, No Nystagmus. Eyeball - Right-Extraocular movements intact, No  Nystagmus. Cornea - Left-No Hazy. Cornea - Right-No Hazy. Sclera/Conjunctiva - Left-No scleral icterus, No Discharge. Sclera/Conjunctiva - Right-No scleral icterus, No Discharge. Pupil - Left-Direct reaction to light normal. Pupil - Right-Direct reaction to light normal.  ENMT Ears Pinna - Left - no drainage observed, no generalized tenderness observed. Right - no drainage observed, no generalized tenderness observed. Nose and Sinuses External Inspection of the Nose - no destructive lesion observed. Inspection of the nares - Left - quiet respiration. Right - quiet respiration. Mouth and Throat Lips - Upper Lip - no fissures observed, no pallor noted. Lower Lip - no fissures observed, no pallor noted. Nasopharynx - no discharge present. Oral Cavity/Oropharynx - Tongue - no dryness observed. Oral Mucosa - no cyanosis observed. Hypopharynx - no evidence of airway distress observed.  Chest and Lung Exam Inspection Movements - Normal and Symmetrical. Accessory muscles - No use of accessory muscles in breathing. Palpation Palpation of the chest reveals - Non-tender. Auscultation Breath sounds - Normal and Clear.  Cardiovascular Auscultation Rhythm - Regular. Murmurs & Other Heart Sounds - Auscultation of the heart reveals - No Murmurs and No Systolic Clicks.  Abdomen Inspection Inspection of the abdomen reveals - No Visible peristalsis and No Abnormal pulsations. Umbilicus - No Bleeding, No Urine drainage. Palpation/Percussion Palpation and Percussion of the abdomen reveal - Soft, Non Tender, No Rebound tenderness, No Rigidity (guarding) and No Cutaneous hyperesthesia. Note: Abdomen soft. Nontender, nondistended. No guarding. No umbilical no other hernias   Male Genitourinary Sexual Maturity Tanner 5 - Adult hair pattern and Adult penile size and shape. Note: Obvious left inguinal hernia with prior groin incision consistent with recurrent hernia. Mild bulging but no  definite hernia on the right side. Old incision there as well. Normal external genitalia. Epididymi, testes, and spermatic cords normal without any masses.   Peripheral Vascular Upper Extremity Inspection - Left - No Cyanotic nailbeds, Not Ischemic. Right - No Cyanotic nailbeds, Not Ischemic.  Neurologic Neurologic evaluation reveals -normal attention span and ability to concentrate, able to name objects and repeat phrases. Appropriate fund of knowledge , normal sensation and  normal coordination. Mental Status Affect - not angry, not paranoid. Cranial Nerves-Normal Bilaterally. Gait-Normal.  Neuropsychiatric Mental status exam performed with findings of-able to articulate well with normal speech/language, rate, volume and coherence, thought content normal with ability to perform basic computations and apply abstract reasoning and no evidence of hallucinations, delusions, obsessions or homicidal/suicidal ideation.  Musculoskeletal Global Assessment Spine, Ribs and Pelvis - no instability, subluxation or laxity. Right Upper Extremity - no instability, subluxation or laxity.  Lymphatic Head & Neck  General Head & Neck Lymphatics: Bilateral - Description - No Localized lymphadenopathy. Axillary  General Axillary Region: Bilateral - Description - No Localized lymphadenopathy. Femoral & Inguinal  Generalized Femoral & Inguinal Lymphatics: Left - Description - No Localized lymphadenopathy. Right - Description - No Localized lymphadenopathy.    Assessment & Plan RECURRENT LEFT INGUINAL HERNIA (K40.91) Impression: Obvious left inguinal hernia. Recurrent. No definite hernia on the right but I wonder. No umbilical hernia. No diastases.  I think he would benefit from laparoscopic expiration repair of hernias.  PREOP - ING HERNIA - ENCOUNTER FOR PREOPERATIVE EXAMINATION FOR GENERAL SURGICAL PROCEDURE (Z01.818) Current Plans You are being scheduled for surgery - Our schedulers  will call you.  You should hear from our office's scheduling department within 5 working days about the location, date, and time of surgery. We try to make accommodations for patient's preferences in scheduling surgery, but sometimes the OR schedule or the surgeon's schedule prevents [place] from making those accommodations.  If you have not heard from our office [phone]) in 5 working days, call the office and ask for your surgeon's nurse.  If you have other questions about your diagnosis, plan, or surgery, call the office and ask for your surgeon's nurse.  Written instructions provided The anatomy & physiology of the abdominal wall and pelvic floor was discussed. The pathophysiology of hernias in the inguinal and pelvic region was discussed. Natural history risks such as progressive enlargement, pain, incarceration, and strangulation was discussed. Contributors to complications such as smoking, obesity, diabetes, prior surgery, etc were discussed.  I feel the risks of no intervention will lead to serious problems that outweigh the operative risks; therefore, I recommended surgery to reduce and repair the hernia. I explained laparoscopic techniques with possible need for an open approach. I noted usual use of mesh to patch and/or buttress hernia repair  Risks such as bleeding, infection, abscess, need for further treatment, heart attack, death, and other risks were discussed. I noted a good likelihood this will help address the problem. Goals of post-operative recovery were discussed as well. Possibility that this will not correct all symptoms was explained. I stressed the importance of low-impact activity, aggressive pain control, avoiding constipation, & not pushing through pain to minimize risk of post-operative chronic pain or injury. Possibility of reherniation was discussed. We will work to minimize complications.  An educational handout further explaining the pathology & treatment options was  given as well. Questions were answered. The patient expresses understanding & wishes to proceed with surgery.  Pt Education - Pamphlet Given - Laparoscopic Hernia Repair: discussed with patient and provided information. Pt Education - [facility] Pain Control (Viggo Perko) Pt Education - [facility] Hernia Post-Op HCI (Milee Qualls): discussed with patient and provided information.  Adin Hector, M.D., F.A.C.S. Gastrointestinal and Minimally Invasive Surgery [facility] [address] [place], [place] [address] [phone] Main / Paging

## 2016-04-21 NOTE — Anesthesia Procedure Notes (Signed)
Procedure Name: Intubation Date/Time: 04/21/2016 12:46 PM Performed by: Eligha Bridegroom Pre-anesthesia Checklist: Patient identified, Suction available, Patient being monitored and Timeout performed Patient Re-evaluated:Patient Re-evaluated prior to inductionOxygen Delivery Method: Circle system utilized Preoxygenation: Pre-oxygenation with 100% oxygen Ventilation: Mask ventilation without difficulty Laryngoscope Size: 4 Grade View: Grade I Tube type: Oral Tube size: 7.5 mm Number of attempts: 1 Airway Equipment and Method: Stylet Placement Confirmation: ETT inserted through vocal cords under direct vision,  positive ETCO2 and breath sounds checked- equal and bilateral Secured at: 21 cm Tube secured with: Tape Dental Injury: Teeth and Oropharynx as per pre-operative assessment

## 2016-04-21 NOTE — Transfer of Care (Signed)
Immediate Anesthesia Transfer of Care Note  Patient: Daytron Gulan  Procedure(s) Performed: Procedure(s): LAPAROSCOPIC LEFT INGUINAL HERNIA REPAIR AND RIGHT FEMORAL HERNIA REPAIR (Bilateral) INSERTION OF MESH LEFT INGUINAL HERNIA  AND INSERTION OF MESH RIGHT FEMORAL HERNIA (Bilateral)  Patient Location: PACU  Anesthesia Type:General  Level of Consciousness: awake and alert   Airway & Oxygen Therapy: Patient Spontanous Breathing and Patient connected to nasal cannula oxygen  Post-op Assessment: Report given to RN and Post -op Vital signs reviewed and stable  Post vital signs: Reviewed and stable  Last Vitals:  Vitals:   04/21/16 0907  BP: (!) 169/69  Pulse: 70  Resp: 18  Temp: 36.7 C    Last Pain:  Vitals:   04/21/16 0907  TempSrc: Oral         Complications: No apparent anesthesia complications

## 2016-04-21 NOTE — Anesthesia Preprocedure Evaluation (Signed)
Anesthesia Evaluation  Patient identified by MRN, date of birth, ID band Patient awake    Reviewed: Allergy & Precautions, H&P , NPO status , Patient's Chart, lab work & pertinent test results  Airway Mallampati: II   Neck ROM: full    Dental   Pulmonary shortness of breath, COPD, former smoker,    breath sounds clear to auscultation       Cardiovascular negative cardio ROS   Rhythm:regular Rate:Normal     Neuro/Psych    GI/Hepatic GERD  ,  Endo/Other  Hypothyroidism   Renal/GU      Musculoskeletal   Abdominal   Peds  Hematology   Anesthesia Other Findings   Reproductive/Obstetrics                             Anesthesia Physical Anesthesia Plan  ASA: II  Anesthesia Plan: General   Post-op Pain Management:    Induction: Intravenous  Airway Management Planned: Oral ETT  Additional Equipment:   Intra-op Plan:   Post-operative Plan: Extubation in OR  Informed Consent: I have reviewed the patients History and Physical, chart, labs and discussed the procedure including the risks, benefits and alternatives for the proposed anesthesia with the patient or authorized representative who has indicated his/her understanding and acceptance.     Plan Discussed with: CRNA, Anesthesiologist and Surgeon  Anesthesia Plan Comments:         Anesthesia Quick Evaluation

## 2016-04-22 ENCOUNTER — Encounter (HOSPITAL_COMMUNITY): Payer: Self-pay | Admitting: Surgery

## 2016-05-04 ENCOUNTER — Other Ambulatory Visit: Payer: Self-pay

## 2016-05-04 ENCOUNTER — Encounter: Payer: Self-pay | Admitting: Family Medicine

## 2016-05-04 DIAGNOSIS — E038 Other specified hypothyroidism: Secondary | ICD-10-CM

## 2016-05-06 LAB — TSH: TSH: 1.12 m[IU]/L (ref 0.40–4.50)

## 2016-05-10 ENCOUNTER — Ambulatory Visit (INDEPENDENT_AMBULATORY_CARE_PROVIDER_SITE_OTHER): Payer: Medicare Other | Admitting: Family Medicine

## 2016-05-10 ENCOUNTER — Encounter: Payer: Self-pay | Admitting: Family Medicine

## 2016-05-10 VITALS — BP 136/72 | HR 75 | Ht 68.0 in | Wt 168.0 lb

## 2016-05-10 DIAGNOSIS — E038 Other specified hypothyroidism: Secondary | ICD-10-CM

## 2016-05-10 DIAGNOSIS — R030 Elevated blood-pressure reading, without diagnosis of hypertension: Secondary | ICD-10-CM | POA: Diagnosis not present

## 2016-05-10 NOTE — Progress Notes (Signed)
Subjective:    CC: 2 mo f/U  HPI:  Hypothyroid -  Last TSH was 3.  We adjusted his regimen to see if would help with his double vision. He now has prism glasses.   Lab Results  Component Value Date   TSH 1.12 05/04/2016   Blood pressure was quite elevated when he went in for his hernia surgery.  He did have his hernia repair surgery. He si doing well. Incisions are healing well.   Past medical history, Surgical history, Family history not pertinant except as noted below, Social history, Allergies, and medications have been entered into the medical record, reviewed, and corrections made.   Review of Systems: No fevers, chills, night sweats, weight loss, chest pain, or shortness of breath.   Objective:    General: Well Developed, well nourished, and in no acute distress.  Neuro: Alert and oriented x3, extra-ocular muscles intact, sensation grossly intact.  HEENT: Normocephalic, atraumatic. No thyromegaly.   Skin: Warm and dry, no rashes. Cardiac: Regular rate and rhythm, no murmurs rubs or gallops, no lower extremity edema.  Respiratory: Clear to auscultation bilaterally. Not using accessory muscles, speaking in full sentences.   Impression and Recommendations:   Hypothyroid - well controlled. F/U in 3-6 months.    Elevated BP - Repeat blood pressure at goal. We'll continue to monitor carefully.

## 2016-05-24 ENCOUNTER — Encounter: Payer: Self-pay | Admitting: Family Medicine

## 2016-06-02 ENCOUNTER — Encounter: Payer: Self-pay | Admitting: Family Medicine

## 2016-06-14 DIAGNOSIS — D122 Benign neoplasm of ascending colon: Secondary | ICD-10-CM | POA: Diagnosis not present

## 2016-06-14 DIAGNOSIS — D123 Benign neoplasm of transverse colon: Secondary | ICD-10-CM | POA: Diagnosis not present

## 2016-06-14 DIAGNOSIS — K5730 Diverticulosis of large intestine without perforation or abscess without bleeding: Secondary | ICD-10-CM | POA: Diagnosis not present

## 2016-06-14 DIAGNOSIS — Z86010 Personal history of colonic polyps: Secondary | ICD-10-CM | POA: Diagnosis not present

## 2016-06-14 LAB — HM COLONOSCOPY

## 2016-06-15 DIAGNOSIS — Z86010 Personal history of colonic polyps: Secondary | ICD-10-CM | POA: Insufficient documentation

## 2016-07-03 ENCOUNTER — Encounter: Payer: Self-pay | Admitting: Family Medicine

## 2016-07-06 ENCOUNTER — Encounter: Payer: Self-pay | Admitting: Family Medicine

## 2016-07-17 ENCOUNTER — Emergency Department
Admission: EM | Admit: 2016-07-17 | Discharge: 2016-07-17 | Disposition: A | Discharge status: Home / Self Care | Payer: Medicare Other | Source: Home / Self Care | Attending: Family Medicine | Admitting: Family Medicine

## 2016-07-17 ENCOUNTER — Encounter: Payer: Self-pay | Admitting: Emergency Medicine

## 2016-07-17 ENCOUNTER — Encounter: Payer: Self-pay | Admitting: Family Medicine

## 2016-07-17 DIAGNOSIS — R030 Elevated blood-pressure reading, without diagnosis of hypertension: Secondary | ICD-10-CM

## 2016-07-17 DIAGNOSIS — B029 Zoster without complications: Secondary | ICD-10-CM | POA: Diagnosis not present

## 2016-07-17 MED ORDER — HYDROCODONE-ACETAMINOPHEN 5-325 MG PO TABS: 1.0000 | ORAL_TABLET | Freq: Four times a day (QID) | ORAL | 0 refills | Status: DC | PRN

## 2016-07-17 MED ORDER — VALACYCLOVIR HCL 1 G PO TABS
1000.0000 mg | ORAL_TABLET | Freq: Three times a day (TID) | ORAL | 0 refills | Status: DC
Start: 2016-07-17 — End: 2016-07-26

## 2016-07-17 NOTE — ED Provider Notes (Signed)
CSN: [id number]     Arrival date & time 07/17/16  1338 History   First MD Initiated Contact with Patient 07/17/16 1400     Chief Complaint  Patient presents with  . Abdominal Pain  . Rash   (Consider location/radiation/quality/duration/timing/severity/associated sxs/prior Treatment) HPI Zachary Waters is a 76 y.o. male presenting to UC with c/o Right lower abdominal pain for about 2 weeks, worsened last night and noticed a rash along area of pain this morning. Pain has been aching and sore. He initially thought it was due to getting back into golf after 2 months. He reports having hernia surgery in December 2017 and was fully recovered and cleared to start his normal exercise routine.  Pain is 5/10 at this time. Denies fever, chills, n/v/d. Denies difficulty voiding. He did have chicken pox as a child as well as the shingles vaccine.    Past Medical History:  Diagnosis Date  . Cervical spondylosis with radiculopathy 03/08/2012  . COPD (chronic obstructive pulmonary disease) ([place])   . Dyspnea   . GERD (gastroesophageal reflux disease) once in a great while  . Pneumonia 10/2009  . Thyroid disease    Past Surgical History:  Procedure Laterality Date  . COLONOSCOPY    . HERNIA REPAIR     inguinal hernia repair on right and left  . INGUINAL HERNIA REPAIR Bilateral 04/21/2016   Procedure: LAPAROSCOPIC LEFT INGUINAL HERNIA REPAIR AND RIGHT FEMORAL HERNIA REPAIR;  Surgeon: Michael Boston, MD;  Location: [facility];  Service: General;  Laterality: Bilateral;  . INSERTION OF MESH Bilateral 04/21/2016   Procedure: INSERTION OF MESH LEFT INGUINAL HERNIA  AND INSERTION OF MESH RIGHT FEMORAL HERNIA;  Surgeon: Michael Boston, MD;  Location: [facility];  Service: General;  Laterality: Bilateral;  . LEG SURGERY Right   . ROTATOR CUFF REPAIR  2005   Left  . TONSILLECTOMY     Family History  Problem Relation Age of Onset  . Prostate cancer Father 29  . Heart disease Mother 96    deceased age 33  .  Depression Brother     Commited suicide  . Depression Brother    Social History  Substance Use Topics  . Smoking status: Former Smoker    Types: Pipe    Quit date: 10/07/2009  . Smokeless tobacco: Never Used  . Alcohol use Yes     Comment: occasional    Review of Systems  Constitutional: Negative for chills and fever.  Gastrointestinal: Positive for abdominal pain (Right lower side). Negative for diarrhea, nausea and vomiting.  Musculoskeletal: Positive for back pain and myalgias. Negative for arthralgias.  Skin: Positive for rash. Negative for color change and wound.  Neurological: Negative for weakness and numbness.    Allergies  No known allergies  Home Medications   Prior to Admission medications   Medication Sig Start Date End Date Taking? Authorizing Provider  albuterol (PROAIR HFA) 108 (90 BASE) MCG/ACT inhaler Inhale 2 puffs into the lungs every 6 (six) hours as needed.      Historical Provider, MD  budesonide-formoterol (SYMBICORT) 160-4.5 MCG/ACT inhaler Inhale 2 puffs into the lungs 2 (two) times daily.    Historical Provider, MD  HYDROcodone-acetaminophen (NORCO/VICODIN) 5-325 MG tablet Take 1 tablet by mouth every 6 (six) hours as needed for moderate pain or severe pain. 07/17/16   Noland Fordyce, PA-C  levothyroxine (SYNTHROID, LEVOTHROID) 100 MCG tablet Take 1 tablet (100 mcg total) by mouth daily before breakfast. Take extra half a tab on Saturdays. 03/15/16  Hali Marry, MD  Omega-3 Fatty Acids (FISH OIL) 1000 MG CAPS Take 1 capsule by mouth daily.    Historical Provider, MD  QUERCETIN PO Take 2 tablets by mouth daily.     Historical Provider, MD  tiotropium (SPIRIVA HANDIHALER) 18 MCG inhalation capsule Place 1 capsule (18 mcg total) into inhaler and inhale daily. Patient taking differently: Place 18 mcg into inhaler and inhale 2 (two) times daily.  01/10/14   Hali Marry, MD  valACYclovir (VALTREX) 1000 MG tablet Take 1 tablet (1,000 mg total) by  mouth 3 (three) times daily. 07/17/16   Noland Fordyce, PA-C  vitamin C (ASCORBIC ACID) 500 MG tablet Take 500 mg by mouth daily.    Historical Provider, MD   Meds Ordered and Administered this Visit  Medications - No data to display  BP 179/80 (BP Location: Right Arm)   Pulse (!) 58   Temp 97.3 F (36.3 C) (Oral)   Resp 16   Ht 5' 8.5" (1.74 m)   Wt 173 lb 8 oz (78.7 kg)   SpO2 97%   BMI 26.00 kg/m  No data found.   Physical Exam  Constitutional: He is oriented to person, place, and time. He appears well-developed and well-nourished. No distress.  HENT:  Head: Normocephalic and atraumatic.  Nose: Nose normal.  Mouth/Throat: Oropharynx is clear and moist.  Eyes: EOM are normal.  Neck: Normal range of motion.  Cardiovascular: Normal rate and regular rhythm.   Pulmonary/Chest: Effort normal. No respiratory distress.  Abdominal: Soft. He exhibits no distension. There is tenderness ( along rash (see skin exam)).  Musculoskeletal: Normal range of motion. He exhibits tenderness. He exhibits no edema.  Neurological: He is alert and oriented to person, place, and time.  Skin: Skin is warm and dry. Rash noted. He is not diaphoretic. There is erythema.  Erythematous papular rash along right side dermatomal distribution from mid to lower back around to Right lower abdomen. No vesicles. No bleeding or discharge. Rash is tender to light touch.  Psychiatric: He has a normal mood and affect. His behavior is normal.  Nursing note and vitals reviewed.   Urgent Care Course     Procedures (including critical care time)  Labs Review Labs Reviewed - No data to display  Imaging Review No results found.   MDM   1. Herpes zoster without complication   2. Elevated blood pressure reading    Hx and exam c/w herpes zoster w/o complication  Pain is mild to moderate but was worse last night. Rx: Valacyclovir, norco (for moderate to severe pain at night)  May take acetaminophen and  ibuprofen during the day. F/u with PCP in 1-2 weeks as needed.     Noland Fordyce, PA-C 07/17/16 1527

## 2016-07-17 NOTE — Discharge Instructions (Signed)
°  Norco/Vicodin (hydrocodone-acetaminophen) is a narcotic pain medication, do not combine these medications with others containing tylenol. While taking, do not drink alcohol, drive, or perform any other activities that requires focus while taking these medications.   Your blood pressure was elevated today but that could be due to the pain you are having from the shingles.  Please have it rechecked by Dr. Madilyn Fireman.

## 2016-07-17 NOTE — ED Triage Notes (Signed)
Patient presents to [facility] with C/O pain in the right lower abdomen over the last two weeks. Rash noticed on right side of abd and right mid back.

## 2016-07-19 ENCOUNTER — Encounter: Payer: Self-pay | Admitting: Family Medicine

## 2016-07-19 ENCOUNTER — Other Ambulatory Visit: Payer: Self-pay | Admitting: Family Medicine

## 2016-07-19 MED ORDER — HYDROCODONE-ACETAMINOPHEN 10-325 MG PO TABS: 1.0000 | ORAL_TABLET | ORAL | 0 refills | Status: DC | PRN

## 2016-07-19 NOTE — Progress Notes (Signed)
ydroco

## 2016-07-20 ENCOUNTER — Encounter: Payer: Self-pay | Admitting: Family Medicine

## 2016-07-22 DIAGNOSIS — E038 Other specified hypothyroidism: Secondary | ICD-10-CM | POA: Diagnosis not present

## 2016-07-23 LAB — TSH: TSH: 4.41 mIU/L (ref 0.40–4.50)

## 2016-07-24 ENCOUNTER — Other Ambulatory Visit: Payer: Self-pay | Admitting: Family Medicine

## 2016-07-24 MED ORDER — LEVOTHYROXINE SODIUM 112 MCG PO TABS: 112.0000 ug | ORAL_TABLET | Freq: Every day | ORAL | 1 refills | Status: DC

## 2016-07-26 ENCOUNTER — Ambulatory Visit (INDEPENDENT_AMBULATORY_CARE_PROVIDER_SITE_OTHER): Payer: Medicare Other | Admitting: Family Medicine

## 2016-07-26 ENCOUNTER — Encounter: Payer: Self-pay | Admitting: Family Medicine

## 2016-07-26 VITALS — BP 130/78

## 2016-07-26 DIAGNOSIS — R030 Elevated blood-pressure reading, without diagnosis of hypertension: Secondary | ICD-10-CM | POA: Diagnosis not present

## 2016-07-26 DIAGNOSIS — E039 Hypothyroidism, unspecified: Secondary | ICD-10-CM | POA: Diagnosis not present

## 2016-07-26 DIAGNOSIS — B029 Zoster without complications: Secondary | ICD-10-CM | POA: Diagnosis not present

## 2016-07-26 NOTE — Progress Notes (Signed)
Subjective:    Patient ID: Zachary Waters, male    DOB: Sep 20, 1940, 75 y.o.   MRN: [id number]  HPI 76 year old male comes in today to follow-up on shingles. He had pain over his right lower abdomen across the right lower quadrant for about a week and a half. He thought it could be a complication from his recent hernia surgery. He actually is going to urgent care on March 11 and was seen and evaluated. By then he had she had a small area with rash and was diagnosed with shingles. She was placed on an antiviral and given pain medication but it wasn't as effective. He had called here and we sent him for a little stronger. He came off of the narcotic pain medication on Saturday and says that he's had some increased pain for a couple days afterwards but ataxia seems to be gradually getting better. He says the rash is completely dry now that he still using the lidocaine gel some.  Hypthyroidism-she's been taking his thyroid medications regularly. He had a follow-up appointment next week so went ahead and had his blood work drawn so that we could address it today. He denies any symptoms such as weight change etc.  Elevated blood pressure level without diagnosis of hypertension - when he went to urgent care his blood pressure was very elevated at 179/80. He says often elevated when he comes to the doctor's office but when he checks it at the gym usually looks great. Typically in the 120s.  Review of Systems   BP 130/78     Allergies  Allergen Reactions  . No Known Allergies     Past Medical History:  Diagnosis Date  . Cervical spondylosis with radiculopathy 03/08/2012  . COPD (chronic obstructive pulmonary disease) ([place])   . Dyspnea   . GERD (gastroesophageal reflux disease) once in a great while  . Pneumonia 10/2009  . Thyroid disease     Past Surgical History:  Procedure Laterality Date  . COLONOSCOPY    . HERNIA REPAIR     inguinal hernia repair on right and left  . INGUINAL  HERNIA REPAIR Bilateral 04/21/2016   Procedure: LAPAROSCOPIC LEFT INGUINAL HERNIA REPAIR AND RIGHT FEMORAL HERNIA REPAIR;  Surgeon: Michael Boston, MD;  Location: [facility];  Service: General;  Laterality: Bilateral;  . INSERTION OF MESH Bilateral 04/21/2016   Procedure: INSERTION OF MESH LEFT INGUINAL HERNIA  AND INSERTION OF MESH RIGHT FEMORAL HERNIA;  Surgeon: Michael Boston, MD;  Location: [facility];  Service: General;  Laterality: Bilateral;  . LEG SURGERY Right   . ROTATOR CUFF REPAIR  2005   Left  . TONSILLECTOMY      Social History   Social History  . Marital status: Married    Spouse name: N/A  . Number of children: 2  . Years of education: N/A   Occupational History  . retired    Social History Main Topics  . Smoking status: Former Smoker    Types: Pipe    Quit date: 10/07/2009  . Smokeless tobacco: Never Used  . Alcohol use Yes     Comment: occasional  . Drug use: No  . Sexual activity: No   Other Topics Concern  . Not on file   Social History Narrative   Power walks 3 days per week for 2 miles. Golfs twice a week.     Family History  Problem Relation Age of Onset  . Prostate cancer Father 66  . Heart disease Mother  27    deceased age 6  . Depression Brother     Commited suicide  . Depression Brother     Outpatient Encounter Prescriptions as of 07/26/2016  Medication Sig  . albuterol (PROAIR HFA) 108 (90 BASE) MCG/ACT inhaler Inhale 2 puffs into the lungs every 6 (six) hours as needed.    . budesonide-formoterol (SYMBICORT) 160-4.5 MCG/ACT inhaler Inhale 2 puffs into the lungs 2 (two) times daily.  Marland Kitchen levothyroxine (SYNTHROID, LEVOTHROID) 112 MCG tablet Take 1 tablet (112 mcg total) by mouth daily before breakfast.  . Omega-3 Fatty Acids (FISH OIL) 1000 MG CAPS Take 1 capsule by mouth daily.  Marland Kitchen QUERCETIN PO Take 2 tablets by mouth daily. Pt reports taking 1 tablet daily  . tiotropium (SPIRIVA HANDIHALER) 18 MCG inhalation capsule Place 1 capsule (18 mcg total) into  inhaler and inhale daily. (Patient taking differently: Place 18 mcg into inhaler and inhale 2 (two) times daily. )  . vitamin C (ASCORBIC ACID) 500 MG tablet Take 500 mg by mouth daily.  . [DISCONTINUED] HYDROcodone-acetaminophen (NORCO) 10-325 MG tablet Take 1 tablet by mouth every 4 (four) hours as needed.  . [DISCONTINUED] valACYclovir (VALTREX) 1000 MG tablet Take 1 tablet (1,000 mg total) by mouth 3 (three) times daily.   No facility-administered encounter medications on file as of 07/26/2016.         Objective:   Physical Exam  Constitutional: He is oriented to person, place, and time. He appears well-developed and well-nourished.  HENT:  Head: Normocephalic and atraumatic.  Eyes: Conjunctivae and EOM are normal.  Cardiovascular: Normal rate.   Pulmonary/Chest: Effort normal.  Musculoskeletal:  Erythematous dry scaling rash scattered orf right posterior flank around the right lower quad abdomen.    Neurological: He is alert and oriented to person, place, and time.  Skin: Skin is dry. No pallor.  Psychiatric: He has a normal mood and affect. His behavior is normal.  Vitals reviewed.       Assessment & Plan:  Shingles-his rash looks like it's in the healing phase. Hopefully over the next 7-10 days it should resolve completely and hopefully his pain will as well. We did discuss the possibility of postherpetic neuralgia. He did actually receive a shingles vaccine about 7 years ago.  Hypothyroidism-due to recheck TSH in about 8 weeks. We increased his thyroid medication 212 g daily. This is only about an increase of 34 g for the week.  Elevated BP without diagnosis of hypertension-repeat blood pressure before he left today looked great. Encouraged him just to keep an eye on it. Also encouraged him to consider getting his own machine at home so that he can bring them for comparison. I suspect she probably has whitecoat hypertension.

## 2016-07-27 DIAGNOSIS — Z85828 Personal history of other malignant neoplasm of skin: Secondary | ICD-10-CM | POA: Diagnosis not present

## 2016-07-27 DIAGNOSIS — Z08 Encounter for follow-up examination after completed treatment for malignant neoplasm: Secondary | ICD-10-CM | POA: Diagnosis not present

## 2016-07-28 ENCOUNTER — Encounter: Payer: Self-pay | Admitting: Family Medicine

## 2016-08-02 ENCOUNTER — Ambulatory Visit: Payer: Self-pay | Admitting: Family Medicine

## 2016-10-11 ENCOUNTER — Encounter: Payer: Self-pay | Admitting: Family Medicine

## 2016-10-11 DIAGNOSIS — E039 Hypothyroidism, unspecified: Secondary | ICD-10-CM | POA: Diagnosis not present

## 2016-10-11 DIAGNOSIS — J449 Chronic obstructive pulmonary disease, unspecified: Secondary | ICD-10-CM | POA: Diagnosis not present

## 2016-10-11 DIAGNOSIS — R0602 Shortness of breath: Secondary | ICD-10-CM | POA: Diagnosis not present

## 2016-10-11 DIAGNOSIS — Z87891 Personal history of nicotine dependence: Secondary | ICD-10-CM | POA: Diagnosis not present

## 2016-10-11 DIAGNOSIS — E038 Other specified hypothyroidism: Secondary | ICD-10-CM

## 2016-10-11 DIAGNOSIS — R0609 Other forms of dyspnea: Secondary | ICD-10-CM | POA: Diagnosis not present

## 2016-10-13 LAB — TSH: TSH: 2.38 m[IU]/L (ref 0.40–4.50)

## 2016-10-14 ENCOUNTER — Encounter: Payer: Self-pay | Admitting: Family Medicine

## 2016-10-20 DIAGNOSIS — Z87891 Personal history of nicotine dependence: Secondary | ICD-10-CM | POA: Diagnosis not present

## 2016-11-01 ENCOUNTER — Encounter: Payer: Self-pay | Admitting: Family Medicine

## 2016-11-01 DIAGNOSIS — I2510 Atherosclerotic heart disease of native coronary artery without angina pectoris: Secondary | ICD-10-CM | POA: Insufficient documentation

## 2016-11-10 ENCOUNTER — Encounter: Payer: Self-pay | Admitting: Family Medicine

## 2016-11-10 DIAGNOSIS — R0602 Shortness of breath: Secondary | ICD-10-CM | POA: Diagnosis not present

## 2016-11-15 ENCOUNTER — Telehealth: Payer: Self-pay | Admitting: Family Medicine

## 2016-11-15 ENCOUNTER — Encounter: Payer: Self-pay | Admitting: Family Medicine

## 2016-11-15 NOTE — Telephone Encounter (Signed)
Left detailed vm with recommendations. Requested a call back with concerns.

## 2016-11-15 NOTE — Telephone Encounter (Signed)
Call patient: Zachary Waters I said hello and hip is doing well. Echocardiogram that he had done at [facility] overall looks great. The pumping function of the heart, EF was 60-65% which is actually normal. The diastolic function was normal. Overall the valves look good.

## 2016-12-07 ENCOUNTER — Encounter: Payer: Self-pay | Admitting: Family Medicine

## 2016-12-28 ENCOUNTER — Encounter: Payer: Self-pay | Admitting: Family Medicine

## 2017-01-27 ENCOUNTER — Other Ambulatory Visit: Payer: Self-pay | Admitting: Family Medicine

## 2017-01-28 ENCOUNTER — Encounter: Payer: Self-pay | Admitting: Family Medicine

## 2017-01-28 DIAGNOSIS — R7301 Impaired fasting glucose: Secondary | ICD-10-CM

## 2017-01-28 DIAGNOSIS — E7800 Pure hypercholesterolemia, unspecified: Secondary | ICD-10-CM

## 2017-01-28 DIAGNOSIS — E038 Other specified hypothyroidism: Secondary | ICD-10-CM

## 2017-01-31 DIAGNOSIS — E038 Other specified hypothyroidism: Secondary | ICD-10-CM | POA: Diagnosis not present

## 2017-01-31 DIAGNOSIS — E7800 Pure hypercholesterolemia, unspecified: Secondary | ICD-10-CM | POA: Diagnosis not present

## 2017-01-31 DIAGNOSIS — R7301 Impaired fasting glucose: Secondary | ICD-10-CM | POA: Diagnosis not present

## 2017-02-01 DIAGNOSIS — L570 Actinic keratosis: Secondary | ICD-10-CM | POA: Diagnosis not present

## 2017-02-01 DIAGNOSIS — Z85828 Personal history of other malignant neoplasm of skin: Secondary | ICD-10-CM | POA: Diagnosis not present

## 2017-02-01 DIAGNOSIS — Z08 Encounter for follow-up examination after completed treatment for malignant neoplasm: Secondary | ICD-10-CM | POA: Diagnosis not present

## 2017-02-01 LAB — LIPID PANEL W/REFLEX DIRECT LDL
Cholesterol: 212 mg/dL — ABNORMAL HIGH (ref <200)
HDL: 73 mg/dL (ref >40)
LDL CHOLESTEROL (CALC): 120 mg/dL — AB
Non-HDL Cholesterol (Calc): 139 mg/dL (calc) — ABNORMAL HIGH (ref <130)
TRIGLYCERIDES: 93 mg/dL (ref <150)
Total CHOL/HDL Ratio: 2.9 (calc) (ref <5.0)

## 2017-02-01 LAB — COMPLETE METABOLIC PANEL WITH GFR
AG Ratio: 2.4 (calc) (ref 1.0–2.5)
ALBUMIN MSPROF: 4.5 g/dL (ref 3.6–5.1)
ALT: 12 U/L (ref 9–46)
AST: 19 U/L (ref 10–35)
Alkaline phosphatase (APISO): 53 U/L (ref 40–115)
BILIRUBIN TOTAL: 0.5 mg/dL (ref 0.2–1.2)
BUN: 19 mg/dL (ref 7–25)
CO2: 24 mmol/L (ref 20–32)
CREATININE: 0.98 mg/dL (ref 0.70–1.18)
Calcium: 8.9 mg/dL (ref 8.6–10.3)
Chloride: 102 mmol/L (ref 98–110)
GFR, EST AFRICAN AMERICAN: 88 mL/min/{1.73_m2} (ref >=60)
GFR, Est Non African American: 76 mL/min/{1.73_m2} (ref >=60)
GLUCOSE: 80 mg/dL (ref 65–99)
Globulin: 1.9 g/dL (calc) (ref 1.9–3.7)
Potassium: 4.7 mmol/L (ref 3.5–5.3)
Sodium: 135 mmol/L (ref 135–146)
TOTAL PROTEIN: 6.4 g/dL (ref 6.1–8.1)

## 2017-02-01 LAB — HEMOGLOBIN A1C
EAG (MMOL/L): 6.3 (calc)
Hgb A1c MFr Bld: 5.6 % of total Hgb (ref <5.7)
Mean Plasma Glucose: 114 (calc)

## 2017-02-01 LAB — TSH: TSH: 1.6 mIU/L (ref 0.40–4.50)

## 2017-02-02 ENCOUNTER — Encounter: Payer: Self-pay | Admitting: Family Medicine

## 2017-02-02 ENCOUNTER — Ambulatory Visit (INDEPENDENT_AMBULATORY_CARE_PROVIDER_SITE_OTHER): Payer: Medicare Other | Admitting: Family Medicine

## 2017-02-02 VITALS — BP 151/66 | HR 68 | Ht 68.0 in | Wt 170.0 lb

## 2017-02-02 DIAGNOSIS — Z23 Encounter for immunization: Secondary | ICD-10-CM | POA: Diagnosis not present

## 2017-02-02 DIAGNOSIS — Z0000 Encounter for general adult medical examination without abnormal findings: Secondary | ICD-10-CM

## 2017-02-02 DIAGNOSIS — R030 Elevated blood-pressure reading, without diagnosis of hypertension: Secondary | ICD-10-CM | POA: Diagnosis not present

## 2017-02-02 DIAGNOSIS — E038 Other specified hypothyroidism: Secondary | ICD-10-CM

## 2017-02-02 NOTE — Patient Instructions (Addendum)
Keep up the good exercise!!!  Health Maintenance, Male A healthy lifestyle and preventive care is important for your health and wellness. Ask your health care provider about what schedule of regular examinations is right for you. What should I know about weight and diet? Eat a Healthy Diet  Eat plenty of vegetables, fruits, whole grains, low-fat dairy products, and lean protein.  Do not eat a lot of foods high in solid fats, added sugars, or salt.  Maintain a Healthy Weight Regular exercise can help you achieve or maintain a healthy weight. You should:  Do at least 150 minutes of exercise each week. The exercise should increase your heart rate and make you sweat (moderate-intensity exercise).  Do strength-training exercises at least twice a week.  Watch Your Levels of Cholesterol and Blood Lipids  Have your blood tested for lipids and cholesterol every 5 years starting at 74 years of age. If you are at high risk for heart disease, you should start having your blood tested when you are 74 years old. You may need to have your cholesterol levels checked more often if: ? Your lipid or cholesterol levels are high. ? You are older than 74 years of age. ? You are at high risk for heart disease.  What should I know about cancer screening? Many types of cancers can be detected early and may often be prevented. Lung Cancer  You should be screened every year for lung cancer if: ? You are a current smoker who has smoked for at least 30 years. ? You are a former smoker who has quit within the past 15 years.  Talk to your health care provider about your screening options, when you should start screening, and how often you should be screened.  Colorectal Cancer  Routine colorectal cancer screening usually begins at 74 years of age and should be repeated every 5-10 years until you are 74 years old. You may need to be screened more often if early forms of precancerous polyps or small growths are  found. Your health care provider may recommend screening at an earlier age if you have risk factors for colon cancer.  Your health care provider may recommend using home test kits to check for hidden blood in the stool.  A small camera at the end of a tube can be used to examine your colon (sigmoidoscopy or colonoscopy). This checks for the earliest forms of colorectal cancer.  Prostate and Testicular Cancer  Depending on your age and overall health, your health care provider may do certain tests to screen for prostate and testicular cancer.  Talk to your health care provider about any symptoms or concerns you have about testicular or prostate cancer.  Skin Cancer  Check your skin from head to toe regularly.  Tell your health care provider about any new moles or changes in moles, especially if: ? There is a change in a mole's size, shape, or color. ? You have a mole that is larger than a pencil eraser.  Always use sunscreen. Apply sunscreen liberally and repeat throughout the day.  Protect yourself by wearing long sleeves, pants, a wide-brimmed hat, and sunglasses when outside.  What should I know about heart disease, diabetes, and high blood pressure?  If you are 57-33 years of age, have your blood pressure checked every 3-5 years. If you are 59 years of age or older, have your blood pressure checked every year. You should have your blood pressure measured twice-once when you are at a  hospital or clinic, and once when you are not at a hospital or clinic. Record the average of the two measurements. To check your blood pressure when you are not at a hospital or clinic, you can use: ? An automated blood pressure machine at a pharmacy. ? A home blood pressure monitor.  Talk to your health care provider about your target blood pressure.  If you are between 38-85 years old, ask your health care provider if you should take aspirin to prevent heart disease.  Have regular diabetes  screenings by checking your fasting blood sugar level. ? If you are at a normal weight and have a low risk for diabetes, have this test once every three years after the age of 17. ? If you are overweight and have a high risk for diabetes, consider being tested at a younger age or more often.  A one-time screening for abdominal aortic aneurysm (AAA) by ultrasound is recommended for men aged 56-75 years who are current or former smokers. What should I know about preventing infection? Hepatitis B If you have a higher risk for hepatitis B, you should be screened for this virus. Talk with your health care provider to find out if you are at risk for hepatitis B infection. Hepatitis C Blood testing is recommended for:  Everyone born from [birth year] through 1965.  Anyone with known risk factors for hepatitis C.  Sexually Transmitted Diseases (STDs)  You should be screened each year for STDs including gonorrhea and chlamydia if: ? You are sexually active and are younger than 74 years of age. ? You are older than 74 years of age and your health care provider tells you that you are at risk for this type of infection. ? Your sexual activity has changed since you were last screened and you are at an increased risk for chlamydia or gonorrhea. Ask your health care provider if you are at risk.  Talk with your health care provider about whether you are at high risk of being infected with HIV. Your health care provider may recommend a prescription medicine to help prevent HIV infection.  What else can I do?  Schedule regular health, dental, and eye exams.  Stay current with your vaccines (immunizations).  Do not use any tobacco products, such as cigarettes, chewing tobacco, and e-cigarettes. If you need help quitting, ask your health care provider.  Limit alcohol intake to no more than 2 drinks per day. One drink equals 12 ounces of beer, 5 ounces of wine, or 1 ounces of hard liquor.  Do not use street  drugs.  Do not share needles.  Ask your health care provider for help if you need support or information about quitting drugs.  Tell your health care provider if you often feel depressed.  Tell your health care provider if you have ever been abused or do not feel safe at home. This information is not intended to replace advice given to you by your health care provider. Make sure you discuss any questions you have with your health care provider. Document Released: 10/22/2007 Document Revised: 12/23/2015 Document Reviewed: 01/27/2015 Elsevier Interactive Patient Education  [employer].

## 2017-02-02 NOTE — Progress Notes (Signed)
Subjective:   Zachary Waters is a 76 y.o. male who presents for Medicare Annual/Subsequent preventive examination.  Review of Systems:  Comprehensive ROS is negative.         Objective:    Vitals: BP (!) 157/68   Pulse 68   Ht 5\' 8"  (1.727 m)   Wt 170 lb (77.1 kg)   BMI 25.85 kg/m   Body mass index is 25.85 kg/m.  Tobacco History  Smoking Status  . Former Smoker  . Types: Pipe  . Quit date: 10/07/2009  Smokeless Tobacco  . Never Used     Counseling given: Not Answered   Past Medical History:  Diagnosis Date  . Cervical spondylosis with radiculopathy 03/08/2012  . COPD (chronic obstructive pulmonary disease) ([place])   . Dyspnea   . GERD (gastroesophageal reflux disease) once in a great while  . Pneumonia 10/2009  . Thyroid disease    Past Surgical History:  Procedure Laterality Date  . COLONOSCOPY    . HERNIA REPAIR     inguinal hernia repair on right and left  . INGUINAL HERNIA REPAIR Bilateral 04/21/2016   Procedure: LAPAROSCOPIC LEFT INGUINAL HERNIA REPAIR AND RIGHT FEMORAL HERNIA REPAIR;  Surgeon: Michael Boston, MD;  Location: [facility];  Service: General;  Laterality: Bilateral;  . INSERTION OF MESH Bilateral 04/21/2016   Procedure: INSERTION OF MESH LEFT INGUINAL HERNIA  AND INSERTION OF MESH RIGHT FEMORAL HERNIA;  Surgeon: Michael Boston, MD;  Location: [facility];  Service: General;  Laterality: Bilateral;  . LEG SURGERY Right   . ROTATOR CUFF REPAIR  2005   Left  . TONSILLECTOMY     Family History  Problem Relation Age of Onset  . Prostate cancer Father 45  . Heart disease Mother 62       deceased age 47  . Depression Brother        Commited suicide  . Depression Brother    History  Sexual Activity  . Sexual activity: No    Outpatient Encounter Prescriptions as of 02/02/2017  Medication Sig  . albuterol (PROAIR HFA) 108 (90 BASE) MCG/ACT inhaler Inhale 2 puffs into the lungs every 6 (six) hours as needed.    . budesonide-formoterol (SYMBICORT)  160-4.5 MCG/ACT inhaler Inhale 2 puffs into the lungs 2 (two) times daily.  Marland Kitchen levothyroxine (SYNTHROID, LEVOTHROID) 112 MCG tablet TAKE 1 TABLET (112 MCG TOTAL) BY MOUTH DAILY BEFORE BREAKFAST.  Marland Kitchen Omega-3 Fatty Acids (FISH OIL) 1000 MG CAPS Take 1 capsule by mouth daily.  Marland Kitchen QUERCETIN PO Take 2 tablets by mouth daily. Pt reports taking 1 tablet daily  . tiotropium (SPIRIVA HANDIHALER) 18 MCG inhalation capsule Place 1 capsule (18 mcg total) into inhaler and inhale daily. (Patient taking differently: Place 18 mcg into inhaler and inhale 2 (two) times daily. )  . vitamin C (ASCORBIC ACID) 500 MG tablet Take 500 mg by mouth daily.   No facility-administered encounter medications on file as of 02/02/2017.     Activities of Daily Living In your present state of health, do you have any difficulty performing the following activities: 02/02/2017 04/21/2016  Hearing? Tempie Donning  Vision? N N  Difficulty concentrating or making decisions? N N  Walking or climbing stairs? Y N  Dressing or bathing? N N  Doing errands, shopping? N -  Some recent data might be hidden    Patient Care Team: Hali Marry, MD as PCP - General (Family Medicine) Marcy Panning (Pulmonary Disease) Milas Kocher (Chiropractic Medicine) Michael Boston,  MD as Consulting Physician (General Surgery)   Assessment:     Exercise Activities and Dietary recommendations Current Exercise Habits: Structured exercise class, Type of exercise: treadmill;Other - see comments (golf), Frequency (Times/Week): 5, Intensity: Moderate  Goals    None     Fall Risk Fall Risk  02/02/2017 02/03/2016 01/13/2015 09/25/2014 08/06/2013  Falls in the past year? No No No No No   Depression Screen PHQ 2/9 Scores 02/02/2017 07/26/2016 02/03/2016 01/13/2015  PHQ - 2 Score 0 0 0 0    Cognitive Function     6CIT Screen 02/02/2017 02/03/2016  What Year? 0 points 0 points  What month? 0 points 0 points  What time? 0 points 0 points  Count back from 20 0  points 0 points  Months in reverse 0 points 0 points  Repeat phrase 2 points 2 points  Total Score 2 2    Immunization History  Administered Date(s) Administered  . Influenza, High Dose Seasonal PF 02/02/2017  . Influenza,inj,Quad PF,6+ Mos 02/12/2013, 01/09/2014, 01/13/2015, 02/07/2016  . Pneumococcal Conjugate-13 08/06/2013  . Pneumococcal Polysaccharide-23 05/09/2010  . Tdap 01/03/2012  . Zoster 10/27/2010   Screening Tests Health Maintenance  Topic Date Due  . INFLUENZA VACCINE  12/07/2016  . COLONOSCOPY  06/15/2019  . TETANUS/TDAP  01/02/2022  . PNA vac Low Risk Adult  Completed      Plan:     I have personally reviewed and noted the following in the patient's chart:   . Medical and social history . Use of alcohol, tobacco or illicit drugs  . Current medications and supplements - updated.  . Functional ability and status - normal 6 CIT  . Nutritional status - good . Physical activity - good  . Advanced directives - he  has a living will. Asked that he get [place] a copy of that.  . List of other physicians - Dr. Michela Pitcher. Also seen at the [place].  Marland Kitchen Hospitalizations, surgeries, and ER visits in previous 12 months . Vitals . Screenings to include cognitive, depression, and falls - negative.  . Referrals and appointments . Hypothyroid - plan to  recheck in 3 months.   In addition, I have reviewed and discussed with patient certain preventive protocols, quality metrics, and best practice recommendations. A written personalized care plan for preventive services as well as general preventive health recommendations were provided to patient.     Beatrice Lecher, MD  02/02/2017

## 2017-02-08 ENCOUNTER — Encounter: Payer: Self-pay | Admitting: Family Medicine

## 2017-02-24 ENCOUNTER — Ambulatory Visit: Payer: Self-pay

## 2017-02-24 ENCOUNTER — Encounter: Payer: Self-pay | Admitting: Family Medicine

## 2017-02-24 ENCOUNTER — Ambulatory Visit (INDEPENDENT_AMBULATORY_CARE_PROVIDER_SITE_OTHER): Payer: Medicare Other | Admitting: Family Medicine

## 2017-02-24 VITALS — BP 173/78 | Ht 69.0 in | Wt 168.0 lb

## 2017-02-24 DIAGNOSIS — M25511 Pain in right shoulder: Secondary | ICD-10-CM

## 2017-02-24 MED ORDER — METHYLPREDNISOLONE ACETATE 40 MG/ML IJ SUSP
40.0000 mg | Freq: Once | INTRAMUSCULAR | Status: AC
  Administered 2017-02-24: 40 mg via INTRA_ARTICULAR

## 2017-02-26 DIAGNOSIS — M25511 Pain in right shoulder: Secondary | ICD-10-CM | POA: Insufficient documentation

## 2017-02-26 NOTE — Assessment & Plan Note (Signed)
He may have a component of labral pathology but we started with subacromial bursa injection and he had some pretty immediate pain relief. If not totally resolved in 2 weeks, rtc and would consider GHJ. I suspect the labral issues because of the pain with lowering his arm.

## 2017-02-26 NOTE — Progress Notes (Signed)
  Zachary Waters - 76 y.o. male MRN [id number]  Date of birth: November 14, 1940    SUBJECTIVE:      Chief Complaint:/ HPI:   RIGHT shoulder pain. Started acutely while golfing. Pain with follow through and with impact to golf ball. Pain with lowering arm from raised position. Started 2 days ago. No specific injury that he was aware of but noticed it on 3rd hole of course. Pain does not radiate into posterior shoulder or neck area. Rest makes it a little better but not much. Using OTC aspercreame. Left handed golfer   ROS:     No fever. No weakness or UE. No numbness or tingling UE.  PERTINENT  PMH / PSH FH / / SH:  Past Medical, Surgical, Social, and Family History Reviewed & Updated in the EMR.  Pertinent findings include:  Significant DJD neck w hx cervical radiculopathy right arm COPD  OBJECTIVE: BP (!) 173/78   Ht 5\' 9"  (1.753 m)   Wt 168 lb (76.2 kg)   BMI 24.81 kg/m   Physical Exam:  Vital signs are reviewed. WD WN NAD SHOULDERS symmetrical. RIGHT shoulder has intact strength in all planes of rotator cuff but pain with forward flexion and ABduction. Signific\cant pain with powering arm from overhead position.Nontender to palpation over bicep tendon. NEURO strnegth intact C4-5-6 B= Sensation to soft touch B hands normal grip strength B normal SKIN no lesions or rash over right shoulder area. VASC Radial pulses 2+B=  [place] of RIGHT shoulder. Rotator cuff muscles are intact without defect. Scattered occasional calcification noted in supraspinatus. No impingement on dynamic testing. AC joint reveals some arthropathy. Bicep tendon has some fluid in sheath but normal placement.   PROCEDURE: INJECTION: Patient was given informed consent, signed copy in the chart. Appropriate time out was taken. Area prepped and draped in usual sterile fashion. Ethyl chloride was  used for local anesthesia. A 21 gauge 1 1/2 inch needle was used.. 1 cc of methylprednisolone 40 mg/ml plus  4 cc of 1%  lidocaine without epinephrine was injected into the left subacromial bursa of the shoulder using a(n) posterior approach.   The patient tolerated the procedure well. There were no complications. Post procedure instructions were given.  ASSESSMENT & PLAN:  See problem based charting & AVS for pt instructions.

## 2017-03-07 ENCOUNTER — Ambulatory Visit (INDEPENDENT_AMBULATORY_CARE_PROVIDER_SITE_OTHER): Payer: Medicare Other | Admitting: Family Medicine

## 2017-03-07 ENCOUNTER — Encounter: Payer: Self-pay | Admitting: Family Medicine

## 2017-03-07 VITALS — BP 142/74 | Ht 69.0 in | Wt 168.0 lb

## 2017-03-07 DIAGNOSIS — M25511 Pain in right shoulder: Secondary | ICD-10-CM

## 2017-03-07 MED ORDER — METHYLPREDNISOLONE ACETATE 40 MG/ML IJ SUSP
40.0000 mg | Freq: Once | INTRAMUSCULAR | Status: AC
  Administered 2017-03-07: 40 mg via INTRA_ARTICULAR

## 2017-03-07 NOTE — Patient Instructions (Signed)
Today you received an injection with corticosteroid. This injection is usually done in response to pain and inflammation. There is some "numbing" medicine also in the shot so the injected area may be numb and feel really good for the next couple of hours. The numbing medicine usually wears off in 2-3 hours though, and then your pain level will be right back where it was before the injection.   The actually benefit from the steroid injection is usually noticed in 2-7 days. You may actually experience a small (as in 10%) INCREASE in pain in the first 24 hours---that is common.   Things to watch out for that you should contact us or a health care provider urgently would include: 1. Unusual (as in more than 10%) increase in pain 2. New fever > 101.5 3. New swelling or redness of the injected area.  4. Streaking of red lines around the area injected.  GREAT TO SEE YOU

## 2017-03-07 NOTE — Progress Notes (Signed)
  Zachary Waters - 76 y.o. male MRN [id number]  Date of birth: 11-Nov-1940    SUBJECTIVE:      Chief Complaint:/ HPI:   RIGHT shoulder better after subacromial bursa inject last week. 30 % improvement in pain and use.  Better but not resolved. Golfed yesterday and played 9 holes but still occasional sharp pain with follow through and recovery of swing and some pain with impact of ball.  ROS:     No fever. No upper extremity numbness.  PERTINENT  PMH / PSH FH / / SH:  Past Medical, Surgical, Social, and Family History Reviewed & Updated in the EMR.    OBJECTIVE: BP (!) 142/74   Ht 5\' 9"  (1.753 m)   Wt 168 lb (76.2 kg)   BMI 24.81 kg/m   Physical Exam:  Vital signs are reviewed. WD WN NAD SHOULDER : RIGHT: improved motion on forward felxion and especially on recovery from overhead postion. RC strength intact.  PROCEDURE: INJECTION: Patient was given informed consent, signed copy in the chart. Appropriate time out was taken. Area prepped and draped in usual sterile fashion. Ethyl chloride was  used for local anesthesia. A 21 gauge 1 1/2 inch needle was used.. One cc of methylprednisolone 40 mg/ml plus  4 cc of 1% lidocaine without epinephrine was injected into the right glenohumeral joint space using a(n) ultrasound guided approach.. Additionally, a subacromial bursa injection was performed in like fashion using 1 cc of methylprednisolone and 4 cc of lidocaine    The patient tolerated the procedure well. There were no complications. Post procedure instructions were given. .      ASSESSMENT & PLAN:  See problem based charting & AVS for pt instructions.

## 2017-03-07 NOTE — Assessment & Plan Note (Signed)
2nd CSI in subacromial bursa plus GHJ injection under US guidance. HEP program given. See AVS

## 2017-04-11 DIAGNOSIS — J449 Chronic obstructive pulmonary disease, unspecified: Secondary | ICD-10-CM | POA: Diagnosis not present

## 2017-04-11 DIAGNOSIS — R0609 Other forms of dyspnea: Secondary | ICD-10-CM | POA: Diagnosis not present

## 2017-05-03 DIAGNOSIS — E038 Other specified hypothyroidism: Secondary | ICD-10-CM | POA: Diagnosis not present

## 2017-05-04 LAB — TSH: TSH: 2.01 m[IU]/L (ref 0.40–4.50)

## 2017-05-05 ENCOUNTER — Other Ambulatory Visit: Payer: Self-pay

## 2017-05-05 ENCOUNTER — Encounter: Payer: Self-pay | Admitting: Family Medicine

## 2017-05-05 MED ORDER — LEVOTHYROXINE SODIUM 112 MCG PO TABS: 112.0000 ug | ORAL_TABLET | Freq: Every day | ORAL | 1 refills | Status: DC

## 2017-05-11 ENCOUNTER — Ambulatory Visit (INDEPENDENT_AMBULATORY_CARE_PROVIDER_SITE_OTHER): Payer: Medicare Other | Admitting: Family Medicine

## 2017-05-11 ENCOUNTER — Encounter: Payer: Self-pay | Admitting: Family Medicine

## 2017-05-11 VITALS — BP 160/75 | HR 66 | Ht 69.0 in | Wt 177.0 lb

## 2017-05-11 DIAGNOSIS — M25511 Pain in right shoulder: Secondary | ICD-10-CM | POA: Diagnosis not present

## 2017-05-11 DIAGNOSIS — E038 Other specified hypothyroidism: Secondary | ICD-10-CM | POA: Diagnosis not present

## 2017-05-11 NOTE — Progress Notes (Signed)
Subjective:    Patient ID: Zachary Waters, male    DOB: 08/28/1940, 77 y.o.   MRN: [id number]  HPI 77 year old male comes in today complaining of right shoulder pain.  He says that it started bothering him around the time that I last saw him in September.  And then it suddenly got worse without any specific injury but he was unable to play golf for about a week and a half.  He saw 1 of the sports medicine providers in [place], Dr. Mallie Mussel and received a subacromial bursa injection.  He actually got immediately relief but then 2 days later his pain started to return.  He did not go back to full force he was actually able to return to playing golf but has still continued to hurt and his biggest complaint is that he has had some persistent weakness in that right shoulder.  It is hard to lift things up.  He is even changed his exercise routine.  He is quit doing push-ups and is now just doing planks.  Partly because of weakness he has had to avoid certain exercise machines.  He does not have a similar problem on the left side.  He went back and received a subacromial bursa injection plus G Hg injection.  Again did not experience any significant relief.  At this point he is most interested in physical therapy.  He would like to see L[facility] at [facility] who he used several years ago when he was having neck pain issues.  Does occasionally use Aspercreme on the joint as well and get some temporary relief with that.  He also reports that he had a cough and upper respiratory infection for about a week.  He was taking NyQuil at night.  He said about 2 days ago he actually started feeling much better.  Review of Systems  BP (!) 160/75   Pulse 66   Ht 5\' 9"  (1.753 m)   Wt 177 lb (80.3 kg)   SpO2 98%   BMI 26.14 kg/m     Allergies  Allergen Reactions  . No Known Allergies     Past Medical History:  Diagnosis Date  . Cervical spondylosis with radiculopathy 03/08/2012  .  COPD (chronic obstructive pulmonary disease) ([place])   . Dyspnea   . GERD (gastroesophageal reflux disease) once in a great while  . Pneumonia 10/2009  . Thyroid disease     Past Surgical History:  Procedure Laterality Date  . COLONOSCOPY    . HERNIA REPAIR     inguinal hernia repair on right and left  . INGUINAL HERNIA REPAIR Bilateral 04/21/2016   Procedure: LAPAROSCOPIC LEFT INGUINAL HERNIA REPAIR AND RIGHT FEMORAL HERNIA REPAIR;  Surgeon: Michael Boston, MD;  Location: [facility];  Service: General;  Laterality: Bilateral;  . INSERTION OF MESH Bilateral 04/21/2016   Procedure: INSERTION OF MESH LEFT INGUINAL HERNIA  AND INSERTION OF MESH RIGHT FEMORAL HERNIA;  Surgeon: Michael Boston, MD;  Location: [facility];  Service: General;  Laterality: Bilateral;  . LEG SURGERY Right   . ROTATOR CUFF REPAIR  2005   Left  . TONSILLECTOMY      Social History   Socioeconomic History  . Marital status: Married    Spouse name: Not on file  . Number of children: 2  . Years of education: Not on file  . Highest education level: Not on file  Social Needs  . Financial resource strain: Not on file  .  Food insecurity - worry: Not on file  . Food insecurity - inability: Not on file  . Transportation needs - medical: Not on file  . Transportation needs - non-medical: Not on file  Occupational History  . Occupation: retired  Tobacco Use  . Smoking status: Former Smoker    Types: Pipe    Last attempt to quit: 10/07/2009    Years since quitting: 7.5  . Smokeless tobacco: Never Used  Substance and Sexual Activity  . Alcohol use: Yes    Comment: occasional  . Drug use: No  . Sexual activity: No  Other Topics Concern  . Not on file  Social History Narrative   Power walks 3 days per week for 2 miles. Golfs twice a week.     Family History  Problem Relation Age of Onset  . Prostate cancer Father 24  . Heart disease Mother 5       deceased age 53  . Depression Brother        Commited suicide  .  Depression Brother     Outpatient Encounter Medications as of 05/11/2017  Medication Sig  . albuterol (PROAIR HFA) 108 (90 BASE) MCG/ACT inhaler Inhale 2 puffs into the lungs every 6 (six) hours as needed.    . budesonide-formoterol (SYMBICORT) 160-4.5 MCG/ACT inhaler Inhale 2 puffs into the lungs 2 (two) times daily.  Marland Kitchen levothyroxine (SYNTHROID, LEVOTHROID) 112 MCG tablet Take 1 tablet (112 mcg total) by mouth daily before breakfast.  . Omega-3 Fatty Acids (FISH OIL) 1000 MG CAPS Take 1 capsule by mouth daily.  Marland Kitchen QUERCETIN PO Take 2 tablets by mouth daily. Pt reports taking 1 tablet daily  . tiotropium (SPIRIVA HANDIHALER) 18 MCG inhalation capsule Place 1 capsule (18 mcg total) into inhaler and inhale daily. (Patient taking differently: Place 18 mcg into inhaler and inhale 2 (two) times daily. )  . vitamin C (ASCORBIC ACID) 500 MG tablet Take 500 mg by mouth daily.   No facility-administered encounter medications on file as of 05/11/2017.          Objective:   Physical Exam  Constitutional: He is oriented to person, place, and time. He appears well-developed and well-nourished.  HENT:  Head: Normocephalic and atraumatic.  Right Ear: External ear normal.  Left Ear: External ear normal.  Nose: Nose normal.  Mouth/Throat: Oropharynx is clear and moist.  TMs and canals are clear.   Eyes: Conjunctivae and EOM are normal. Pupils are equal, round, and reactive to light.  Neck: Neck supple. No thyromegaly present.  Cardiovascular: Normal rate and normal heart sounds.  Pulmonary/Chest: Effort normal and breath sounds normal.  Lymphadenopathy:    He has no cervical adenopathy.  Neurological: He is alert and oriented to person, place, and time.  Skin: Skin is warm and dry.  Psychiatric: He has a normal mood and affect.          Assessment & Plan:  Right shoulder pain-persistent after 3 steroid injections.  At this point I do think he would be a great candidate for physical therapy  particularly because he does have some significant weakness with extension.  Referral placed.  If not improving significantly with physical therapy then consider MRI for further evaluation.  Okay to continue conservative therapy.  Upper respiratory infection-resolved.  Lungs are clear on exam today.

## 2017-05-23 DIAGNOSIS — M25511 Pain in right shoulder: Secondary | ICD-10-CM | POA: Diagnosis not present

## 2017-05-23 DIAGNOSIS — M6281 Muscle weakness (generalized): Secondary | ICD-10-CM | POA: Diagnosis not present

## 2017-05-30 DIAGNOSIS — M6281 Muscle weakness (generalized): Secondary | ICD-10-CM | POA: Diagnosis not present

## 2017-05-30 DIAGNOSIS — M25511 Pain in right shoulder: Secondary | ICD-10-CM | POA: Diagnosis not present

## 2017-06-01 DIAGNOSIS — M6281 Muscle weakness (generalized): Secondary | ICD-10-CM | POA: Diagnosis not present

## 2017-06-01 DIAGNOSIS — M25511 Pain in right shoulder: Secondary | ICD-10-CM | POA: Diagnosis not present

## 2017-06-06 DIAGNOSIS — M25511 Pain in right shoulder: Secondary | ICD-10-CM | POA: Diagnosis not present

## 2017-06-08 DIAGNOSIS — M25511 Pain in right shoulder: Secondary | ICD-10-CM | POA: Diagnosis not present

## 2017-06-08 DIAGNOSIS — M6281 Muscle weakness (generalized): Secondary | ICD-10-CM | POA: Diagnosis not present

## 2017-06-13 DIAGNOSIS — M25511 Pain in right shoulder: Secondary | ICD-10-CM | POA: Diagnosis not present

## 2017-06-13 DIAGNOSIS — M6281 Muscle weakness (generalized): Secondary | ICD-10-CM | POA: Diagnosis not present

## 2017-06-15 DIAGNOSIS — M6281 Muscle weakness (generalized): Secondary | ICD-10-CM | POA: Diagnosis not present

## 2017-06-15 DIAGNOSIS — M25511 Pain in right shoulder: Secondary | ICD-10-CM | POA: Diagnosis not present

## 2017-06-20 DIAGNOSIS — M6281 Muscle weakness (generalized): Secondary | ICD-10-CM | POA: Diagnosis not present

## 2017-06-20 DIAGNOSIS — M25511 Pain in right shoulder: Secondary | ICD-10-CM | POA: Diagnosis not present

## 2017-06-23 DIAGNOSIS — M25511 Pain in right shoulder: Secondary | ICD-10-CM | POA: Diagnosis not present

## 2017-06-23 DIAGNOSIS — M6281 Muscle weakness (generalized): Secondary | ICD-10-CM | POA: Diagnosis not present

## 2017-06-27 DIAGNOSIS — M6281 Muscle weakness (generalized): Secondary | ICD-10-CM | POA: Diagnosis not present

## 2017-06-27 DIAGNOSIS — M25511 Pain in right shoulder: Secondary | ICD-10-CM | POA: Diagnosis not present

## 2017-06-29 DIAGNOSIS — M25511 Pain in right shoulder: Secondary | ICD-10-CM | POA: Diagnosis not present

## 2017-06-29 DIAGNOSIS — M6281 Muscle weakness (generalized): Secondary | ICD-10-CM | POA: Diagnosis not present

## 2017-07-11 DIAGNOSIS — M6281 Muscle weakness (generalized): Secondary | ICD-10-CM | POA: Diagnosis not present

## 2017-07-11 DIAGNOSIS — M25511 Pain in right shoulder: Secondary | ICD-10-CM | POA: Diagnosis not present

## 2017-07-13 DIAGNOSIS — M25511 Pain in right shoulder: Secondary | ICD-10-CM | POA: Diagnosis not present

## 2017-07-13 DIAGNOSIS — M6281 Muscle weakness (generalized): Secondary | ICD-10-CM | POA: Diagnosis not present

## 2017-07-18 DIAGNOSIS — M25511 Pain in right shoulder: Secondary | ICD-10-CM | POA: Diagnosis not present

## 2017-07-18 DIAGNOSIS — M6281 Muscle weakness (generalized): Secondary | ICD-10-CM | POA: Diagnosis not present

## 2017-07-20 DIAGNOSIS — M6281 Muscle weakness (generalized): Secondary | ICD-10-CM | POA: Diagnosis not present

## 2017-07-20 DIAGNOSIS — M25511 Pain in right shoulder: Secondary | ICD-10-CM | POA: Diagnosis not present

## 2017-07-27 DIAGNOSIS — M25511 Pain in right shoulder: Secondary | ICD-10-CM | POA: Diagnosis not present

## 2017-07-27 DIAGNOSIS — M6281 Muscle weakness (generalized): Secondary | ICD-10-CM | POA: Diagnosis not present

## 2017-08-03 DIAGNOSIS — M6281 Muscle weakness (generalized): Secondary | ICD-10-CM | POA: Diagnosis not present

## 2017-08-03 DIAGNOSIS — M25511 Pain in right shoulder: Secondary | ICD-10-CM | POA: Diagnosis not present

## 2017-08-09 ENCOUNTER — Encounter: Payer: Self-pay | Admitting: Family Medicine

## 2017-08-10 ENCOUNTER — Encounter: Payer: Self-pay | Admitting: Family Medicine

## 2017-08-10 ENCOUNTER — Ambulatory Visit (INDEPENDENT_AMBULATORY_CARE_PROVIDER_SITE_OTHER): Payer: Medicare Other | Admitting: Family Medicine

## 2017-08-10 VITALS — BP 140/78 | HR 64 | Ht 69.0 in | Wt 178.0 lb

## 2017-08-10 DIAGNOSIS — J019 Acute sinusitis, unspecified: Secondary | ICD-10-CM | POA: Diagnosis not present

## 2017-08-10 DIAGNOSIS — J441 Chronic obstructive pulmonary disease with (acute) exacerbation: Secondary | ICD-10-CM

## 2017-08-10 MED ORDER — PREDNISONE 20 MG PO TABS: 40.0000 mg | ORAL_TABLET | Freq: Every day | ORAL | 0 refills | Status: DC

## 2017-08-10 MED ORDER — AMOXICILLIN-POT CLAVULANATE 875-125 MG PO TABS: 1.0000 | ORAL_TABLET | Freq: Two times a day (BID) | ORAL | 0 refills | Status: DC

## 2017-08-10 NOTE — Progress Notes (Signed)
   Subjective:    Patient ID: Zachary Waters, male    DOB: [birth year], 77 y.o.   MRN: [id number]  HPI  77 year old male comes in today with concerns about his sinuses.  He said some increase in mucus and nasal congestion with some yellow drainage times 3 weeks.  It is not severe but it really just has not gotten a lot better.  He is noticing a little extra noise and heavy breathing in his chest at night.  He is also noticed that his exercise capacity is down a little bit at the gym he works out very regularly..  No fevers chills or sweats.  No facial pain or pressure.  He also complains of a cough that seems to be worse at night.  He is been mostly using NyQuil which does seem to help.  He says his wife encouraged him come today. Non increase in SOB.    Review of Systems     Objective:   Physical Exam  Constitutional: He is oriented to person, place, and time. He appears well-developed and well-nourished.  HENT:  Head: Normocephalic and atraumatic.  Right Ear: External ear normal.  Left Ear: External ear normal.  Nose: Nose normal.  Mouth/Throat: Oropharynx is clear and moist.  TMs and canals are clear.   Eyes: Pupils are equal, round, and reactive to light. Conjunctivae and EOM are normal.  Neck: Neck supple. No thyromegaly present.  Cardiovascular: Normal rate and normal heart sounds.  Pulmonary/Chest: Effort normal and breath sounds normal.  Lymphadenopathy:    He has no cervical adenopathy.  Neurological: He is alert and oriented to person, place, and time.  Skin: Skin is warm and dry.  Psychiatric: He has a normal mood and affect.          Assessment & Plan:  Acute sinusitis with COPD exacerbation-we will treat with Augmentin and prednisone.  The NyQuil seems to help control his cough at night so he can continue to use that as needed.  If not improving after the weekend then please let us know.  Also please be liberal with albuterol as needed.  Continue regular inhaler  use.

## 2017-08-15 ENCOUNTER — Encounter: Payer: Self-pay | Admitting: Family Medicine

## 2017-10-17 DIAGNOSIS — J449 Chronic obstructive pulmonary disease, unspecified: Secondary | ICD-10-CM | POA: Diagnosis not present

## 2017-10-17 DIAGNOSIS — Z87891 Personal history of nicotine dependence: Secondary | ICD-10-CM | POA: Diagnosis not present

## 2017-10-17 DIAGNOSIS — R0609 Other forms of dyspnea: Secondary | ICD-10-CM | POA: Diagnosis not present

## 2017-10-18 ENCOUNTER — Other Ambulatory Visit: Payer: Self-pay | Admitting: Family Medicine

## 2017-10-22 ENCOUNTER — Encounter: Payer: Self-pay | Admitting: Family Medicine

## 2017-10-22 DIAGNOSIS — E039 Hypothyroidism, unspecified: Secondary | ICD-10-CM

## 2017-10-24 DIAGNOSIS — Z87891 Personal history of nicotine dependence: Secondary | ICD-10-CM | POA: Diagnosis not present

## 2017-10-25 DIAGNOSIS — E039 Hypothyroidism, unspecified: Secondary | ICD-10-CM | POA: Diagnosis not present

## 2017-10-26 LAB — TSH: TSH: 1.43 mIU/L (ref 0.40–4.50)

## 2017-11-30 ENCOUNTER — Ambulatory Visit (INDEPENDENT_AMBULATORY_CARE_PROVIDER_SITE_OTHER): Payer: Medicare Other | Admitting: Family Medicine

## 2017-11-30 ENCOUNTER — Encounter: Payer: Self-pay | Admitting: Family Medicine

## 2017-11-30 VITALS — BP 136/76 | HR 67 | Ht 69.0 in | Wt 174.0 lb

## 2017-11-30 DIAGNOSIS — E038 Other specified hypothyroidism: Secondary | ICD-10-CM | POA: Diagnosis not present

## 2017-11-30 DIAGNOSIS — R7301 Impaired fasting glucose: Secondary | ICD-10-CM | POA: Diagnosis not present

## 2017-11-30 DIAGNOSIS — Z125 Encounter for screening for malignant neoplasm of prostate: Secondary | ICD-10-CM | POA: Diagnosis not present

## 2017-11-30 DIAGNOSIS — J432 Centrilobular emphysema: Secondary | ICD-10-CM | POA: Diagnosis not present

## 2017-11-30 NOTE — Progress Notes (Signed)
Subjective:    Patient ID: Zachary Waters, male    DOB: Jul 29, 1940, 77 y.o.   MRN: [id number]  HPI Hypothyroidism - Taking medication regularly in the AM away from food and vitamins, etc. No recent change to skin, hair, or energy levels.  Impaired fasting glucose-no increased thirst or urination. No symptoms consistent with hypoglycemia.  COPD-he did follow-up with his pulmonologist Dr. Michela Pitcher in June.  They are doing a CT lung screening we will call him with results.  Not change any of his medications.  He is stable and doing well.  He just confirmed moderate centrilobular emphysema.  Even though his lung tests have not really changed significantly in the last several years he just notices that he physically feels more short of breath.  He notices it with exercise, with golfing and even just walking on the beach.  He also notices that he has uses rescue inhaler a little bit more frequently.  Review of Systems  BP 136/76   Pulse 67   Ht 5\' 9"  (1.753 m)   Wt 174 lb (78.9 kg)   SpO2 98%   BMI 25.70 kg/m     Allergies  Allergen Reactions  . No Known Allergies     Past Medical History:  Diagnosis Date  . Cervical spondylosis with radiculopathy 03/08/2012  . COPD (chronic obstructive pulmonary disease) ([place])   . Dyspnea   . GERD (gastroesophageal reflux disease) once in a great while  . Pneumonia 10/2009  . Thyroid disease     Past Surgical History:  Procedure Laterality Date  . COLONOSCOPY    . HERNIA REPAIR     inguinal hernia repair on right and left  . INGUINAL HERNIA REPAIR Bilateral 04/21/2016   Procedure: LAPAROSCOPIC LEFT INGUINAL HERNIA REPAIR AND RIGHT FEMORAL HERNIA REPAIR;  Surgeon: Michael Boston, MD;  Location: [place];  Service: General;  Laterality: Bilateral;  . INSERTION OF MESH Bilateral 04/21/2016   Procedure: INSERTION OF MESH LEFT INGUINAL HERNIA  AND INSERTION OF MESH RIGHT FEMORAL HERNIA;  Surgeon: Michael Boston, MD;  Location: [place];  Service:  General;  Laterality: Bilateral;  . LEG SURGERY Right   . ROTATOR CUFF REPAIR  2005   Left  . TONSILLECTOMY      Social History   Socioeconomic History  . Marital status: Married    Spouse name: Not on file  . Number of children: 2  . Years of education: Not on file  . Highest education level: Not on file  Occupational History  . Occupation: retired  Scientific laboratory technician  . Financial resource strain: Not on file  . Food insecurity:    Worry: Not on file    Inability: Not on file  . Transportation needs:    Medical: Not on file    Non-medical: Not on file  Tobacco Use  . Smoking status: Former Smoker    Types: Pipe    Last attempt to quit: 10/07/2009    Years since quitting: 8.1  . Smokeless tobacco: Never Used  Substance and Sexual Activity  . Alcohol use: Yes    Comment: occasional  . Drug use: No  . Sexual activity: Never  Lifestyle  . Physical activity:    Days per week: Not on file    Minutes per session: Not on file  . Stress: Not on file  Relationships  . Social connections:    Talks on phone: Not on file    Gets together: Not on file  Attends religious service: Not on file    Active member of club or organization: Not on file    Attends meetings of clubs or organizations: Not on file    Relationship status: Not on file  . Intimate partner violence:    Fear of current or ex partner: Not on file    Emotionally abused: Not on file    Physically abused: Not on file    Forced sexual activity: Not on file  Other Topics Concern  . Not on file  Social History Narrative   Power walks 3 days per week for 2 miles. Golfs twice a week.     Family History  Problem Relation Age of Onset  . Prostate cancer Father 50  . Heart disease Mother 67       deceased age 88  . Depression Brother        Commited suicide  . Depression Brother     Outpatient Encounter Medications as of 11/30/2017  Medication Sig  . albuterol (PROAIR HFA) 108 (90 BASE) MCG/ACT inhaler Inhale 2  puffs into the lungs every 6 (six) hours as needed.    . budesonide-formoterol (SYMBICORT) 160-4.5 MCG/ACT inhaler Inhale 2 puffs into the lungs 2 (two) times daily.  Marland Kitchen levothyroxine (SYNTHROID, LEVOTHROID) 112 MCG tablet TAKE 1 TABLET BY MOUTH DAILY BEFORE BREAKFAST.  Marland Kitchen Omega-3 Fatty Acids (FISH OIL) 1000 MG CAPS Take 1 capsule by mouth daily.  Marland Kitchen QUERCETIN PO Take 2 tablets by mouth daily. Pt reports taking 1 tablet daily  . tiotropium (SPIRIVA HANDIHALER) 18 MCG inhalation capsule Place 1 capsule (18 mcg total) into inhaler and inhale daily. (Patient taking differently: Place 18 mcg into inhaler and inhale 2 (two) times daily. )  . [DISCONTINUED] amoxicillin-clavulanate (AUGMENTIN) 875-125 MG tablet Take 1 tablet by mouth 2 (two) times daily.  . [DISCONTINUED] predniSONE (DELTASONE) 20 MG tablet Take 2 tablets (40 mg total) by mouth daily with breakfast.  . [DISCONTINUED] vitamin C (ASCORBIC ACID) 500 MG tablet Take 500 mg by mouth daily.   No facility-administered encounter medications on file as of 11/30/2017.         Objective:   Physical Exam  Constitutional: He is oriented to person, place, and time. He appears well-developed and well-nourished.  HENT:  Head: Normocephalic and atraumatic.  Cardiovascular: Normal rate, regular rhythm and normal heart sounds.  Pulmonary/Chest: Effort normal and breath sounds normal.  Neurological: He is alert and oriented to person, place, and time.  Skin: Skin is warm and dry.  Psychiatric: He has a normal mood and affect. His behavior is normal.        Assessment & Plan:  Hypothyroidism - Well controlled. Continue current regimen. Follow up in  6 months.   IFG - Well controlled. Continue current regimen. Follow up in  6 months.    COPD-overall stable though progressively he feels like his symptoms have gotten a little worse over the last year or so.  No changes to his regimen.  Currently on Symbicort and Spiriva.

## 2018-01-30 DIAGNOSIS — R7301 Impaired fasting glucose: Secondary | ICD-10-CM | POA: Diagnosis not present

## 2018-01-30 DIAGNOSIS — E785 Hyperlipidemia, unspecified: Secondary | ICD-10-CM | POA: Diagnosis not present

## 2018-01-31 LAB — LIPID PANEL
Cholesterol: 191 mg/dL (ref <200)
HDL: 62 mg/dL (ref >40)
LDL Cholesterol (Calc): 115 mg/dL (calc) — ABNORMAL HIGH
NON-HDL CHOLESTEROL (CALC): 129 mg/dL (ref <130)
TRIGLYCERIDES: 46 mg/dL (ref <150)
Total CHOL/HDL Ratio: 3.1 (calc) (ref <5.0)

## 2018-01-31 LAB — HEMOGLOBIN A1C
EAG (MMOL/L): 6.5 (calc)
Hgb A1c MFr Bld: 5.7 % of total Hgb — ABNORMAL HIGH (ref <5.7)
MEAN PLASMA GLUCOSE: 117 (calc)

## 2018-01-31 LAB — COMPLETE METABOLIC PANEL WITH GFR
AG RATIO: 2.1 (calc) (ref 1.0–2.5)
ALKALINE PHOSPHATASE (APISO): 49 U/L (ref 40–115)
ALT: 13 U/L (ref 9–46)
AST: 18 U/L (ref 10–35)
Albumin: 4.1 g/dL (ref 3.6–5.1)
BILIRUBIN TOTAL: 0.3 mg/dL (ref 0.2–1.2)
BUN: 18 mg/dL (ref 7–25)
CALCIUM: 9.2 mg/dL (ref 8.6–10.3)
CHLORIDE: 107 mmol/L (ref 98–110)
CO2: 27 mmol/L (ref 20–32)
Creat: 1.04 mg/dL (ref 0.70–1.18)
GFR, EST NON AFRICAN AMERICAN: 70 mL/min/{1.73_m2} (ref >=60)
GFR, Est African American: 82 mL/min/{1.73_m2} (ref >=60)
GLOBULIN: 2 g/dL (ref 1.9–3.7)
Glucose, Bld: 100 mg/dL — ABNORMAL HIGH (ref 65–99)
POTASSIUM: 4.5 mmol/L (ref 3.5–5.3)
SODIUM: 142 mmol/L (ref 135–146)
Total Protein: 6.1 g/dL (ref 6.1–8.1)

## 2018-01-31 LAB — PSA: PSA: 1 ng/mL (ref <=4.0)

## 2018-02-01 ENCOUNTER — Other Ambulatory Visit: Payer: Self-pay

## 2018-02-01 ENCOUNTER — Encounter: Payer: Self-pay | Admitting: Emergency Medicine

## 2018-02-01 ENCOUNTER — Emergency Department (INDEPENDENT_AMBULATORY_CARE_PROVIDER_SITE_OTHER)
Admission: EM | Admit: 2018-02-01 | Discharge: 2018-02-01 | Disposition: A | Discharge status: Home / Self Care | Payer: Medicare Other | Source: Home / Self Care | Attending: Family Medicine | Admitting: Family Medicine

## 2018-02-01 DIAGNOSIS — R410 Disorientation, unspecified: Secondary | ICD-10-CM

## 2018-02-01 NOTE — Discharge Instructions (Signed)
°  Your neurologic exam was normal this evening in urgent care. You blood pressure was a little high.   Please follow up with Dr. Madilyn Fireman tomorrow at [facility].  If symptoms return- confusion, loss of words, slurred speech, altered gait, change in vision, headache, one sided weakness/numbness, facial droop, please call 911 or go to hospital immediately for further evaluation and treatment.

## 2018-02-01 NOTE — ED Provider Notes (Signed)
[facility]    CSN: [id number] Arrival date & time: 02/01/18  1914     History   Chief Complaint Chief Complaint  Patient presents with  . Forgetfullness    HPI Zachary Waters is a 77 y.o. male.   HPI  Zachary Waters is a 77 y.o. male presenting to UC with wife with concern for episode of forgetfulness just PTA. Pt was sitting in his car at [facility] waiting for his wife to get a prescription. While in the car, he attempted to text his daughter but became confused about what he was going to text her and felt like he could not find the words he needed to say. When his wife got back into the car she notes she also seemed mildly confused and seemed to be grasping for the correct words to say.  Wife advised him they needed to go to the hospital or urgent care. Pt chose to drive himself to [facility]. Pt states he feels fine now and is very active and relatively healthy besides hx of COPD. He played golf today and took a nap. Denies HA, dizziness, recent illness, n/v/d. No weakness or numbness. No change in vision. No hx of migraines or hx of similar episodes of forgetfulness. Pt does report family hx of dementia. His younger sister, who is in her 1s, has Alzheimer's and his mother had Alzheimer's as well.   BP elevated in triage. Pt denies hx of HTN but states he has "Alldredge coat syndrome"   Past Medical History:  Diagnosis Date  . Cervical spondylosis with radiculopathy 03/08/2012  . COPD (chronic obstructive pulmonary disease) ([place])   . Dyspnea   . GERD (gastroesophageal reflux disease) once in a great while  . Pneumonia 10/2009  . Thyroid disease     Patient Active Problem List   Diagnosis Date Noted  . Pain in joint of right shoulder 02/26/2017  . Coronary artery calcification seen on CAT scan 11/01/2016  . Recurrent left inguinal hernia s/p lap repair with mesh 04/21/2016 04/21/2016  . Femoral hernia of right side s/p lap repair with mesh 04/21/2016 04/21/2016  .  IFG (impaired fasting glucose) 01/08/2016  . Milk intolerance 09/11/2015  . Thyroid activity decreased 09/11/2015  . Centrilobular emphysema ([place]) 09/11/2015  . Elevated BP without diagnosis of hypertension 07/30/2015  . Cervical spondylosis with radiculopathy 03/08/2012  . Left shoulder, arm, and hand pain 01/06/2012  . Hypothyroid 10/20/2010  . Chronic obstructive pulmonary disease ([place]) 10/20/2010    Past Surgical History:  Procedure Laterality Date  . COLONOSCOPY    . HERNIA REPAIR     inguinal hernia repair on right and left  . INGUINAL HERNIA REPAIR Bilateral 04/21/2016   Procedure: LAPAROSCOPIC LEFT INGUINAL HERNIA REPAIR AND RIGHT FEMORAL HERNIA REPAIR;  Surgeon: Michael Boston, MD;  Location: [place];  Service: General;  Laterality: Bilateral;  . INSERTION OF MESH Bilateral 04/21/2016   Procedure: INSERTION OF MESH LEFT INGUINAL HERNIA  AND INSERTION OF MESH RIGHT FEMORAL HERNIA;  Surgeon: Michael Boston, MD;  Location: [place];  Service: General;  Laterality: Bilateral;  . LEG SURGERY Right   . ROTATOR CUFF REPAIR  2005   Left  . TONSILLECTOMY         Home Medications    Prior to Admission medications   Medication Sig Start Date End Date Taking? Authorizing Provider  albuterol (PROAIR HFA) 108 (90 BASE) MCG/ACT inhaler Inhale 2 puffs into the lungs every 6 (six) hours as needed.  [provider]  budesonide-formoterol (SYMBICORT) 160-4.5 MCG/ACT inhaler Inhale 2 puffs into the lungs 2 (two) times daily.    [provider]  levothyroxine (SYNTHROID, LEVOTHROID) 112 MCG tablet TAKE 1 TABLET BY MOUTH DAILY BEFORE BREAKFAST. 10/18/17   Hali Marry, MD  Omega-3 Fatty Acids (FISH OIL) 1000 MG CAPS Take 1 capsule by mouth daily.    [provider]  QUERCETIN PO Take 2 tablets by mouth daily. Pt reports taking 1 tablet daily    [provider]  tiotropium (SPIRIVA HANDIHALER) 18 MCG inhalation capsule Place 1 capsule (18 mcg total) into  inhaler and inhale daily. Patient taking differently: Place 18 mcg into inhaler and inhale 2 (two) times daily.  01/10/14   Hali Marry, MD    Family History Family History  Problem Relation Age of Onset  . Prostate cancer Father 61  . Heart disease Mother 45       deceased age 48  . Depression Brother        Commited suicide  . Depression Brother     Social History Social History   Tobacco Use  . Smoking status: Former Smoker    Types: Pipe    Last attempt to quit: 10/07/2009    Years since quitting: 8.3  . Smokeless tobacco: Never Used  Substance Use Topics  . Alcohol use: Yes    Comment: occasional  . Drug use: No     Allergies   No known allergies   Review of Systems Review of Systems  Constitutional: Negative for chills and fever.  HENT: Negative for congestion, ear pain, sore throat, trouble swallowing and voice change.   Respiratory: Negative for cough and shortness of breath.   Cardiovascular: Negative for chest pain and palpitations.  Gastrointestinal: Negative for abdominal pain, diarrhea, nausea and vomiting.  Musculoskeletal: Negative for arthralgias, back pain and myalgias.  Skin: Negative for rash.  Neurological: Positive for speech difficulty. Negative for dizziness, facial asymmetry, light-headedness, numbness and headaches.  Psychiatric/Behavioral: Positive for confusion. Negative for hallucinations. The patient is not nervous/anxious.      Physical Exam Triage Vital Signs ED Triage Vitals  Enc Vitals Group     BP 02/01/18 1923 (!) 189/80     Pulse Rate 02/01/18 1923 72     Resp --      Temp 02/01/18 1923 (!) 97.5 F (36.4 C)     Temp Source 02/01/18 1923 Oral     SpO2 02/01/18 1923 95 %     Weight 02/01/18 1924 170 lb (77.1 kg)     Height 02/01/18 1924 5\' 9"  (1.753 m)     Head Circumference --      Peak Flow --      Pain Score 02/01/18 1924 0     Pain Loc --      Pain Edu? --      Excl. in [place]? --    No data found.  Updated  Vital Signs BP (!) 174/79 (BP Location: Right Arm)   Pulse 72   Temp (!) 97.5 F (36.4 C) (Oral)   Ht 5\' 9"  (1.753 m)   Wt 170 lb (77.1 kg)   SpO2 95%   BMI 25.10 kg/m   Visual Acuity Right Eye Distance:   Left Eye Distance:   Bilateral Distance:    Right Eye Near:   Left Eye Near:    Bilateral Near:     Physical Exam  Constitutional: He is oriented to person, place, and time.  He appears well-developed and well-nourished. No distress.  Healthy appearing elderly male sitting in exam chair, NAD. Alert and cooperative during exam.  HENT:  Head: Normocephalic and atraumatic.  Right Ear: Tympanic membrane normal.  Left Ear: Tympanic membrane normal.  Nose: Nose normal.  Mouth/Throat: Uvula is midline, oropharynx is clear and moist and mucous membranes are normal.  Eyes: Pupils are equal, round, and reactive to light. EOM are normal. Right eye exhibits no discharge. Left eye exhibits no discharge.  Neck: Normal range of motion. Neck supple.  Cardiovascular: Normal rate and regular rhythm.  Pulmonary/Chest: Effort normal and breath sounds normal. No stridor. No respiratory distress. He has no wheezes. He has no rales.  Musculoskeletal: Normal range of motion.  Neurological: He is alert and oriented to person, place, and time. No cranial nerve deficit. He displays a negative Romberg sign.  CN II-XII in tact. Speech is clear, alert to person, place, time and event.  Able to follow 2 step commands. Normal finger to nose coordination. Normal gait and heel-to-toe walk.   Skin: Skin is warm and dry. He is not diaphoretic.  Psychiatric: He has a normal mood and affect. His behavior is normal.  Nursing note and vitals reviewed.    UC Treatments / Results  Labs (all labs ordered are listed, but only abnormal results are displayed) Labs Reviewed - No data to display  EKG None  Radiology No results found.  Procedures Procedures (including critical care time)  Medications Ordered  in UC Medications - No data to display  Initial Impression / Assessment and Plan / UC Course  I have reviewed the triage vital signs and the nursing notes.  Pertinent labs & imaging results that were available during my care of the patient were reviewed by me and considered in my medical decision making (see chart for details).     Pt and wife report new sudden onset episode of confusion that lasted less than 5 minutes.  Normal neuro exam. Consulted with pt's PCP, Dr. Madilyn Fireman.  Pt is safe for discharge home this evening to f/u in the morning at 8AM in her office. Discussed symptoms that warrant emergent care in the ED to pt and his wife. Pt and wife verbalized understanding and agreement with tx plan.  Pt info packet provided.  Final Clinical Impressions(s) / UC Diagnoses   Final diagnoses:  Episode of confusion     Discharge Instructions      Your neurologic exam was normal this evening in urgent care. You blood pressure was a little high.   Please follow up with Dr. Madilyn Fireman tomorrow at [facility].  If symptoms return- confusion, loss of words, slurred speech, altered gait, change in vision, headache, one sided weakness/numbness, facial droop, please call 911 or go to hospital immediately for further evaluation and treatment.     ED Prescriptions    None     Controlled Substance Prescriptions [place] Controlled Substance Registry consulted? Not Applicable   Tyrell Antonio 02/01/18 2002

## 2018-02-01 NOTE — ED Triage Notes (Addendum)
Patient was texting daughter and forgot what what he was saying, how to complete a sentence, feels weird. [facility], was not with him, he told her when she came out, she said he wasn't making complete sentences, he drove here.  He states he feels fine, shows no signs of stroke at this point. He was able to tell me his DOB, HT, Wt, Chief Complaint,

## 2018-02-02 ENCOUNTER — Encounter: Payer: Self-pay | Admitting: Family Medicine

## 2018-02-02 ENCOUNTER — Ambulatory Visit (HOSPITAL_COMMUNITY)
Admission: RE | Admit: 2018-02-02 | Discharge: 2018-02-02 | Disposition: A | Discharge status: Home / Self Care | Payer: Medicare Other | Source: Ambulatory Visit | Attending: Family Medicine | Admitting: Family Medicine

## 2018-02-02 ENCOUNTER — Ambulatory Visit (INDEPENDENT_AMBULATORY_CARE_PROVIDER_SITE_OTHER): Payer: Medicare Other | Admitting: Family Medicine

## 2018-02-02 VITALS — BP 140/68 | HR 63 | Wt 173.0 lb

## 2018-02-02 DIAGNOSIS — G319 Degenerative disease of nervous system, unspecified: Secondary | ICD-10-CM | POA: Insufficient documentation

## 2018-02-02 DIAGNOSIS — R410 Disorientation, unspecified: Secondary | ICD-10-CM | POA: Diagnosis not present

## 2018-02-02 DIAGNOSIS — R404 Transient alteration of awareness: Secondary | ICD-10-CM

## 2018-02-02 DIAGNOSIS — E785 Hyperlipidemia, unspecified: Secondary | ICD-10-CM

## 2018-02-02 HISTORY — DX: Hyperlipidemia, unspecified: E78.5

## 2018-02-02 NOTE — Progress Notes (Signed)
Subjective:    Patient ID: Zachary Waters, male    DOB: 1940/10/31, 77 y.o.   MRN: [id number]  HPI 77 year old male w/ hx of CAD and COPDcomes in today for follow-up of urgent care visit yesterday.  He was sitting in his car at [facility] waiting for his wife to pick up a prescription when he was attempting to text his daughter and suddenly became confused.  He felt like he could not figure out the words to say.  By the time his wife got back in the car he just seemed mildly confused and was grasping to use the proper words.  At that point they decided to go to urgent care.  He actually drove there himself.  By the time he arrived he was feeling perfectly fine.  He is an otherwise very healthy active 77 year old male with COPD.  He had actually played golf earlier in the day.  When he dropped his neighbor off he was having a discussion with his neighbor's wife and remembers having a little bit of difficulty with some word finding at that point.  But says at the time just kind of brushed it off..  At the time of the visit yesterday he denied any headache, dizziness or chest pain.  He had no recent illnesses.  No change in vision.  Does have a family history of dementia and in fact has a younger sister in her 55s who has pretty advanced dementia and Alzheimer's.  His mother also had Alzheimer's.  He thinks the episode in the car where he had difficulty texting and communicating with his wife probably lasted about a total of 10 minutes.  His wife says that he was actually slurring his words at the time.  He used to take aspirin daily but has not been for quite some time.  He does usually take a couple of Aleve after golfing.  He also reports that he actually had a TIA in his 51s.  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  Elevated blood pressures-his blood pressure was quite elevated at the urgent care yesterday though it did come down some before he was discharged home.  Later in the evening when he got home he checked it again himself and it was 132/79.  This morning he checked it after he got up and it was elevated again at 184/50.  But then it did come down some again before he came to the appointment today.   Review of Systems   BP 140/68   Pulse 63   Wt 173 lb (78.5 kg)   SpO2 97%   BMI 25.55 kg/m     Allergies  Allergen Reactions  . No Known Allergies     Past Medical History:  Diagnosis Date  . Cervical spondylosis with radiculopathy 03/08/2012  . COPD (chronic obstructive pulmonary disease) ([place])   . Dyspnea   . GERD (gastroesophageal reflux disease) once in a great while  . Hyperlipidemia 02/02/2018  . Pneumonia 10/2009  . Thyroid disease     Past Surgical History:  Procedure Laterality Date  . COLONOSCOPY    . HERNIA REPAIR     inguinal hernia repair on right and left  . INGUINAL HERNIA REPAIR Bilateral 04/21/2016   Procedure: LAPAROSCOPIC  LEFT INGUINAL HERNIA REPAIR AND RIGHT FEMORAL HERNIA REPAIR;  Surgeon: Michael Boston, MD;  Location: [place];  Service: General;  Laterality: Bilateral;  . INSERTION OF MESH Bilateral 04/21/2016   Procedure: INSERTION OF MESH LEFT INGUINAL HERNIA  AND INSERTION OF MESH RIGHT FEMORAL HERNIA;  Surgeon: Michael Boston, MD;  Location: [place];  Service: General;  Laterality: Bilateral;  . LEG SURGERY Right   . ROTATOR CUFF REPAIR  2005   Left  . TONSILLECTOMY      Social History   Socioeconomic History  . Marital status: Married    Spouse name: Not on file  . Number of children: 2  . Years of education: Not on file  . Highest education level: Not on file  Occupational History  . Occupation: retired  Scientific laboratory technician  . Financial resource strain: Not on file  . Food insecurity:    Worry: Not on file    Inability: Not on file  .  Transportation needs:    Medical: Not on file    Non-medical: Not on file  Tobacco Use  . Smoking status: Former Smoker    Types: Pipe    Last attempt to quit: 10/07/2009    Years since quitting: 8.3  . Smokeless tobacco: Never Used  Substance and Sexual Activity  . Alcohol use: Yes    Comment: occasional  . Drug use: No  . Sexual activity: Never  Lifestyle  . Physical activity:    Days per week: Not on file    Minutes per session: Not on file  . Stress: Not on file  Relationships  . Social connections:    Talks on phone: Not on file    Gets together: Not on file    Attends religious service: Not on file    Active member of club or organization: Not on file    Attends meetings of clubs or organizations: Not on file    Relationship status: Not on file  . Intimate partner violence:    Fear of current or ex partner: Not on file    Emotionally abused: Not on file    Physically abused: Not on file    Forced sexual activity: Not on file  Other Topics Concern  . Not on file  Social History Narrative   Power walks 3 days per week for 2 miles. Golfs twice a week.     Family History  Problem Relation Age of Onset  . Prostate cancer Father 34  . Heart disease Mother 12       deceased age 55  . Depression Brother        Commited suicide  . Depression Brother     Outpatient Encounter Medications as of 02/02/2018  Medication Sig  . albuterol (PROAIR HFA) 108 (90 BASE) MCG/ACT inhaler Inhale 2 puffs into the lungs every 6 (six) hours as needed.    . budesonide-formoterol (SYMBICORT) 160-4.5 MCG/ACT inhaler Inhale 2 puffs into the lungs 2 (two) times daily.  Marland Kitchen levothyroxine (SYNTHROID, LEVOTHROID) 112 MCG tablet TAKE 1 TABLET BY MOUTH DAILY BEFORE BREAKFAST.  Marland Kitchen Omega-3 Fatty Acids (FISH OIL) 1000 MG CAPS Take 1 capsule by mouth daily.  Marland Kitchen QUERCETIN PO Take 2 tablets by mouth daily. Pt reports taking 1 tablet daily  . tiotropium (SPIRIVA HANDIHALER) 18 MCG inhalation capsule Place 1  capsule (18 mcg total) into inhaler and inhale daily. (Patient taking differently: Place 18 mcg into inhaler and inhale 2 (two) times daily. )   No facility-administered encounter  medications on file as of 02/02/2018.           Objective:   Physical Exam  Constitutional: He is oriented to person, place, and time. He appears well-developed and well-nourished.  HENT:  Head: Normocephalic and atraumatic.  Right Ear: External ear normal.  Left Ear: External ear normal.  Nose: Nose normal.  Mouth/Throat: Oropharynx is clear and moist.  TMs and canals are clear.   Eyes: Pupils are equal, round, and reactive to light. Conjunctivae and EOM are normal.  Neck: Neck supple. No thyromegaly present.  Cardiovascular: Normal rate and normal heart sounds.  Pulmonary/Chest: Effort normal and breath sounds normal.  Lymphadenopathy:    He has no cervical adenopathy.  Neurological: He is alert and oriented to person, place, and time. He displays normal reflexes. No cranial nerve deficit. He exhibits normal muscle tone. Coordination normal.  Gait is normal.  Able to walk on toes and walk backwards without any difficulty.  Negative Romberg.  Skin: Skin is warm and dry.  Psychiatric: He has a normal mood and affect.        Assessment & Plan:  Transient alteration of awareness-I am concerned for the possibility of a TIA or mini stroke.  It sounds like he may have actually had a prior history in addition has some difficulty with word finding earlier in the day I think this is enough to warrant further evaluation with MRI of the brain.  I did encourage him to go ahead and restart his daily 81 mg aspirin.  Follow back up in 3 to 4 weeks to make sure that he is doing well.  We will contact him with MRI results as soon as possible.

## 2018-02-02 NOTE — Patient Instructions (Signed)
Please start taking Aspriin 81mg  QD.

## 2018-02-05 ENCOUNTER — Encounter: Payer: Self-pay | Admitting: Family Medicine

## 2018-02-06 ENCOUNTER — Ambulatory Visit (INDEPENDENT_AMBULATORY_CARE_PROVIDER_SITE_OTHER): Payer: Medicare Other | Admitting: Family Medicine

## 2018-02-06 ENCOUNTER — Encounter: Payer: Self-pay | Admitting: Family Medicine

## 2018-02-06 VITALS — BP 148/78 | HR 62 | Ht 69.0 in | Wt 173.0 lb

## 2018-02-06 DIAGNOSIS — I10 Essential (primary) hypertension: Secondary | ICD-10-CM

## 2018-02-06 DIAGNOSIS — Z23 Encounter for immunization: Secondary | ICD-10-CM

## 2018-02-06 DIAGNOSIS — Z0000 Encounter for general adult medical examination without abnormal findings: Secondary | ICD-10-CM

## 2018-02-06 MED ORDER — LISINOPRIL 5 MG PO TABS: 5.0000 mg | ORAL_TABLET | Freq: Every day | ORAL | 1 refills | Status: DC

## 2018-02-06 NOTE — Progress Notes (Signed)
Subjective:   Zachary Waters is a 77 y.o. male who presents for Medicare Annual/Subsequent preventive examination.  Recently had an acute episode of confusion most consistent with a transient alteration of awareness-we did an MRI which showed some vascular changes from hypertension but no sign of recent stroke.  We did discuss that this does not completely rule out a TIA which I am still concerned about he has started taking a baby aspirin daily.  Review of Systems:  Conference of review of systems is negative except for HPI. Cardiac Risk Factors include: advanced age (>78men, >86 women);male gender;dyslipidemia     Objective:    Vitals: BP (!) 148/78   Pulse 62   Ht 5\' 9"  (1.753 m)   Wt 173 lb (78.5 kg)   SpO2 98%   BMI 25.55 kg/m   Body mass index is 25.55 kg/m.  Advanced Directives 02/06/2018 02/24/2017 04/21/2016 01/13/2015 01/09/2014 08/06/2013  Does Patient Have a Medical Advance Directive? Yes No Yes Yes Yes Patient has advance directive, copy not in chart  Type of Advance Directive Living will;Healthcare Power of [place];Living will [place];Living will [place];Living will [place];Living will  Does patient want to make changes to medical advance directive? - - - No - Patient declined No - Patient declined -  Copy of [place] in Chart? No - copy requested - No - copy requested No - copy requested No - copy requested Copy requested from family    Tobacco Social History   Tobacco Use  Smoking Status Former Smoker  . Types: Pipe  . Last attempt to quit: 10/07/2009  . Years since quitting: 8.3  Smokeless Tobacco Never Used     Counseling given: Not Answered   Clinical Intake:  Pre-visit preparation completed: Yes  Pain : No/denies pain       Nutritional Status: BMI 25 -29 Overweight Diabetes: No     Interpreter Needed?: No  Information entered  by :: Beatrice Lecher MD  Past Medical History:  Diagnosis Date  . Cervical spondylosis with radiculopathy 03/08/2012  . COPD (chronic obstructive pulmonary disease) ([place])   . Dyspnea   . GERD (gastroesophageal reflux disease) once in a great while  . Hyperlipidemia 02/02/2018  . Pneumonia 10/2009  . Thyroid disease    Past Surgical History:  Procedure Laterality Date  . COLONOSCOPY    . HERNIA REPAIR     inguinal hernia repair on right and left  . INGUINAL HERNIA REPAIR Bilateral 04/21/2016   Procedure: LAPAROSCOPIC LEFT INGUINAL HERNIA REPAIR AND RIGHT FEMORAL HERNIA REPAIR;  Surgeon: Michael Boston, MD;  Location: [place];  Service: General;  Laterality: Bilateral;  . INSERTION OF MESH Bilateral 04/21/2016   Procedure: INSERTION OF MESH LEFT INGUINAL HERNIA  AND INSERTION OF MESH RIGHT FEMORAL HERNIA;  Surgeon: Michael Boston, MD;  Location: [place];  Service: General;  Laterality: Bilateral;  . LEG SURGERY Right   . ROTATOR CUFF REPAIR  2005   Left  . TONSILLECTOMY     Family History  Problem Relation Age of Onset  . Prostate cancer Father 16  . Heart disease Mother 61       deceased age 85  . Depression Brother        Commited suicide  . Depression Brother    Social History   Socioeconomic History  . Marital status: Married    Spouse name: Not on file  .  Number of children: 2  . Years of education: Not on file  . Highest education level: Not on file  Occupational History  . Occupation: retired  Scientific laboratory technician  . Financial resource strain: Not on file  . Food insecurity:    Worry: Not on file    Inability: Not on file  . Transportation needs:    Medical: Not on file    Non-medical: Not on file  Tobacco Use  . Smoking status: Former Smoker    Types: Pipe    Last attempt to quit: 10/07/2009    Years since quitting: 8.3  . Smokeless tobacco: Never Used  Substance and Sexual Activity  . Alcohol use: Yes    Comment: occasional  . Drug use: No  . Sexual activity:  Never  Lifestyle  . Physical activity:    Days per week: Not on file    Minutes per session: Not on file  . Stress: Not on file  Relationships  . Social connections:    Talks on phone: Not on file    Gets together: Not on file    Attends religious service: Not on file    Active member of club or organization: Not on file    Attends meetings of clubs or organizations: Not on file    Relationship status: Not on file  Other Topics Concern  . Not on file  Social History Narrative   Power walks 3 days per week for 2 miles. Golfs twice a week.     Outpatient Encounter Medications as of 02/06/2018  Medication Sig  . albuterol (PROAIR HFA) 108 (90 BASE) MCG/ACT inhaler Inhale 2 puffs into the lungs every 6 (six) hours as needed.    . budesonide-formoterol (SYMBICORT) 160-4.5 MCG/ACT inhaler Inhale 2 puffs into the lungs 2 (two) times daily.  Marland Kitchen levothyroxine (SYNTHROID, LEVOTHROID) 112 MCG tablet TAKE 1 TABLET BY MOUTH DAILY BEFORE BREAKFAST.  Marland Kitchen Omega-3 Fatty Acids (FISH OIL) 1000 MG CAPS Take 1 capsule by mouth daily.  Marland Kitchen QUERCETIN PO Take 2 tablets by mouth daily. Pt reports taking 1 tablet daily  . tiotropium (SPIRIVA HANDIHALER) 18 MCG inhalation capsule Place 1 capsule (18 mcg total) into inhaler and inhale daily. (Patient taking differently: Place 18 mcg into inhaler and inhale 2 (two) times daily. )  . lisinopril (PRINIVIL,ZESTRIL) 5 MG tablet Take 1 tablet (5 mg total) by mouth daily.   No facility-administered encounter medications on file as of 02/06/2018.     Activities of Daily Living In your present state of health, do you have any difficulty performing the following activities: 02/06/2018  Hearing? Y  Vision? N  Difficulty concentrating or making decisions? N  Walking or climbing stairs? N  Dressing or bathing? N  Doing errands, shopping? N  Preparing Food and eating ? N  Using the Toilet? N  In the past six months, have you accidently leaked urine? N  Do you have  problems with loss of bowel control? N  Managing your Medications? N  Managing your Finances? N  Housekeeping or managing your Housekeeping? N  Some recent data might be hidden    Patient Care Team: Hali Marry, MD as PCP - General (Family Medicine) Marcy Panning (Pulmonary Disease) Milas Kocher (Chiropractic Medicine) Michael Boston, MD as Consulting Physician (General Surgery)   Assessment:   This is a routine wellness examination for [facility].  Exercise Activities and Dietary recommendations Current Exercise Habits: Structured exercise class, Frequency (Times/Week): 6, Exercise limited by: None identified  Goals    . Patient Stated     Start Blood pressure medication. Monitor a few times a week       Fall Risk Fall Risk  02/06/2018 08/10/2017 02/02/2017 02/03/2016 01/13/2015  Falls in the past year? No No No No No    Depression Screen PHQ 2/9 Scores 02/06/2018 08/10/2017 02/02/2017 07/26/2016  PHQ - 2 Score 0 0 0 0    Cognitive Function     6CIT Screen 02/06/2018 02/02/2017 02/03/2016  What Year? 0 points 0 points 0 points  What month? 0 points 0 points 0 points  What time? 0 points 0 points 0 points  Count back from 20 0 points 0 points 0 points  Months in reverse 0 points 0 points 0 points  Repeat phrase 0 points 2 points 2 points  Total Score 0 2 2    Immunization History  Administered Date(s) Administered  . Influenza, High Dose Seasonal PF 02/02/2017  . Influenza,inj,Quad PF,6+ Mos 02/12/2013, 01/09/2014, 01/13/2015, 02/07/2016  . Pneumococcal Conjugate-13 08/06/2013  . Pneumococcal Polysaccharide-23 05/09/2010  . Tdap 01/03/2012  . Zoster 10/27/2010    Qualifies for Shingles Vaccine? Had it done at the [place].   Screening Tests Health Maintenance  Topic Date Due  . INFLUENZA VACCINE  04/06/2018 (Originally 12/07/2017)  . COLONOSCOPY  06/15/2019  . TETANUS/TDAP  01/02/2022  . PNA vac Low Risk Adult  Completed   Cancer Screenings: Lung: Low Dose CT Chest  recommended if Age 42-80 years, 30 pack-year currently smoking OR have quit w/in 15years. Patient does not qualify. Colorectal: Up to date.   Additional Screenings:  Hepatitis C Screening:      Plan:    Medicare Wellness   I have personally reviewed and noted the following in the patient's chart:   . Medical and social history . Use of alcohol, tobacco or illicit drugs  . Current medications and supplements . Functional ability and status . Nutritional status - good . Physical activity . Advanced directives . List of other physicians . Hospitalizations, surgeries, and ER visits in previous 12 months . Vitals . Screenings to include cognitive, depression, and falls . He will  send me an updated list of his providers so that we can update his care team. . Does have his [facility] visit at the [facility] for his pulmonary function today. Marland Kitchen HTN - new dx. Will start low dose ACE.  He will send me BP in a couple fo weeks.    In addition, I have reviewed and discussed with patient certain preventive protocols, quality metrics, and best practice recommendations. A written personalized care plan for preventive services as well as general preventive health recommendations were provided to patient.     Beatrice Lecher, MD  02/06/2018

## 2018-02-06 NOTE — Patient Instructions (Addendum)
Zachary Waters , Thank you for taking time to come for your Medicare Wellness Visit. I appreciate your ongoing commitment to your health goals. Please review the following plan we discussed and let me know if I can assist you in the future.   These are the goals we discussed: Goals    . Patient Stated     Start Blood pressure medication. Monitor a few times a week       This is a list of the screening recommended for you and due dates:  Health Maintenance  Topic Date Due  . Flu Shot  04/06/2018*  . Colon Cancer Screening  06/15/2019  . Tetanus Vaccine  01/02/2022  . Pneumonia vaccines  Completed  *Topic was postponed. The date shown is not the original due date.    When you run out of your current aspirin then pick up the 81 mg enteric-coated aspirin.  Keep an eye on your blood pressure about 3 to 4 days/week.  Check in the morning before you leave the house for the day. After a few weeks on the lisinopril 5 mg tab please send me your numbers and will take a look and then see if we need to adjust the dose on the medication.   Health Maintenance, Male A healthy lifestyle and preventive care is important for your health and wellness. Ask your health care provider about what schedule of regular examinations is right for you. What should I know about weight and diet? Eat a Healthy Diet  Eat plenty of vegetables, fruits, whole grains, low-fat dairy products, and lean protein.  Do not eat a lot of foods high in solid fats, added sugars, or salt.  Maintain a Healthy Weight Regular exercise can help you achieve or maintain a healthy weight. You should:  Do at least 150 minutes of exercise each week. The exercise should increase your heart rate and make you sweat (moderate-intensity exercise).  Do strength-training exercises at least twice a week.  Watch Your Levels of Cholesterol and Blood Lipids  Have your blood tested for lipids and cholesterol every 5 years starting at 75 years of  age. If you are at high risk for heart disease, you should start having your blood tested when you are 75 years old. You may need to have your cholesterol levels checked more often if: ? Your lipid or cholesterol levels are high. ? You are older than 75 years of age. ? You are at high risk for heart disease.  What should I know about cancer screening? Many types of cancers can be detected early and may often be prevented. Lung Cancer  You should be screened every year for lung cancer if: ? You are a current smoker who has smoked for at least 30 years. ? You are a former smoker who has quit within the past 15 years.  Talk to your health care provider about your screening options, when you should start screening, and how often you should be screened.  Colorectal Cancer  Routine colorectal cancer screening usually begins at 75 years of age and should be repeated every 5-10 years until you are 75 years old. You may need to be screened more often if early forms of precancerous polyps or small growths are found. Your health care provider may recommend screening at an earlier age if you have risk factors for colon cancer.  Your health care provider may recommend using home test kits to check for hidden blood in the stool.  A small  camera at the end of a tube can be used to examine your colon (sigmoidoscopy or colonoscopy). This checks for the earliest forms of colorectal cancer.  Prostate and Testicular Cancer  Depending on your age and overall health, your health care provider may do certain tests to screen for prostate and testicular cancer.  Talk to your health care provider about any symptoms or concerns you have about testicular or prostate cancer.  Skin Cancer  Check your skin from head to toe regularly.  Tell your health care provider about any new moles or changes in moles, especially if: ? There is a change in a mole's size, shape, or color. ? You have a mole that is larger than  a pencil eraser.  Always use sunscreen. Apply sunscreen liberally and repeat throughout the day.  Protect yourself by wearing long sleeves, pants, a wide-brimmed hat, and sunglasses when outside.  What should I know about heart disease, diabetes, and high blood pressure?  If you are 77-1 years of age, have your blood pressure checked every 3-5 years. If you are 49 years of age or older, have your blood pressure checked every year. You should have your blood pressure measured twice-once when you are at a hospital or clinic, and once when you are not at a hospital or clinic. Record the average of the two measurements. To check your blood pressure when you are not at a hospital or clinic, you can use: ? An automated blood pressure machine at a pharmacy. ? A home blood pressure monitor.  Talk to your health care provider about your target blood pressure.  If you are between 44-10 years old, ask your health care provider if you should take aspirin to prevent heart disease.  Have regular diabetes screenings by checking your fasting blood sugar level. ? If you are at a normal weight and have a low risk for diabetes, have this test once every three years after the age of 36. ? If you are overweight and have a high risk for diabetes, consider being tested at a younger age or more often.  A one-time screening for abdominal aortic aneurysm (AAA) by ultrasound is recommended for men aged 73-75 years who are current or former smokers. What should I know about preventing infection? Hepatitis B If you have a higher risk for hepatitis B, you should be screened for this virus. Talk with your health care provider to find out if you are at risk for hepatitis B infection. Hepatitis C Blood testing is recommended for:  Everyone born from [birth year] through 1963.  Anyone with known risk factors for hepatitis C.  Sexually Transmitted Diseases (STDs)  You should be screened each year for STDs including gonorrhea  and chlamydia if: ? You are sexually active and are younger than 75 years of age. ? You are older than 75 years of age and your health care provider tells you that you are at risk for this type of infection. ? Your sexual activity has changed since you were last screened and you are at an increased risk for chlamydia or gonorrhea. Ask your health care provider if you are at risk.  Talk with your health care provider about whether you are at high risk of being infected with HIV. Your health care provider may recommend a prescription medicine to help prevent HIV infection.  What else can I do?  Schedule regular health, dental, and eye exams.  Stay current with your vaccines (immunizations).  Do not use any tobacco  products, such as cigarettes, chewing tobacco, and e-cigarettes. If you need help quitting, ask your health care provider.  Limit alcohol intake to no more than 2 drinks per day. One drink equals 12 ounces of beer, 5 ounces of wine, or 1 ounces of hard liquor.  Do not use street drugs.  Do not share needles.  Ask your health care provider for help if you need support or information about quitting drugs.  Tell your health care provider if you often feel depressed.  Tell your health care provider if you have ever been abused or do not feel safe at home. This information is not intended to replace advice given to you by your health care provider. Make sure you discuss any questions you have with your health care provider. Document Released: 10/22/2007 Document Revised: 12/23/2015 Document Reviewed: 01/27/2015 Elsevier Interactive Patient Education  [employer].

## 2018-02-19 ENCOUNTER — Encounter: Payer: Self-pay | Admitting: Family Medicine

## 2018-02-21 ENCOUNTER — Encounter: Payer: Self-pay | Admitting: Family Medicine

## 2018-02-28 ENCOUNTER — Other Ambulatory Visit: Payer: Self-pay | Admitting: Family Medicine

## 2018-03-01 ENCOUNTER — Encounter: Payer: Self-pay | Admitting: Family Medicine

## 2018-03-05 ENCOUNTER — Encounter: Payer: Self-pay | Admitting: Family Medicine

## 2018-03-05 ENCOUNTER — Ambulatory Visit (INDEPENDENT_AMBULATORY_CARE_PROVIDER_SITE_OTHER): Payer: Medicare Other | Admitting: Family Medicine

## 2018-03-05 VITALS — BP 150/80 | HR 78 | Temp 98.1°F | Ht 68.0 in | Wt 173.0 lb

## 2018-03-05 DIAGNOSIS — J441 Chronic obstructive pulmonary disease with (acute) exacerbation: Secondary | ICD-10-CM

## 2018-03-05 MED ORDER — AZITHROMYCIN 250 MG PO TABS: 250.0000 mg | ORAL_TABLET | Freq: Every day | ORAL | 0 refills | Status: DC

## 2018-03-05 MED ORDER — PREDNISONE 50 MG PO TABS: 50.0000 mg | ORAL_TABLET | Freq: Every day | ORAL | 0 refills | Status: DC

## 2018-03-05 MED ORDER — BENZONATATE 200 MG PO CAPS: 200.0000 mg | ORAL_CAPSULE | Freq: Three times a day (TID) | ORAL | 0 refills | Status: DC | PRN

## 2018-03-05 NOTE — Patient Instructions (Signed)
Thank you for coming in today. Take prednisone and azithromycin  Use tessalon pearles for cough as needed.  Call or go to the emergency room if you get worse, have trouble breathing, have chest pains, or palpitations.   Continue albuterol inhaler for shortness of breath or bad cough as needed.    Chronic Obstructive Pulmonary Disease Exacerbation Chronic obstructive pulmonary disease (COPD) is a common lung problem. In COPD, the flow of air from the lungs is limited. COPD exacerbations are times that breathing gets worse and you need extra treatment. Without treatment they can be life threatening. If they happen often, your lungs can become more damaged. If your COPD gets worse, your doctor may treat you with:  Medicines.  Oxygen.  Different ways to clear your airway, such as using a mask.  Follow these instructions at home:  Do not smoke.  Avoid tobacco smoke and other things that bother your lungs.  If given, take your antibiotic medicine as told. Finish the medicine even if you start to feel better.  Only take medicines as told by your doctor.  Drink enough fluids to keep your pee (urine) clear or pale yellow (unless your doctor has told you not to).  Use a cool mist machine (vaporizer).  If you use oxygen or a machine that turns liquid medicine into a mist (nebulizer), continue to use them as told.  Keep up with shots (vaccinations) as told by your doctor.  Exercise regularly.  Eat healthy foods.  Keep all doctor visits as told. Get help right away if:  You are very short of breath and it gets worse.  You have trouble talking.  You have bad chest pain.  You have blood in your spit (sputum).  You have a fever.  You keep throwing up (vomiting).  You feel weak, or you pass out (faint).  You feel confused.  You keep getting worse. This information is not intended to replace advice given to you by your health care provider. Make sure you discuss any questions  you have with your health care provider. Document Released: 04/14/2011 Document Revised: 10/01/2015 Document Reviewed: 12/28/2012 Elsevier Interactive Patient Education  2017 [employer].

## 2018-03-05 NOTE — Progress Notes (Signed)
Zachary Waters is a 77 y.o. male who presents to [place]: [place] today for cough congestion runny nose wheezing shortness of breath.  Symptoms present 1.5 weeks worsening over the last several days.  Symptoms are consistent with previous episodes of COPD exacerbation.  He is tried some over-the-counter medications as well as his albuterol inhaler which helps some.  He continues his regular use of Symbicort and Spiriva.  No fevers chills chest pain or palpitations.  Patient notes some nausea as well.   ROS as above:  Exam:  BP (!) 150/80   Pulse 78   Temp 98.1 F (36.7 C) (Oral)   Ht 5\' 8"  (1.727 m)   Wt 173 lb (78.5 kg)   SpO2 96%   BMI 26.30 kg/m  Wt Readings from Last 5 Encounters:  03/05/18 173 lb (78.5 kg)  02/06/18 173 lb (78.5 kg)  02/02/18 173 lb (78.5 kg)  02/01/18 170 lb (77.1 kg)  11/30/17 174 lb (78.9 kg)    Gen: Well NAD HEENT: EOMI,  MMM Lungs: Normal work of breathing.  Coarse breath sounds present bilaterally with prolonged expiratory phase and slight wheezing.  Clear nasal discharge.  Normal posterior pharynx and tympanic membranes. Heart: RRR no MRG Abd: NABS, Soft. Nondistended, Nontender Exts: Brisk capillary refill, warm and well perfused.   Lab and Radiology Results No results found for this or any previous visit (from the past 72 hour(s)). No results found.    Assessment and Plan: 77 y.o. male with COPD exacerbation.  Treat with prednisone and azithromycin and Tessalon Perles.  Use albuterol as needed.  Continued inhalers.  Recheck if not improving.  Return sooner if needed.  Precautions reviewed.   No orders of the defined types were placed in this encounter.  Meds ordered this encounter  Medications  . predniSONE (DELTASONE) 50 MG tablet    Sig: Take 1 tablet (50 mg total) by mouth daily.    Dispense:  5 tablet    Refill:   0  . azithromycin (ZITHROMAX) 250 MG tablet    Sig: Take 1 tablet (250 mg total) by mouth daily. Take first 2 tablets together, then 1 every day until finished.    Dispense:  6 tablet    Refill:  0  . benzonatate (TESSALON) 200 MG capsule    Sig: Take 1 capsule (200 mg total) by mouth 3 (three) times daily as needed for cough.    Dispense:  45 capsule    Refill:  0     Historical information moved to improve visibility of documentation.  Past Medical History:  Diagnosis Date  . Cervical spondylosis with radiculopathy 03/08/2012  . COPD (chronic obstructive pulmonary disease) ([place])   . Dyspnea   . GERD (gastroesophageal reflux disease) once in a great while  . Hyperlipidemia 02/02/2018  . Pneumonia 10/2009  . Thyroid disease    Past Surgical History:  Procedure Laterality Date  . COLONOSCOPY    . HERNIA REPAIR     inguinal hernia repair on right and left  . INGUINAL HERNIA REPAIR Bilateral 04/21/2016   Procedure: LAPAROSCOPIC LEFT INGUINAL HERNIA REPAIR AND RIGHT FEMORAL HERNIA REPAIR;  Surgeon: Michael Boston, MD;  Location: [place];  Service: General;  Laterality: Bilateral;  . INSERTION OF MESH Bilateral 04/21/2016   Procedure: INSERTION OF MESH LEFT INGUINAL HERNIA  AND INSERTION OF MESH RIGHT FEMORAL HERNIA;  Surgeon: Michael Boston, MD;  Location: [place];  Service:  General;  Laterality: Bilateral;  . LEG SURGERY Right   . ROTATOR CUFF REPAIR  2005   Left  . TONSILLECTOMY     Social History   Tobacco Use  . Smoking status: Former Smoker    Types: Pipe    Last attempt to quit: 10/07/2009    Years since quitting: 8.4  . Smokeless tobacco: Never Used  Substance Use Topics  . Alcohol use: Yes    Comment: occasional   family history includes Depression in his brother and brother; Heart disease (age of onset: 76) in his mother; Prostate cancer (age of onset: 36) in his father.  Medications: Current Outpatient Medications  Medication Sig Dispense Refill  . albuterol (PROAIR  HFA) 108 (90 BASE) MCG/ACT inhaler Inhale 2 puffs into the lungs every 6 (six) hours as needed.      . budesonide-formoterol (SYMBICORT) 160-4.5 MCG/ACT inhaler Inhale 2 puffs into the lungs 2 (two) times daily.    Marland Kitchen levothyroxine (SYNTHROID, LEVOTHROID) 112 MCG tablet TAKE 1 TABLET BY MOUTH DAILY BEFORE BREAKFAST. 90 tablet 1  . lisinopril (PRINIVIL,ZESTRIL) 5 MG tablet Take 1 tablet (5 mg total) by mouth daily. 30 tablet 1  . Omega-3 Fatty Acids (FISH OIL) 1000 MG CAPS Take 1 capsule by mouth daily.    Marland Kitchen QUERCETIN PO Take 2 tablets by mouth daily. Pt reports taking 1 tablet daily    . tiotropium (SPIRIVA HANDIHALER) 18 MCG inhalation capsule Place 1 capsule (18 mcg total) into inhaler and inhale daily. (Patient taking differently: Place 18 mcg into inhaler and inhale 2 (two) times daily. ) 1 capsule 0  . azithromycin (ZITHROMAX) 250 MG tablet Take 1 tablet (250 mg total) by mouth daily. Take first 2 tablets together, then 1 every day until finished. 6 tablet 0  . benzonatate (TESSALON) 200 MG capsule Take 1 capsule (200 mg total) by mouth 3 (three) times daily as needed for cough. 45 capsule 0  . predniSONE (DELTASONE) 50 MG tablet Take 1 tablet (50 mg total) by mouth daily. 5 tablet 0   No current facility-administered medications for this visit.    Allergies  Allergen Reactions  . No Known Allergies      Discussed warning signs or symptoms. Please see discharge instructions. Patient expresses understanding.

## 2018-03-23 ENCOUNTER — Other Ambulatory Visit: Payer: Self-pay | Admitting: Family Medicine

## 2018-03-30 ENCOUNTER — Encounter: Payer: Self-pay | Admitting: Family Medicine

## 2018-04-03 ENCOUNTER — Encounter: Payer: Self-pay | Admitting: Family Medicine

## 2018-04-03 MED ORDER — LISINOPRIL 10 MG PO TABS: 10.0000 mg | ORAL_TABLET | Freq: Every day | ORAL | 1 refills | Status: DC

## 2018-04-17 DIAGNOSIS — J449 Chronic obstructive pulmonary disease, unspecified: Secondary | ICD-10-CM | POA: Diagnosis not present

## 2018-04-17 DIAGNOSIS — R0609 Other forms of dyspnea: Secondary | ICD-10-CM | POA: Diagnosis not present

## 2018-04-17 DIAGNOSIS — J441 Chronic obstructive pulmonary disease with (acute) exacerbation: Secondary | ICD-10-CM | POA: Diagnosis not present

## 2018-04-23 ENCOUNTER — Other Ambulatory Visit: Payer: Self-pay | Admitting: Family Medicine

## 2018-04-23 NOTE — Telephone Encounter (Signed)
Needs TSH rechecked

## 2018-05-15 DIAGNOSIS — J449 Chronic obstructive pulmonary disease, unspecified: Secondary | ICD-10-CM | POA: Diagnosis not present

## 2018-05-22 DIAGNOSIS — L570 Actinic keratosis: Secondary | ICD-10-CM | POA: Diagnosis not present

## 2018-05-22 DIAGNOSIS — Z85828 Personal history of other malignant neoplasm of skin: Secondary | ICD-10-CM | POA: Diagnosis not present

## 2018-05-22 DIAGNOSIS — Z08 Encounter for follow-up examination after completed treatment for malignant neoplasm: Secondary | ICD-10-CM | POA: Diagnosis not present

## 2018-05-22 DIAGNOSIS — L821 Other seborrheic keratosis: Secondary | ICD-10-CM | POA: Diagnosis not present

## 2018-05-22 DIAGNOSIS — L820 Inflamed seborrheic keratosis: Secondary | ICD-10-CM | POA: Diagnosis not present

## 2018-07-05 ENCOUNTER — Encounter: Payer: Self-pay | Admitting: Sports Medicine

## 2018-07-05 ENCOUNTER — Ambulatory Visit (INDEPENDENT_AMBULATORY_CARE_PROVIDER_SITE_OTHER): Payer: Medicare Other

## 2018-07-05 ENCOUNTER — Ambulatory Visit (INDEPENDENT_AMBULATORY_CARE_PROVIDER_SITE_OTHER): Payer: Medicare Other | Admitting: Sports Medicine

## 2018-07-05 VITALS — BP 181/66 | HR 72 | Ht 68.0 in | Wt 180.0 lb

## 2018-07-05 DIAGNOSIS — R0602 Shortness of breath: Secondary | ICD-10-CM | POA: Diagnosis not present

## 2018-07-05 DIAGNOSIS — J441 Chronic obstructive pulmonary disease with (acute) exacerbation: Secondary | ICD-10-CM | POA: Diagnosis not present

## 2018-07-05 DIAGNOSIS — I509 Heart failure, unspecified: Secondary | ICD-10-CM

## 2018-07-05 LAB — CBC WITH DIFFERENTIAL/PLATELET
Absolute Monocytes: 447 cells/uL (ref 200–950)
Basophils Absolute: 38 cells/uL (ref 0–200)
Basophils Relative: 0.8 %
Eosinophils Absolute: 197 cells/uL (ref 15–500)
Eosinophils Relative: 4.2 %
HCT: 43.3 % (ref 38.5–50.0)
Hemoglobin: 14.5 g/dL (ref 13.2–17.1)
Lymphs Abs: 616 cells/uL — ABNORMAL LOW (ref 850–3900)
MCH: 31.3 pg (ref 27.0–33.0)
MCHC: 33.5 g/dL (ref 32.0–36.0)
MCV: 93.3 fL (ref 80.0–100.0)
MPV: 10.2 fL (ref 7.5–12.5)
Monocytes Relative: 9.5 %
Neutro Abs: 3403 {cells}/uL (ref 1500–7800)
Neutrophils Relative %: 72.4 %
Platelets: 234 Thousand/uL (ref 140–400)
RBC: 4.64 Million/uL (ref 4.20–5.80)
RDW: 12.4 % (ref 11.0–15.0)
Total Lymphocyte: 13.1 %
WBC: 4.7 Thousand/uL (ref 3.8–10.8)

## 2018-07-05 LAB — D-DIMER, QUANTITATIVE: D-Dimer, Quant: 0.7 mcg/mL FEU — ABNORMAL HIGH (ref <0.50)

## 2018-07-05 LAB — TROPONIN I: Troponin I: <0.01 ng/mL (ref <=0.0)

## 2018-07-05 LAB — BRAIN NATRIURETIC PEPTIDE: Brain Natriuretic Peptide: 20 pg/mL (ref <100)

## 2018-07-05 MED ORDER — PREDNISONE 50 MG PO TABS: 50.0000 mg | ORAL_TABLET | Freq: Every day | ORAL | 0 refills | Status: DC

## 2018-07-05 MED ORDER — DOXYCYCLINE HYCLATE 100 MG PO TABS: 100.0000 mg | ORAL_TABLET | Freq: Two times a day (BID) | ORAL | 0 refills | Status: AC

## 2018-07-05 NOTE — Progress Notes (Addendum)
Subjective:    CC: Shortness of breath and fatigue  HPI: This is a pleasant 78 year old male with a history of COPD.  For the past few weeks he is noted worsening fatigue, shortness of breath.  Cough, no chest pain.  No fevers, chills, no other constitutional symptoms.  Symptoms are moderate, persistent.  No leg swelling, no orthopnea, no PND.  Fatigue is one of his most severe symptoms right now.  It is beyond what he typically feels for usual COPD exacerbations.  Otherwise when he is not sick his COPD is well controlled with Symbicort and Spiriva.  Blood pressure has been elevated over the past couple of weeks, no headaches, visual changes, chest pain.  No nausea, no diaphoresis.  I reviewed the past medical history, family history, social history, surgical history, and allergies today and no changes were needed.  Please see the problem list section below in epic for further details.  Past Medical History: Past Medical History:  Diagnosis Date  . Cervical spondylosis with radiculopathy 03/08/2012  . COPD (chronic obstructive pulmonary disease) ([place])   . Dyspnea   . Essential hypertension   . GERD (gastroesophageal reflux disease) once in a great while  . Hyperlipidemia 02/02/2018  . Pneumonia 10/2009  . Thyroid disease    Past Surgical History: Past Surgical History:  Procedure Laterality Date  . COLONOSCOPY    . HERNIA REPAIR     inguinal hernia repair on right and left  . INGUINAL HERNIA REPAIR Bilateral 04/21/2016   Procedure: LAPAROSCOPIC LEFT INGUINAL HERNIA REPAIR AND RIGHT FEMORAL HERNIA REPAIR;  Surgeon: Michael Boston, MD;  Location: [facility];  Service: General;  Laterality: Bilateral;  . INSERTION OF MESH Bilateral 04/21/2016   Procedure: INSERTION OF MESH LEFT INGUINAL HERNIA  AND INSERTION OF MESH RIGHT FEMORAL HERNIA;  Surgeon: Michael Boston, MD;  Location: [facility];  Service: General;  Laterality: Bilateral;  . LEG SURGERY Right   . ROTATOR CUFF REPAIR  2005   Left  .  TONSILLECTOMY     Social History: Social History   Socioeconomic History  . Marital status: Married    Spouse name: Not on file  . Number of children: 2  . Years of education: Not on file  . Highest education level: Not on file  Occupational History  . Occupation: retired  Scientific laboratory technician  . Financial resource strain: Not on file  . Food insecurity:    Worry: Not on file    Inability: Not on file  . Transportation needs:    Medical: Not on file    Non-medical: Not on file  Tobacco Use  . Smoking status: Former Smoker    Types: Pipe    Last attempt to quit: 10/07/2009    Years since quitting: 8.7  . Smokeless tobacco: Never Used  Substance and Sexual Activity  . Alcohol use: Yes    Comment: occasional  . Drug use: No  . Sexual activity: Never  Lifestyle  . Physical activity:    Days per week: Not on file    Minutes per session: Not on file  . Stress: Not on file  Relationships  . Social connections:    Talks on phone: Not on file    Gets together: Not on file    Attends religious service: Not on file    Active member of club or organization: Not on file    Attends meetings of clubs or organizations: Not on file    Relationship status: Not on file  Other Topics Concern  . Not on file  Social History Narrative   Power walks 3 days per week for 2 miles. Golfs twice a week.    Family History: Family History  Problem Relation Age of Onset  . Prostate cancer Father 30  . Heart disease Mother 53       deceased age 76  . Depression Brother        Commited suicide  . Depression Brother    Allergies: Allergies  Allergen Reactions  . No Known Allergies    Medications: See med rec.  Review of Systems: No fevers, chills, night sweats, weight loss, chest pain, or shortness of breath.   Objective:    General: Well Developed, well nourished, and in no acute distress.  Neuro: Alert and oriented x3, extra-ocular muscles intact, sensation grossly intact.  HEENT:  Normocephalic, atraumatic, pupils equal round reactive to light, neck supple, no masses, no lymphadenopathy, thyroid nonpalpable.  Oropharynx, nasopharynx, ear canals unremarkable, right hearing aid had to be removed, he had a bit of nonobstructive cerumen on the right. Skin: Warm and dry, no rashes. Cardiac: Regular rate and rhythm, no murmurs rubs or gallops, no lower extremity edema.  Respiratory: Not really moving good air.. Not using accessory muscles, speaking in full sentences.  Impression and Recommendations:    Chronic obstructive pulmonary disease ([place]) Historically well controlled with Symbicort and Spiriva. He does see Dr. Michela Pitcher. Unfortunately he is having increasing shortness of breath, fatigue, cough. Moving great air on auscultation. Adding prednisone, doxycycline. Chest x-ray. I do want some labs considering his profound fatigue.  SOB (shortness of breath) Elevated d-dimer, adding a stat CT angiogram of the pulmonary arteries, he should be able to come here and do it.  CMP is still pending but he does have normal renal function from a few months ago. ___________________________________________ Gwen Her. Dianah Field, M.D., ABFM., CAQSM. Primary Care and Sports Medicine [facility]  Adjunct Professor of [employer]

## 2018-07-05 NOTE — Assessment & Plan Note (Signed)
Elevated d-dimer, adding a stat CT angiogram of the pulmonary arteries, he should be able to come here and do it.  CMP is still pending but he does have normal renal function from a few months ago.

## 2018-07-05 NOTE — Assessment & Plan Note (Signed)
Historically well controlled with Symbicort and Spiriva. He does see Dr. Michela Pitcher. Unfortunately he is having increasing shortness of breath, fatigue, cough. Moving great air on auscultation. Adding prednisone, doxycycline. Chest x-ray. I do want some labs considering his profound fatigue.

## 2018-07-05 NOTE — Addendum Note (Signed)
Addended by: Silverio Decamp on: 07/05/2018 03:38 PM   Modules accepted: Orders

## 2018-07-06 ENCOUNTER — Ambulatory Visit (INDEPENDENT_AMBULATORY_CARE_PROVIDER_SITE_OTHER): Payer: Medicare Other

## 2018-07-06 ENCOUNTER — Telehealth: Payer: Self-pay

## 2018-07-06 DIAGNOSIS — I700 Atherosclerosis of aorta: Secondary | ICD-10-CM | POA: Diagnosis not present

## 2018-07-06 DIAGNOSIS — J439 Emphysema, unspecified: Secondary | ICD-10-CM

## 2018-07-06 DIAGNOSIS — R0602 Shortness of breath: Secondary | ICD-10-CM | POA: Diagnosis not present

## 2018-07-06 LAB — COMPREHENSIVE METABOLIC PANEL
AG Ratio: 2.3 (calc) (ref 1.0–2.5)
ALT: 14 U/L (ref 9–46)
Albumin: 4.3 g/dL (ref 3.6–5.1)
Alkaline phosphatase (APISO): 53 U/L (ref 35–144)
BUN: 16 mg/dL (ref 7–25)
CO2: 27 mmol/L (ref 20–32)
Calcium: 9.1 mg/dL (ref 8.6–10.3)
Creat: 1.04 mg/dL (ref 0.70–1.18)
Potassium: 4.9 mmol/L (ref 3.5–5.3)
Sodium: 141 mmol/L (ref 135–146)
Total Bilirubin: 0.4 mg/dL (ref 0.2–1.2)

## 2018-07-06 LAB — CK TOTAL AND CKMB (NOT AT ARMC)
CK, MB: 3.6 ng/mL (ref 0–5.0)
Relative Index: 3.6 (ref 0–4.0)
Total CK: 99 U/L (ref 44–196)

## 2018-07-06 LAB — COMPREHENSIVE METABOLIC PANEL WITH GFR
AST: 16 U/L (ref 10–35)
Chloride: 105 mmol/L (ref 98–110)
Globulin: 1.9 g/dL (ref 1.9–3.7)
Glucose, Bld: 75 mg/dL (ref 65–99)
Total Protein: 6.2 g/dL (ref 6.1–8.1)

## 2018-07-06 LAB — TSH: TSH: 1.13 mIU/L (ref 0.40–4.50)

## 2018-07-06 MED ORDER — IOPAMIDOL (ISOVUE-370) INJECTION 76%
100.0000 mL | Freq: Once | INTRAVENOUS | Status: AC | PRN
  Administered 2018-07-06: 100 mL via INTRAVENOUS

## 2018-07-06 NOTE — Telephone Encounter (Signed)
Zachary Waters called and left a message asking if the emphysema would cause his symptoms. If not what would be the next step. Please advise.

## 2018-07-06 NOTE — Telephone Encounter (Signed)
Yes, emphysema is a type of COPD which we know he already has.  Treatment is to treat the COPD exacerbation as we have already done with the prednisone and doxycycline.

## 2018-07-06 NOTE — Telephone Encounter (Signed)
Patient advised of recommendations.  

## 2018-07-08 ENCOUNTER — Encounter: Payer: Self-pay | Admitting: Family Medicine

## 2018-07-19 ENCOUNTER — Other Ambulatory Visit: Payer: Self-pay

## 2018-07-19 ENCOUNTER — Ambulatory Visit (INDEPENDENT_AMBULATORY_CARE_PROVIDER_SITE_OTHER): Payer: Medicare Other | Admitting: Family Medicine

## 2018-07-19 ENCOUNTER — Encounter: Payer: Self-pay | Admitting: Family Medicine

## 2018-07-19 VITALS — BP 146/69 | HR 59 | Ht 68.0 in | Wt 177.0 lb

## 2018-07-19 DIAGNOSIS — J432 Centrilobular emphysema: Secondary | ICD-10-CM

## 2018-07-19 DIAGNOSIS — I10 Essential (primary) hypertension: Secondary | ICD-10-CM

## 2018-07-19 MED ORDER — LISINOPRIL 20 MG PO TABS: 20.0000 mg | ORAL_TABLET | Freq: Every day | ORAL | 1 refills | Status: DC

## 2018-07-19 NOTE — Patient Instructions (Signed)
DASH Eating Plan  DASH stands for "Dietary Approaches to Stop Hypertension." The DASH eating plan is a healthy eating plan that has been shown to reduce high blood pressure (hypertension). It may also reduce your risk for type 2 diabetes, heart disease, and stroke. The DASH eating plan may also help with weight loss.  What are tips for following this plan?    General guidelines   Avoid eating more than 2,300 mg (milligrams) of salt (sodium) a day. If you have hypertension, you may need to reduce your sodium intake to 1,500 mg a day.   Limit alcohol intake to no more than 1 drink a day for nonpregnant women and 2 drinks a day for men. One drink equals 12 oz of beer, 5 oz of wine, or 1 oz of hard liquor.   Work with your health care provider to maintain a healthy body weight or to lose weight. Ask what an ideal weight is for you.   Get at least 30 minutes of exercise that causes your heart to beat faster (aerobic exercise) most days of the week. Activities may include walking, swimming, or biking.   Work with your health care provider or diet and nutrition specialist (dietitian) to adjust your eating plan to your individual calorie needs.  Reading food labels     Check food labels for the amount of sodium per serving. Choose foods with less than 5 percent of the Daily Value of sodium. Generally, foods with less than 300 mg of sodium per serving fit into this eating plan.   To find whole grains, look for the word "whole" as the first word in the ingredient list.  Shopping   Buy products labeled as "low-sodium" or "no salt added."   Buy fresh foods. Avoid canned foods and premade or frozen meals.  Cooking   Avoid adding salt when cooking. Use salt-free seasonings or herbs instead of table salt or sea salt. Check with your health care provider or pharmacist before using salt substitutes.   Do not fry foods. Cook foods using healthy methods such as baking, boiling, grilling, and broiling instead.   Cook with  heart-healthy oils, such as olive, canola, soybean, or sunflower oil.  Meal planning   Eat a balanced diet that includes:  ? 5 or more servings of fruits and vegetables each day. At each meal, try to fill half of your plate with fruits and vegetables.  ? Up to 6-8 servings of whole grains each day.  ? Less than 6 oz of lean meat, poultry, or fish each day. A 3-oz serving of meat is about the same size as a deck of cards. One egg equals 1 oz.  ? 2 servings of low-fat dairy each day.  ? A serving of nuts, seeds, or beans 5 times each week.  ? Heart-healthy fats. Healthy fats called Omega-3 fatty acids are found in foods such as flaxseeds and coldwater fish, like sardines, salmon, and mackerel.   Limit how much you eat of the following:  ? Canned or prepackaged foods.  ? Food that is high in trans fat, such as fried foods.  ? Food that is high in saturated fat, such as fatty meat.  ? Sweets, desserts, sugary drinks, and other foods with added sugar.  ? Full-fat dairy products.   Do not salt foods before eating.   Try to eat at least 2 vegetarian meals each week.   Eat more home-cooked food and less restaurant, buffet, and fast food.     When eating at a restaurant, ask that your food be prepared with less salt or no salt, if possible.  What foods are recommended?  The items listed may not be a complete list. Talk with your dietitian about what dietary choices are best for you.  Grains  Whole-grain or whole-wheat bread. Whole-grain or whole-wheat pasta. Brown rice. Oatmeal. Quinoa. Bulgur. Whole-grain and low-sodium cereals. Pita bread. Low-fat, low-sodium crackers. Whole-wheat flour tortillas.  Vegetables  Fresh or frozen vegetables (raw, steamed, roasted, or grilled). Low-sodium or reduced-sodium tomato and vegetable juice. Low-sodium or reduced-sodium tomato sauce and tomato paste. Low-sodium or reduced-sodium canned vegetables.  Fruits  All fresh, dried, or frozen fruit. Canned fruit in natural juice (without  added sugar).  Meat and other protein foods  Skinless chicken or turkey. Ground chicken or turkey. Pork with fat trimmed off. Fish and seafood. Egg whites. Dried beans, peas, or lentils. Unsalted nuts, nut butters, and seeds. Unsalted canned beans. Lean cuts of beef with fat trimmed off. Low-sodium, lean deli meat.  Dairy  Low-fat (1%) or fat-free (skim) milk. Fat-free, low-fat, or reduced-fat cheeses. Nonfat, low-sodium ricotta or cottage cheese. Low-fat or nonfat yogurt. Low-fat, low-sodium cheese.  Fats and oils  Soft margarine without trans fats. Vegetable oil. Low-fat, reduced-fat, or light mayonnaise and salad dressings (reduced-sodium). Canola, safflower, olive, soybean, and sunflower oils. Avocado.  Seasoning and other foods  Herbs. Spices. Seasoning mixes without salt. Unsalted popcorn and pretzels. Fat-free sweets.  What foods are not recommended?  The items listed may not be a complete list. Talk with your dietitian about what dietary choices are best for you.  Grains  Baked goods made with fat, such as croissants, muffins, or some breads. Dry pasta or rice meal packs.  Vegetables  Creamed or fried vegetables. Vegetables in a cheese sauce. Regular canned vegetables (not low-sodium or reduced-sodium). Regular canned tomato sauce and paste (not low-sodium or reduced-sodium). Regular tomato and vegetable juice (not low-sodium or reduced-sodium). Pickles. Olives.  Fruits  Canned fruit in a light or heavy syrup. Fried fruit. Fruit in cream or butter sauce.  Meat and other protein foods  Fatty cuts of meat. Ribs. Fried meat. Bacon. Sausage. Bologna and other processed lunch meats. Salami. Fatback. Hotdogs. Bratwurst. Salted nuts and seeds. Canned beans with added salt. Canned or smoked fish. Whole eggs or egg yolks. Chicken or turkey with skin.  Dairy  Whole or 2% milk, cream, and half-and-half. Whole or full-fat cream cheese. Whole-fat or sweetened yogurt. Full-fat cheese. Nondairy creamers. Whipped toppings.  Processed cheese and cheese spreads.  Fats and oils  Butter. Stick margarine. Lard. Shortening. Ghee. Bacon fat. Tropical oils, such as coconut, palm kernel, or palm oil.  Seasoning and other foods  Salted popcorn and pretzels. Onion salt, garlic salt, seasoned salt, table salt, and sea salt. Worcestershire sauce. Tartar sauce. Barbecue sauce. Teriyaki sauce. Soy sauce, including reduced-sodium. Steak sauce. Canned and packaged gravies. Fish sauce. Oyster sauce. Cocktail sauce. Horseradish that you find on the shelf. Ketchup. Mustard. Meat flavorings and tenderizers. Bouillon cubes. Hot sauce and Tabasco sauce. Premade or packaged marinades. Premade or packaged taco seasonings. Relishes. Regular salad dressings.  Where to find more information:   National Heart, Lung, and Blood Institute: www.nhlbi.nih.gov   American Heart Association: www.heart.org  Summary   The DASH eating plan is a healthy eating plan that has been shown to reduce high blood pressure (hypertension). It may also reduce your risk for type 2 diabetes, heart disease, and stroke.   With the   DASH eating plan, you should limit salt (sodium) intake to 2,300 mg a day. If you have hypertension, you may need to reduce your sodium intake to 1,500 mg a day.   When on the DASH eating plan, aim to eat more fresh fruits and vegetables, whole grains, lean proteins, low-fat dairy, and heart-healthy fats.   Work with your health care provider or diet and nutrition specialist (dietitian) to adjust your eating plan to your individual calorie needs.  This information is not intended to replace advice given to you by your health care provider. Make sure you discuss any questions you have with your health care provider.  Document Released: 04/14/2011 Document Revised: 04/18/2016 Document Reviewed: 04/18/2016  Elsevier Interactive Patient Education  2019 Elsevier Inc.

## 2018-07-19 NOTE — Progress Notes (Signed)
Subjective:    CC:   HPI: 78 year old male with a history of underlying COPD is here today for follow-up.  He actually just saw 1 of my partners about 3 weeks ago for worsening fatigue and shortness of breath, and cough.  Before that he has been well controlled with Symbicort and Spiriva.  But it also noticed that his blood pressure had been elevating up and down over the last several weeks in fact he had actually sent me a MyChart message.  After starting lisinopril for several months his blood pressures were consistently under 130/70 in the mornings and even after exercise.  But more recently they have been significantly elevated in the 140s and even low 150s over 70s with a pulse mostly in the 60s.  He is checked it at different times a day.    He was given a round of doxycycline and prednisone for possible COPD exacerbation when he was here 3 weeks ago. Marland Kitchen  His thyroid and cardiac enzymes and blood count were normal.  Was followed by a CT angios of the chest to rule out pulmonary embolism.  It was negative it did just show some scattered atherosclerotic plaque formation in the thoracic aorta.  Overall he has felt better but has still been tracking his blood pressures and they have been running a little bit higher though not quite as high as they were previously.  He has been tracking his BPs and they have been in the 140-150s.  He has not been taking any NSAIDs or decongestants etc.  His weight is actually up about 10 pounds from September.  BNP was normal.  D-dimer was just borderline elevated.  Past medical history, Surgical history, Family history not pertinant except as noted below, Social history, Allergies, and medications have been entered into the medical record, reviewed, and corrections made.   Review of Systems: No fevers, chills, night sweats, weight loss, chest pain, or shortness of breath.   Objective:    General: Well Developed, well nourished, and in no acute distress.  Neuro:  Alert and oriented x3, extra-ocular muscles intact, sensation grossly intact.  HEENT: Normocephalic, atraumatic  Skin: Warm and dry, no rashes. Cardiac: Regular rate and rhythm, no murmurs rubs or gallops, no lower extremity edema.  No carotid bruits. Respiratory: Clear to auscultation bilaterally. Not using accessory muscles, speaking in full sentences.   Impression and Recommendations:   Hypertension- we discussed options.  It is still a little elevated today it is very consistent with the numbers that he has been getting at home over the last week or 2.  We did discuss that he is actually gained 10 pounds since September which could be contributing he admits he has not been quite as active and has put on some weight.  He also uses salt liberally and agrees to cut back significantly.  We will keep an eye on this and plan to recheck his pressure in 4 to 6 weeks.  He will also monitor it carefully at home.  We will also increase his lisinopril to 20 mg and if his blood pressure start to decrease then we can always go back down to 10 mg.  No sign of volume overload on exam I did go over his labs with him and reassured him about the normal results as well.  COPD severe- CT angio confirming centrilobular emphysematous change insistent with severe emphysema..  With subpleural groundglass atelectasis.  No evidence of pleural effusion.  Continue current regimen.  He also  follows with pulmonary.

## 2018-07-21 ENCOUNTER — Other Ambulatory Visit: Payer: Self-pay | Admitting: Family Medicine

## 2018-08-30 ENCOUNTER — Ambulatory Visit (INDEPENDENT_AMBULATORY_CARE_PROVIDER_SITE_OTHER): Payer: Medicare Other | Admitting: Family Medicine

## 2018-08-30 ENCOUNTER — Encounter: Payer: Self-pay | Admitting: Family Medicine

## 2018-08-30 VITALS — BP 134/70 | HR 56 | Ht 69.0 in | Wt 173.0 lb

## 2018-08-30 DIAGNOSIS — I10 Essential (primary) hypertension: Secondary | ICD-10-CM | POA: Diagnosis not present

## 2018-08-30 DIAGNOSIS — J432 Centrilobular emphysema: Secondary | ICD-10-CM

## 2018-08-30 NOTE — Progress Notes (Signed)
Pt reports that his blood pressures have been really good. Maryruth Eve, Lahoma Crocker, CMA

## 2018-08-30 NOTE — Progress Notes (Signed)
Virtual Visit via Video Note  I connected with Tauren Delbuono on 08/30/18 at  8:50 AM EDT by a video enabled telemedicine application and verified that I am speaking with the correct person using two identifiers.   I discussed the limitations of evaluation and management by telemedicine and the availability of in person appointments. The patient expressed understanding and agreed to proceed.  Patient was at home and I was in my home office for the visit.   Subjective:    CC: F/u BP  HPI: 6-week follow-up for blood pressure.  When I last saw him I was seeing him for follow-up COPD exacerbation he was feeling much better in that regard but had noticed that his blood pressures had been up and down where his previously then been extremely consistent.  We did note that he had gained 10 pounds in the last year and discussed working on diet and exercise including really cutting back on salt which he has been using liberally we did also discuss going up on his lisinopril to 20 mg as well. He has lost about 3 lbs and has really cut back on his salt. He is golfing 2-3 per week but trying to be careful.  The highest is 142. Mostly in the 130s.    COPD- seems to be much better overal all. Still using his inhalers regularly. Some does are better thatn other.     Past medical history, Surgical history, Family history not pertinant except as noted below, Social history, Allergies, and medications have been entered into the medical record, reviewed, and corrections made.   Review of Systems: No fevers, chills, night sweats, weight loss, chest pain, or shortness of breath.   Objective:    General: Speaking clearly in complete sentences without any shortness of breath.  Alert and oriented x3.  Normal judgment. No apparent acute distress.  Well groomed.     Impression and Recommendations:    HTN - Bp looks much better today. Keep salt down. BPs back down in the 130s.   COPD - Stable. He is back  to baseline.       I discussed the assessment and treatment plan with the patient. The patient was provided an opportunity to ask questions and all were answered. The patient agreed with the plan and demonstrated an understanding of the instructions.   The patient was advised to call back or seek an in-person evaluation if the symptoms worsen or if the condition fails to improve as anticipated.   Beatrice Lecher, MD

## 2018-09-18 ENCOUNTER — Encounter: Payer: Self-pay | Admitting: Family Medicine

## 2018-09-28 ENCOUNTER — Other Ambulatory Visit: Payer: Self-pay | Admitting: Family Medicine

## 2018-11-16 DIAGNOSIS — Z87891 Personal history of nicotine dependence: Secondary | ICD-10-CM | POA: Diagnosis not present

## 2018-11-16 DIAGNOSIS — J449 Chronic obstructive pulmonary disease, unspecified: Secondary | ICD-10-CM | POA: Diagnosis not present

## 2018-12-04 DIAGNOSIS — R0600 Dyspnea, unspecified: Secondary | ICD-10-CM | POA: Diagnosis not present

## 2018-12-04 DIAGNOSIS — J449 Chronic obstructive pulmonary disease, unspecified: Secondary | ICD-10-CM | POA: Diagnosis not present

## 2018-12-04 DIAGNOSIS — Z87891 Personal history of nicotine dependence: Secondary | ICD-10-CM | POA: Diagnosis not present

## 2019-01-02 ENCOUNTER — Encounter: Payer: Self-pay | Admitting: Family Medicine

## 2019-01-16 ENCOUNTER — Other Ambulatory Visit: Payer: Self-pay | Admitting: Family Medicine

## 2019-02-06 ENCOUNTER — Encounter: Payer: Self-pay | Admitting: Family Medicine

## 2019-02-06 NOTE — Progress Notes (Signed)
Subjective:   Zachary Waters is a 78 y.o. male who presents for Medicare Annual/Subsequent preventive examination.  Review of Systems:  No ROS.  Medicare Wellness Virtual Visit.  Visual/audio telehealth visit, UTA vital signs.   See social history for additional risk factors.    Cardiac Risk Factors include: advanced age (>57men, >34 women);hypertension;male gender;dyslipidemia  Sleep patterns: Getting 8 hours of sleep a night. Wakes up 1 time to void during the night. Wakes up feels refreshed and ready for the day.   Home Safety/Smoke Alarms: Feels safe in home. Smoke alarms in place.  Living environment;Lives with wife in a 1 story home. Steps have handrails on them. Shower step over combo and no grab bars in there. Wanting to redo bathroom with walkin shower and grab bars. Seat Belt Safety/Bike Helmet: Wears seat belt.    Male:   CCS- UTD    PSA-  UTD Lab Results  Component Value Date   PSA 1.0 01/30/2018   PSA 1.14 01/13/2015   PSA 1.18 12/31/2013        Objective:    Vitals: There were no vitals taken for this visit.  There is no height or weight on file to calculate BMI.  Advanced Directives 02/12/2019 02/06/2018 02/24/2017 04/21/2016 01/13/2015 01/09/2014 08/06/2013  Does Patient Have a Medical Advance Directive? Yes Yes No Yes Yes Yes Patient has advance directive, copy not in chart  Type of Advance Directive [place];Living will Living will;Healthcare Power of [place];Living will [place];Living will [place];Living will [place];Living will  Does patient want to make changes to medical advance directive? No - Patient declined - - - No - Patient declined No - Patient declined -  Copy of [place] in Chart? No - copy requested No - copy requested - No - copy requested No - copy requested No - copy requested Copy requested from family     Tobacco Social History   Tobacco Use  Smoking Status Former Smoker  . Types: Pipe  . Quit date: 10/07/2009  . Years since quitting: 9.3  Smokeless Tobacco Never Used     Counseling given: Not Answered   Clinical Intake:  Pre-visit preparation completed: Yes  Pain : No/denies pain     Nutritional Risks: None Diabetes: No  What is the last grade level you completed in school?: 14  Interpreter Needed?: No  Information entered by :: Orlie Dakin, LPN  Past Medical History:  Diagnosis Date  . Cervical spondylosis with radiculopathy 03/08/2012  . COPD (chronic obstructive pulmonary disease) ([place])   . Dyspnea   . Essential hypertension   . GERD (gastroesophageal reflux disease) once in a great while  . Hyperlipidemia 02/02/2018  . Pneumonia 10/2009  . Thyroid disease    Past Surgical History:  Procedure Laterality Date  . COLONOSCOPY    . HERNIA REPAIR     inguinal hernia repair on right and left  . INGUINAL HERNIA REPAIR Bilateral 04/21/2016   Procedure: LAPAROSCOPIC LEFT INGUINAL HERNIA REPAIR AND RIGHT FEMORAL HERNIA REPAIR;  Surgeon: Michael Boston, MD;  Location: [place];  Service: General;  Laterality: Bilateral;  . INSERTION OF MESH Bilateral 04/21/2016   Procedure: INSERTION OF MESH LEFT INGUINAL HERNIA  AND INSERTION OF MESH RIGHT FEMORAL HERNIA;  Surgeon: Michael Boston, MD;  Location: [place];  Service: General;  Laterality: Bilateral;  . LEG SURGERY Right   . ROTATOR CUFF REPAIR  2005   Left  . TONSILLECTOMY     Family History  Problem Relation Age of Onset  . Prostate cancer Father 62  . Heart disease Mother 44       deceased age 40  . Depression Brother        Commited suicide  . Depression Brother    Social History   Socioeconomic History  . Marital status: Married    Spouse name: Zachary Waters  . Number of children: 2  . Years of education: 70  . Highest education level: Associate degree: academic program  Occupational History  . Occupation: retired     Comment: Producer, television/film/video  Social Needs  . Financial resource strain: Not hard at all  . Food insecurity    Worry: Never true    Inability: Never true  . Transportation needs    Medical: No    Non-medical: No  Tobacco Use  . Smoking status: Former Smoker    Types: Pipe    Quit date: 10/07/2009    Years since quitting: 9.3  . Smokeless tobacco: Never Used  Substance and Sexual Activity  . Alcohol use: Yes    Alcohol/week: 1.0 standard drinks    Types: 1 Glasses of wine per week    Comment: occasional  . Drug use: No  . Sexual activity: Never  Lifestyle  . Physical activity    Days per week: 2 days    Minutes per session: 60 min  . Stress: Not at all  Relationships  . Social Herbalist on phone: Twice a week    Gets together: Twice a week    Attends religious service: More than 4 times per year    Active member of club or organization: Yes    Attends meetings of clubs or organizations: More than 4 times per year    Relationship status: Married  Other Topics Concern  . Not on file  Social History Narrative   Power walks 3 days per week for 2 miles. Golfs twice a week.     Outpatient Encounter Medications as of 02/12/2019  Medication Sig  . albuterol (PROAIR HFA) 108 (90 BASE) MCG/ACT inhaler Inhale 2 puffs into the lungs every 6 (six) hours as needed.    . budesonide-formoterol (SYMBICORT) 160-4.5 MCG/ACT inhaler Inhale 2 puffs into the lungs 2 (two) times daily.  Marland Kitchen levothyroxine (SYNTHROID) 112 MCG tablet TAKE 1 TABLET (112 MCG TOTAL) BY MOUTH DAILY BEFORE BREAKFAST.  Marland Kitchen lisinopril (ZESTRIL) 20 MG tablet TAKE 1 TABLET BY MOUTH EVERY DAY  . Omega-3 Fatty Acids (FISH OIL) 1000 MG CAPS Take 1 capsule by mouth daily.  Marland Kitchen QUERCETIN PO Take 2 tablets by mouth daily. Pt reports taking 1 tablet daily  . tiotropium (SPIRIVA HANDIHALER) 18 MCG inhalation capsule Place 1 capsule (18 mcg total) into inhaler and inhale daily. (Patient taking differently: Place 18 mcg into  inhaler and inhale 2 (two) times daily. )  . [DISCONTINUED] lisinopril (PRINIVIL,ZESTRIL) 20 MG tablet Take 1 tablet (20 mg total) by mouth daily.   No facility-administered encounter medications on file as of 02/12/2019.     Activities of Daily Living In your present state of health, do you have any difficulty performing the following activities: 02/12/2019  Hearing? Y  Comment has noticed hearing loss- has hearing aids in both ears  Vision? N  Difficulty concentrating or making decisions? N  Walking or climbing stairs? Y  Comment because of COPD  Dressing or bathing? N  Doing  errands, shopping? N  Preparing Food and eating ? N  Using the Toilet? N  In the past six months, have you accidently leaked urine? N  Do you have problems with loss of bowel control? N  Managing your Medications? N  Managing your Finances? N  Housekeeping or managing your Housekeeping? N  Some recent data might be hidden    Patient Care Team: Hali Marry, MD as PCP - General (Family Medicine) Marcy Panning (Pulmonary Disease) Milas Kocher (Chiropractic Medicine) Michael Boston, MD as Consulting Physician (General Surgery)   Assessment:   This is a routine wellness examination for [facility].Physical assessment deferred to PCP.   Exercise Activities and Dietary recommendations Current Exercise Habits: The patient does not participate in regular exercise at present Diet Eats a healthy diet of frutis vegetables and proteins in. Breakfast: cereal english muffin and coffee Lunch: skips or a ham sandwich Dinner: Meat and vegetables      Goals    . Patient Stated     Start Blood pressure medication. Monitor a few times a week    . Weight (lb) < 200 lb (90.7 kg)     Would like to loose 10 lbs       Fall Risk Fall Risk  02/12/2019 02/06/2018 08/10/2017 02/02/2017 02/03/2016  Falls in the past year? 0 No No No No  Number falls in past yr: 0 - - - -  Injury with Fall? 0 - - - -  Follow up Falls  prevention discussed - - - -   Is the patient's home free of loose throw rugs in walkways, pet beds, electrical cords, etc?   yes      Grab bars in the bathroom? no      Handrails on the stairs?   yes      Adequate lighting?   yes   Depression Screen PHQ 2/9 Scores 02/12/2019 08/30/2018 02/06/2018 08/10/2017  PHQ - 2 Score 0 0 0 0    Cognitive Function     6CIT Screen 02/12/2019 02/06/2018 02/02/2017 02/03/2016  What Year? 0 points 0 points 0 points 0 points  What month? 0 points 0 points 0 points 0 points  What time? 0 points 0 points 0 points 0 points  Count back from 20 0 points 0 points 0 points 0 points  Months in reverse 0 points 0 points 0 points 0 points  Repeat phrase 0 points 0 points 2 points 2 points  Total Score 0 0 2 2    Immunization History  Administered Date(s) Administered  . Influenza, High Dose Seasonal PF 02/02/2017, 02/06/2018  . Influenza,inj,Quad PF,6+ Mos 02/12/2013, 01/09/2014, 01/13/2015, 02/07/2016  . Pneumococcal Conjugate-13 08/06/2013  . Pneumococcal Polysaccharide-23 05/09/2010  . Tdap 01/03/2012  . Zoster 10/27/2010    Screening Tests Health Maintenance  Topic Date Due  . INFLUENZA VACCINE  12/08/2018  . COLONOSCOPY  06/15/2019  . TETANUS/TDAP  01/02/2022  . PNA vac Low Risk Adult  Completed        Plan:    Mr. Kottman , Thank you for taking time to come for your Medicare Wellness Visit. I appreciate your ongoing commitment to your health goals. Please review the following plan we discussed and let me know if I can assist you in the future.  Please schedule your next medicare wellness visit with me in 1 yr.   These are the goals we discussed: Goals    . Patient Stated     Start Blood pressure medication. Monitor  a few times a week    . Weight (lb) < 200 lb (90.7 kg)     Would like to loose 10 lbs       This is a list of the screening recommended for you and due dates:  Health Maintenance  Topic Date Due  . Flu Shot  12/08/2018   . Colon Cancer Screening  06/15/2019  . Tetanus Vaccine  01/02/2022  . Pneumonia vaccines  Completed   Please schedule your next medicare wellness visit with me in 1 yr.   I have personally reviewed and noted the following in the patient's chart:   . Medical and social history . Use of alcohol, tobacco or illicit drugs  . Current medications and supplements . Functional ability and status . Nutritional status . Physical activity . Advanced directives . List of other physicians . Hospitalizations, surgeries, and ER visits in previous 12 months . Vitals . Screenings to include cognitive, depression, and falls . Referrals and appointments  In addition, I have reviewed and discussed with patient certain preventive protocols, quality metrics, and best practice recommendations. A written personalized care plan for preventive services as well as general preventive health recommendations were provided to patient.     Joanne Chars, LPN  [id number]

## 2019-02-11 ENCOUNTER — Other Ambulatory Visit: Payer: Self-pay | Admitting: Family Medicine

## 2019-02-12 ENCOUNTER — Ambulatory Visit (INDEPENDENT_AMBULATORY_CARE_PROVIDER_SITE_OTHER): Payer: Medicare Other | Admitting: *Deleted

## 2019-02-12 VITALS — BP 122/76 | HR 60 | Ht 69.0 in | Wt 173.0 lb

## 2019-02-12 DIAGNOSIS — Z0000 Encounter for general adult medical examination without abnormal findings: Secondary | ICD-10-CM | POA: Diagnosis not present

## 2019-02-12 NOTE — Patient Instructions (Addendum)
Zachary Waters , Thank you for taking time to come for your Medicare Wellness Visit. I appreciate your ongoing commitment to your health goals. Please review the following plan we discussed and let me know if I can assist you in the future.  Please schedule your next medicare wellness visit with me in 1 yr.  These are the goals we discussed: Goals    . Patient Stated     Start Blood pressure medication. Monitor a few times a week    . Weight (lb) < 200 lb (90.7 kg)     Would like to loose 10 lbs

## 2019-02-16 ENCOUNTER — Encounter: Payer: Self-pay | Admitting: Family Medicine

## 2019-03-06 ENCOUNTER — Encounter: Payer: Self-pay | Admitting: *Deleted

## 2019-03-07 ENCOUNTER — Other Ambulatory Visit: Payer: Self-pay

## 2019-03-07 ENCOUNTER — Encounter: Payer: Self-pay | Admitting: Family Medicine

## 2019-03-07 ENCOUNTER — Ambulatory Visit (INDEPENDENT_AMBULATORY_CARE_PROVIDER_SITE_OTHER): Payer: Medicare Other | Admitting: Family Medicine

## 2019-03-07 VITALS — BP 138/78 | HR 66 | Ht 69.0 in | Wt 178.0 lb

## 2019-03-07 DIAGNOSIS — Z0000 Encounter for general adult medical examination without abnormal findings: Secondary | ICD-10-CM

## 2019-03-07 DIAGNOSIS — I10 Essential (primary) hypertension: Secondary | ICD-10-CM

## 2019-03-07 DIAGNOSIS — J441 Chronic obstructive pulmonary disease with (acute) exacerbation: Secondary | ICD-10-CM | POA: Diagnosis not present

## 2019-03-07 DIAGNOSIS — R292 Abnormal reflex: Secondary | ICD-10-CM | POA: Diagnosis not present

## 2019-03-07 NOTE — Patient Instructions (Signed)
Increase lisinopril to 2 tabs daily and check BP over those 2 weeks and let us know what numbers you are getting and we can decide if that is helping

## 2019-03-07 NOTE — Progress Notes (Signed)
Established Patient Office Visit - CPE  Subjective:  Patient ID: Zachary Waters, male    DOB: Mar 22, 1941  Age: 78 y.o. MRN: [id number]  CC:  Chief Complaint  Patient presents with  . Annual Exam    HPI Zachary Waters presents for wellness exam.  Overall he is actually doing well.  Recently had his Medicare wellness exam.  He still plays golf about 2 to 3 days/week he has not been exercising as much in between as he used to because the gym is closed down locally because of Covid and has not reopened.  In regards to his COPD he does feel like he is getting some decline in his lung function.  He reports that he uses his Symbicort and Spiriva consistently.  He has not noticed any weakness in his extremities.  He does report taking a baby aspirin daily in addition to his regular regimen.  He did have his flu vaccine at the [place] this year and we have documented that.  Past Medical History:  Diagnosis Date  . Cervical spondylosis with radiculopathy 03/08/2012  . COPD (chronic obstructive pulmonary disease) ([place])   . Dyspnea   . Essential hypertension   . GERD (gastroesophageal reflux disease) once in a great while  . Hyperlipidemia 02/02/2018  . Pneumonia 10/2009  . Thyroid disease     Past Surgical History:  Procedure Laterality Date  . COLONOSCOPY    . HERNIA REPAIR     inguinal hernia repair on right and left  . INGUINAL HERNIA REPAIR Bilateral 04/21/2016   Procedure: LAPAROSCOPIC LEFT INGUINAL HERNIA REPAIR AND RIGHT FEMORAL HERNIA REPAIR;  Surgeon: Michael Boston, MD;  Location: [place];  Service: General;  Laterality: Bilateral;  . INSERTION OF MESH Bilateral 04/21/2016   Procedure: INSERTION OF MESH LEFT INGUINAL HERNIA  AND INSERTION OF MESH RIGHT FEMORAL HERNIA;  Surgeon: Michael Boston, MD;  Location: [place];  Service: General;  Laterality: Bilateral;  . LEG SURGERY Right    tumor removed as a child from femur?  . ROTATOR CUFF REPAIR  2005   Left  . TONSILLECTOMY       Family History  Problem Relation Age of Onset  . Prostate cancer Father 37  . Heart disease Mother 58       deceased age 67  . Depression Brother        Commited suicide  . Depression Brother     Social History   Socioeconomic History  . Marital status: Married    Spouse name: [name]  . Number of children: 2  . Years of education: 25  . Highest education level: Associate degree: academic program  Occupational History  . Occupation: retired    Comment: Producer, television/film/video  Social Needs  . Financial resource strain: Not hard at all  . Food insecurity    Worry: Never true    Inability: Never true  . Transportation needs    Medical: No    Non-medical: No  Tobacco Use  . Smoking status: Former Smoker    Types: Pipe    Quit date: 10/07/2009    Years since quitting: 9.4  . Smokeless tobacco: Never Used  Substance and Sexual Activity  . Alcohol use: Yes    Alcohol/week: 1.0 standard drinks    Types: 1 Glasses of wine per week    Comment: occasional  . Drug use: No  . Sexual activity: Never  Lifestyle  . Physical activity    Days per week: 2 days  Minutes per session: 60 min  . Stress: Not at all  Relationships  . Social Herbalist on phone: Twice a week    Gets together: Twice a week    Attends religious service: More than 4 times per year    Active member of club or organization: Yes    Attends meetings of clubs or organizations: More than 4 times per year    Relationship status: Married  . Intimate partner violence    Fear of current or ex partner: No    Emotionally abused: No    Physically abused: No    Forced sexual activity: No  Other Topics Concern  . Not on file  Social History Narrative   Power walks 3 days per week for 2 miles. Golfs twice a week.     Outpatient Medications Prior to Visit  Medication Sig Dispense Refill  . albuterol (PROAIR HFA) 108 (90 BASE) MCG/ACT inhaler Inhale 2 puffs into the lungs every 6 (six) hours as needed.       Marland Kitchen aspirin EC 81 MG tablet Take 81 mg by mouth daily.    . budesonide-formoterol (SYMBICORT) 160-4.5 MCG/ACT inhaler Inhale 2 puffs into the lungs 2 (two) times daily.    Marland Kitchen levothyroxine (SYNTHROID) 112 MCG tablet TAKE 1 TABLET (112 MCG TOTAL) BY MOUTH DAILY BEFORE BREAKFAST. 90 tablet 1  . lisinopril (ZESTRIL) 20 MG tablet TAKE 1 TABLET BY MOUTH EVERY DAY 90 tablet 1  . Omega-3 Fatty Acids (FISH OIL) 1000 MG CAPS Take 1 capsule by mouth daily.    Marland Kitchen QUERCETIN PO Take 2 tablets by mouth daily. Pt reports taking 1 tablet daily    . tiotropium (SPIRIVA HANDIHALER) 18 MCG inhalation capsule Place 1 capsule (18 mcg total) into inhaler and inhale daily. (Patient taking differently: Place 18 mcg into inhaler and inhale 2 (two) times daily. ) 1 capsule 0   No facility-administered medications prior to visit.     Allergies  Allergen Reactions  . No Known Allergies     ROS Review of Systems    Objective:    Physical Exam  Constitutional: He is oriented to person, place, and time. He appears well-developed and well-nourished.  HENT:  Head: Normocephalic and atraumatic.  Right Ear: External ear normal.  Left Ear: External ear normal.  Nose: Nose normal.  Mouth/Throat: Oropharynx is clear and moist.  Eyes: Pupils are equal, round, and reactive to light. Conjunctivae and EOM are normal.  Neck: Normal range of motion. Neck supple. No thyromegaly present.  Cardiovascular: Normal rate, regular rhythm, normal heart sounds and intact distal pulses.  Pulmonary/Chest: Effort normal and breath sounds normal.  Abdominal: Soft. Bowel sounds are normal. He exhibits no distension and no mass. There is no abdominal tenderness. There is no rebound and no guarding.  Musculoskeletal: Normal range of motion.     Comments: He is able to fully extend his knees and stand on toes without difficulty.   Lymphadenopathy:    He has no cervical adenopathy.  Neurological: He is alert and oriented to person, place,  and time.  DTR in upper extremities are symmetric and 1+.  DTR and left patellar is 2+ in the left and 1+ on the right.  There is a significant difference between the 2.  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  Skin: Skin is warm and dry.  Psychiatric: He has a normal mood and affect. His behavior  is normal. Judgment and thought content normal.    BP 138/78   Pulse 66   Ht 5\' 9"  (1.753 m)   Wt 178 lb (80.7 kg)   SpO2 95%   BMI 26.29 kg/m  Wt Readings from Last 3 Encounters:  03/07/19 178 lb (80.7 kg)  02/12/19 173 lb (78.5 kg)  08/30/18 173 lb (78.5 kg)     There are no preventive care reminders to display for this patient.  There are no preventive care reminders to display for this patient.  Lab Results  Component Value Date   TSH 1.13 07/05/2018   Lab Results  Component Value Date   WBC 4.7 07/05/2018   HGB 14.5 07/05/2018   HCT 43.3 07/05/2018   MCV 93.3 07/05/2018   PLT 234 07/05/2018   Lab Results  Component Value Date   NA 141 07/05/2018   K 4.9 07/05/2018   CO2 27 07/05/2018   GLUCOSE 75 07/05/2018   BUN 16 07/05/2018   CREATININE 1.04 07/05/2018   BILITOT 0.4 07/05/2018   ALKPHOS 66 12/28/2015   AST 16 07/05/2018   ALT 14 07/05/2018   PROT 6.2 07/05/2018   ALBUMIN 4.0 12/22/2014   CALCIUM 9.1 07/05/2018   ANIONGAP 8 04/21/2016   Lab Results  Component Value Date   CHOL 191 01/30/2018   Lab Results  Component Value Date   HDL 62 01/30/2018   Lab Results  Component Value Date   LDLCALC 115 (H) 01/30/2018   Lab Results  Component Value Date   TRIG 46 01/30/2018   Lab Results  Component Value Date   CHOLHDL 3.1 01/30/2018   Lab Results  Component Value Date   HGBA1C 5.7 (H) 01/30/2018      Assessment & Plan:   Problem List Items Addressed This Visit      Cardiovascular and Mediastinum   Essential hypertension   Relevant Medications   aspirin EC 81 MG tablet   Other Relevant Orders   PSA    Lipid Panel w/reflex Direct LDL   TSH   CBC     Respiratory   Chronic obstructive pulmonary disease (HCC)   Relevant Orders   PSA   Lipid Panel w/reflex Direct LDL   TSH   CBC     Other   Asymmetrical deep tendon reflexes    Other Visit Diagnoses    Wellness examination    -  Primary     Keep up a regular exercise program and make sure you are eating a healthy diet Try to eat 4 servings of dairy a day, or if you are lactose intolerant take a calcium with vitamin D daily.  Your vaccines are up to date.  Chart has been updated for flu vaccine.   No orders of the defined types were placed in this encounter.   Follow-up: Return in about 6 months (around 09/05/2019) for Hypertension.    Beatrice Lecher, MD

## 2019-03-08 DIAGNOSIS — J441 Chronic obstructive pulmonary disease with (acute) exacerbation: Secondary | ICD-10-CM | POA: Diagnosis not present

## 2019-03-08 DIAGNOSIS — I10 Essential (primary) hypertension: Secondary | ICD-10-CM | POA: Diagnosis not present

## 2019-03-09 LAB — CBC
HCT: 45.9 % (ref 38.5–50.0)
Hemoglobin: 15.2 g/dL (ref 13.2–17.1)
MCH: 30.9 pg (ref 27.0–33.0)
MCHC: 33.1 g/dL (ref 32.0–36.0)
MCV: 93.3 fL (ref 80.0–100.0)
MPV: 10.6 fL (ref 7.5–12.5)
Platelets: 246 10*3/uL (ref 140–400)
RBC: 4.92 10*6/uL (ref 4.20–5.80)
RDW: 12.4 % (ref 11.0–15.0)
WBC: 5.1 10*3/uL (ref 3.8–10.8)

## 2019-03-09 LAB — LIPID PANEL W/REFLEX DIRECT LDL
Cholesterol: 192 mg/dL (ref <200)
HDL: 57 mg/dL (ref >=40)
LDL Cholesterol (Calc): 119 mg/dL (calc) — ABNORMAL HIGH
Non-HDL Cholesterol (Calc): 135 mg/dL (calc) — ABNORMAL HIGH (ref <130)
Total CHOL/HDL Ratio: 3.4 (calc) (ref <5.0)
Triglycerides: 69 mg/dL (ref <150)

## 2019-03-09 LAB — PSA: PSA: 1.1 ng/mL (ref <=4.0)

## 2019-03-09 LAB — TSH: TSH: 1.09 mIU/L (ref 0.40–4.50)

## 2019-04-22 DIAGNOSIS — R0600 Dyspnea, unspecified: Secondary | ICD-10-CM | POA: Diagnosis not present

## 2019-04-22 DIAGNOSIS — J449 Chronic obstructive pulmonary disease, unspecified: Secondary | ICD-10-CM | POA: Diagnosis not present

## 2019-04-22 DIAGNOSIS — Z87891 Personal history of nicotine dependence: Secondary | ICD-10-CM | POA: Diagnosis not present

## 2019-05-15 DIAGNOSIS — J479 Bronchiectasis, uncomplicated: Secondary | ICD-10-CM | POA: Diagnosis not present

## 2019-05-15 DIAGNOSIS — J449 Chronic obstructive pulmonary disease, unspecified: Secondary | ICD-10-CM | POA: Diagnosis not present

## 2019-05-15 DIAGNOSIS — J438 Other emphysema: Secondary | ICD-10-CM | POA: Diagnosis not present

## 2019-05-15 DIAGNOSIS — Z87891 Personal history of nicotine dependence: Secondary | ICD-10-CM | POA: Diagnosis not present

## 2019-05-15 DIAGNOSIS — Z79899 Other long term (current) drug therapy: Secondary | ICD-10-CM | POA: Diagnosis not present

## 2019-05-15 DIAGNOSIS — Z7952 Long term (current) use of systemic steroids: Secondary | ICD-10-CM | POA: Diagnosis not present

## 2019-05-15 DIAGNOSIS — Z23 Encounter for immunization: Secondary | ICD-10-CM | POA: Diagnosis not present

## 2019-05-23 DIAGNOSIS — L821 Other seborrheic keratosis: Secondary | ICD-10-CM | POA: Diagnosis not present

## 2019-05-23 DIAGNOSIS — L570 Actinic keratosis: Secondary | ICD-10-CM | POA: Diagnosis not present

## 2019-05-23 DIAGNOSIS — C44319 Basal cell carcinoma of skin of other parts of face: Secondary | ICD-10-CM | POA: Diagnosis not present

## 2019-05-23 DIAGNOSIS — L578 Other skin changes due to chronic exposure to nonionizing radiation: Secondary | ICD-10-CM | POA: Diagnosis not present

## 2019-05-23 DIAGNOSIS — D485 Neoplasm of uncertain behavior of skin: Secondary | ICD-10-CM | POA: Diagnosis not present

## 2019-06-04 DIAGNOSIS — J449 Chronic obstructive pulmonary disease, unspecified: Secondary | ICD-10-CM | POA: Diagnosis not present

## 2019-07-13 ENCOUNTER — Other Ambulatory Visit: Payer: Self-pay | Admitting: Family Medicine

## 2019-07-17 DIAGNOSIS — K635 Polyp of colon: Secondary | ICD-10-CM | POA: Diagnosis not present

## 2019-07-17 DIAGNOSIS — D124 Benign neoplasm of descending colon: Secondary | ICD-10-CM | POA: Diagnosis not present

## 2019-07-17 DIAGNOSIS — Z86010 Personal history of colonic polyps: Secondary | ICD-10-CM | POA: Diagnosis not present

## 2019-07-17 DIAGNOSIS — D122 Benign neoplasm of ascending colon: Secondary | ICD-10-CM | POA: Diagnosis not present

## 2019-07-17 DIAGNOSIS — K5730 Diverticulosis of large intestine without perforation or abscess without bleeding: Secondary | ICD-10-CM | POA: Diagnosis not present

## 2019-07-17 LAB — HM COLONOSCOPY

## 2019-07-23 DIAGNOSIS — C44319 Basal cell carcinoma of skin of other parts of face: Secondary | ICD-10-CM | POA: Diagnosis not present

## 2019-07-25 ENCOUNTER — Other Ambulatory Visit: Payer: Self-pay | Admitting: Family Medicine

## 2019-07-25 ENCOUNTER — Encounter: Payer: Self-pay | Admitting: Family Medicine

## 2019-08-14 DIAGNOSIS — J449 Chronic obstructive pulmonary disease, unspecified: Secondary | ICD-10-CM | POA: Diagnosis not present

## 2019-08-14 DIAGNOSIS — R918 Other nonspecific abnormal finding of lung field: Secondary | ICD-10-CM | POA: Diagnosis not present

## 2019-08-14 DIAGNOSIS — R0602 Shortness of breath: Secondary | ICD-10-CM | POA: Diagnosis not present

## 2019-08-14 DIAGNOSIS — J432 Centrilobular emphysema: Secondary | ICD-10-CM | POA: Diagnosis not present

## 2019-08-14 DIAGNOSIS — Z87891 Personal history of nicotine dependence: Secondary | ICD-10-CM | POA: Diagnosis not present

## 2019-09-05 ENCOUNTER — Encounter: Payer: Self-pay | Admitting: Family Medicine

## 2019-09-05 ENCOUNTER — Ambulatory Visit (INDEPENDENT_AMBULATORY_CARE_PROVIDER_SITE_OTHER): Payer: Medicare Other | Admitting: Family Medicine

## 2019-09-05 VITALS — BP 138/88 | HR 61 | Ht 69.0 in | Wt 170.0 lb

## 2019-09-05 DIAGNOSIS — I10 Essential (primary) hypertension: Secondary | ICD-10-CM | POA: Diagnosis not present

## 2019-09-05 DIAGNOSIS — E038 Other specified hypothyroidism: Secondary | ICD-10-CM

## 2019-09-05 DIAGNOSIS — R7301 Impaired fasting glucose: Secondary | ICD-10-CM

## 2019-09-05 DIAGNOSIS — J432 Centrilobular emphysema: Secondary | ICD-10-CM

## 2019-09-05 DIAGNOSIS — J441 Chronic obstructive pulmonary disease with (acute) exacerbation: Secondary | ICD-10-CM | POA: Diagnosis not present

## 2019-09-05 LAB — POCT GLYCOSYLATED HEMOGLOBIN (HGB A1C): Hemoglobin A1C: 5.6 % (ref 4.0–5.6)

## 2019-09-05 NOTE — Assessment & Plan Note (Signed)
PFTs on 08/14/2019 at [facility]: FVC of 83% FEV1 of 34% with a ratio of 31.  DLCO 37% please see scanned document for full details.  Look into more information about this after.  He understands the potential complications and seriousness of the surgery but he is considering it.  He really would like to have improvement in his quality of life, without having to go on oxygen.

## 2019-09-05 NOTE — Assessment & Plan Note (Signed)
Due to recheck TSH. Doing well on current regimen.

## 2019-09-05 NOTE — Progress Notes (Signed)
Established Patient Office Visit  Subjective:  Patient ID: Zachary Waters, male    DOB: 04/24/1941  Age: 79 y.o. MRN: [id number]  CC:  Chief Complaint  Patient presents with  . Hypertension  . ifg    HPI [name] presents for   Hypertension- Pt denies chest pain, SOB, dizziness, or heart palpitations.  Taking meds as directed w/o problems.  Denies medication side effects.    Impaired fasting glucose-no increased thirst or urination. No symptoms consistent with hypoglycemia.  F/U COPD - he is on symbicort and Spiriva.  Recently diagnosed with pulmonary nodule.  They are planning on a repeat CT scan in about 3 months to follow that up.  Follows with Dr. Michela Pitcher and also had a consultation at [facility].  He has had his Covid vaccine through the [place]. he is also considering a new device called the Zephyr which are implanted valves into the lungs in fact that is why he has had a consultation at [facility].  He is considering it but understands that it can be quite risky he did repeat a pulmonary function test with them and brought a copy of that in for [place] to have today  Hypothyroidism - Taking medication regularly in the AM away from food and vitamins, etc. No recent change to skin, hair, or energy levels.    Past Medical History:  Diagnosis Date  . Cervical spondylosis with radiculopathy 03/08/2012  . COPD (chronic obstructive pulmonary disease) ([place])   . Dyspnea   . Essential hypertension   . GERD (gastroesophageal reflux disease) once in a great while  . Hyperlipidemia 02/02/2018  . Pneumonia 10/2009  . Thyroid disease     Past Surgical History:  Procedure Laterality Date  . COLONOSCOPY    . HERNIA REPAIR     inguinal hernia repair on right and left  . INGUINAL HERNIA REPAIR Bilateral 04/21/2016   Procedure: LAPAROSCOPIC LEFT INGUINAL HERNIA REPAIR AND RIGHT FEMORAL HERNIA REPAIR;  Surgeon: Michael Boston, MD;  Location: [place];  Service: General;  Laterality: Bilateral;  .  INSERTION OF MESH Bilateral 04/21/2016   Procedure: INSERTION OF MESH LEFT INGUINAL HERNIA  AND INSERTION OF MESH RIGHT FEMORAL HERNIA;  Surgeon: Michael Boston, MD;  Location: [place];  Service: General;  Laterality: Bilateral;  . LEG SURGERY Right    tumor removed as a child from femur?  . ROTATOR CUFF REPAIR  2005   Left  . TONSILLECTOMY      Family History  Problem Relation Age of Onset  . Prostate cancer Father 42  . Heart disease Mother 49       deceased age 77  . Depression Brother        Commited suicide  . Depression Brother     Social History   Socioeconomic History  . Marital status: Married    Spouse name: [name]  . Number of children: 2  . Years of education: 82  . Highest education level: Associate degree: academic program  Occupational History  . Occupation: retired    Comment: Producer, television/film/video  Tobacco Use  . Smoking status: Former Smoker    Types: Pipe    Quit date: 10/07/2009    Years since quitting: 9.9  . Smokeless tobacco: Never Used  Substance and Sexual Activity  . Alcohol use: Yes    Alcohol/week: 1.0 standard drinks    Types: 1 Glasses of wine per week    Comment: occasional  . Drug use: No  . Sexual  activity: Never  Other Topics Concern  . Not on file  Social History Narrative   Power walks 3 days per week for 2 miles. Golfs twice a week.    Social Determinants of Health   Financial Resource Strain: Low Risk   . Difficulty of Paying Living Expenses: Not hard at all  Food Insecurity: No Food Insecurity  . Worried About Charity fundraiser in the Last Year: Never true  . Ran Out of Food in the Last Year: Never true  Transportation Needs: No Transportation Needs  . Lack of Transportation (Medical): No  . Lack of Transportation (Non-Medical): No  Physical Activity: Insufficiently Active  . Days of Exercise per Week: 2 days  . Minutes of Exercise per Session: 60 min  Stress: No Stress Concern Present  . Feeling of Stress : Not at all  Social  Connections: Not Isolated  . Frequency of Communication with Friends and Family: Twice a week  . Frequency of Social Gatherings with Friends and Family: Twice a week  . Attends Religious Services: More than 4 times per year  . Active Member of Clubs or Organizations: Yes  . Attends Archivist Meetings: More than 4 times per year  . Marital Status: Married  Human resources officer Violence: Not At Risk  . Fear of Current or Ex-Partner: No  . Emotionally Abused: No  . Physically Abused: No  . Sexually Abused: No    Outpatient Medications Prior to Visit  Medication Sig Dispense Refill  . albuterol (PROAIR HFA) 108 (90 BASE) MCG/ACT inhaler Inhale 2 puffs into the lungs every 6 (six) hours as needed.      Marland Kitchen aspirin EC 81 MG tablet Take 81 mg by mouth daily.    . budesonide-formoterol (SYMBICORT) 160-4.5 MCG/ACT inhaler Inhale 2 puffs into the lungs 2 (two) times daily.    Marland Kitchen levothyroxine (SYNTHROID) 112 MCG tablet TAKE 1 TABLET (112 MCG TOTAL) BY MOUTH DAILY BEFORE BREAKFAST. 90 tablet 1  . lisinopril (ZESTRIL) 20 MG tablet TAKE 1 TABLET BY MOUTH EVERY DAY 90 tablet 1  . Omega-3 Fatty Acids (FISH OIL) 1000 MG CAPS Take 1 capsule by mouth daily.    Marland Kitchen QUERCETIN PO Take 2 tablets by mouth daily. Pt reports taking 1 tablet daily    . tiotropium (SPIRIVA HANDIHALER) 18 MCG inhalation capsule Place 1 capsule (18 mcg total) into inhaler and inhale daily. (Patient taking differently: Place 18 mcg into inhaler and inhale 2 (two) times daily. ) 1 capsule 0  . Misc. Devices MISC Referral to [facility] for zephyr valve placement consideration     No facility-administered medications prior to visit.    Allergies  Allergen Reactions  . No Known Allergies     ROS Review of Systems    Objective:    Physical Exam  Constitutional: He is oriented to person, place, and time. He appears well-developed and well-nourished.  HENT:  Head: Normocephalic and atraumatic.  Neck: No thyromegaly  present.  Cardiovascular: Normal rate, regular rhythm and normal heart sounds.  Pulmonary/Chest: Effort normal and breath sounds normal.  Expiratory wheeze at the lung bases bilaterally.  Musculoskeletal:     Cervical back: Neck supple.  Neurological: He is alert and oriented to person, place, and time.  Skin: Skin is warm and dry.  Psychiatric: He has a normal mood and affect. His behavior is normal.    BP 138/88   Pulse 61   Ht 5\' 9"  (1.753 m)   Wt 170 lb (  77.1 kg)   SpO2 98%   BMI 25.10 kg/m  Wt Readings from Last 3 Encounters:  09/05/19 170 lb (77.1 kg)  03/07/19 178 lb (80.7 kg)  02/12/19 173 lb (78.5 kg)     There are no preventive care reminders to display for this patient.  There are no preventive care reminders to display for this patient.  Lab Results  Component Value Date   TSH 1.09 03/08/2019   Lab Results  Component Value Date   WBC 5.1 03/08/2019   HGB 15.2 03/08/2019   HCT 45.9 03/08/2019   MCV 93.3 03/08/2019   PLT 246 03/08/2019   Lab Results  Component Value Date   NA 141 07/05/2018   K 4.9 07/05/2018   CO2 27 07/05/2018   GLUCOSE 75 07/05/2018   BUN 16 07/05/2018   CREATININE 1.04 07/05/2018   BILITOT 0.4 07/05/2018   ALKPHOS 66 12/28/2015   AST 16 07/05/2018   ALT 14 07/05/2018   PROT 6.2 07/05/2018   ALBUMIN 4.0 12/22/2014   CALCIUM 9.1 07/05/2018   ANIONGAP 8 04/21/2016   Lab Results  Component Value Date   CHOL 192 03/08/2019   Lab Results  Component Value Date   HDL 57 03/08/2019   Lab Results  Component Value Date   LDLCALC 119 (H) 03/08/2019   Lab Results  Component Value Date   TRIG 69 03/08/2019   Lab Results  Component Value Date   CHOLHDL 3.4 03/08/2019   Lab Results  Component Value Date   HGBA1C 5.6 09/05/2019      Assessment & Plan:   Problem List Items Addressed This Visit      Cardiovascular and Mediastinum   Essential hypertension - Primary    Blood pressure is is okay today.  Reports getting  great numbers at home.  Continue current regimen has been able to maintain his weight which is great.  Follow-up in 6 months.        Respiratory   Chronic obstructive pulmonary disease (HCC)    PFTs on 08/14/2019 at [facility]: FVC of 83% FEV1 of 34% with a ratio of 31.  DLCO 37% please see scanned document for full details.  Look into more information about this after.  He understands the potential complications and seriousness of the surgery but he is considering it.  He really would like to have improvement in his quality of life, without having to go on oxygen.      Centrilobular emphysema (HCC)     Endocrine   IFG (impaired fasting glucose)    A1c looks fantastic today at 5.6.  Back into the normal range this is great.  We will keep an eye on this and plan to recheck again in 1 year.      Relevant Orders   POCT glycosylated hemoglobin (Hb A1C) (Completed)   Hypothyroid    Due to recheck TSH. Doing well on current regimen.          No orders of the defined types were placed in this encounter.   Follow-up: Return in about 6 months (around 03/06/2020).    Beatrice Lecher, MD

## 2019-09-05 NOTE — Assessment & Plan Note (Signed)
A1c looks fantastic today at 5.6.  Back into the normal range this is great.  We will keep an eye on this and plan to recheck again in 1 year.

## 2019-09-05 NOTE — Assessment & Plan Note (Signed)
Blood pressure is is okay today.  Reports getting great numbers at home.  Continue current regimen has been able to maintain his weight which is great.  Follow-up in 6 months.

## 2019-10-22 DIAGNOSIS — J449 Chronic obstructive pulmonary disease, unspecified: Secondary | ICD-10-CM | POA: Diagnosis not present

## 2019-10-22 DIAGNOSIS — R918 Other nonspecific abnormal finding of lung field: Secondary | ICD-10-CM | POA: Diagnosis not present

## 2019-10-22 DIAGNOSIS — R0600 Dyspnea, unspecified: Secondary | ICD-10-CM | POA: Diagnosis not present

## 2019-11-10 ENCOUNTER — Encounter: Payer: Self-pay | Admitting: Family Medicine

## 2019-11-12 NOTE — Telephone Encounter (Signed)
Patient scheduled.

## 2019-11-13 ENCOUNTER — Ambulatory Visit (INDEPENDENT_AMBULATORY_CARE_PROVIDER_SITE_OTHER): Payer: Medicare Other | Admitting: Family Medicine

## 2019-11-13 ENCOUNTER — Encounter: Payer: Self-pay | Admitting: Family Medicine

## 2019-11-13 VITALS — BP 164/64 | HR 58 | Ht 69.0 in | Wt 170.0 lb

## 2019-11-13 DIAGNOSIS — J432 Centrilobular emphysema: Secondary | ICD-10-CM

## 2019-11-13 DIAGNOSIS — R0602 Shortness of breath: Secondary | ICD-10-CM | POA: Diagnosis not present

## 2019-11-13 DIAGNOSIS — I10 Essential (primary) hypertension: Secondary | ICD-10-CM

## 2019-11-13 DIAGNOSIS — J441 Chronic obstructive pulmonary disease with (acute) exacerbation: Secondary | ICD-10-CM

## 2019-11-13 MED ORDER — PREDNISONE 20 MG PO TABS: 40.0000 mg | ORAL_TABLET | Freq: Every day | ORAL | 0 refills | Status: DC

## 2019-11-13 MED ORDER — DOXYCYCLINE HYCLATE 100 MG PO TABS: 100.0000 mg | ORAL_TABLET | Freq: Two times a day (BID) | ORAL | 0 refills | Status: DC

## 2019-11-13 NOTE — Assessment & Plan Note (Signed)
Blood pressure was a little elevated today but again he is not feeling well so we will keep an eye on this and plan to recheck at follow-up visit.

## 2019-11-13 NOTE — Patient Instructions (Signed)
[facility] the [facility] order you a nebulizer machine and vials of albuterol to use when you have a flare.

## 2019-11-13 NOTE — Assessment & Plan Note (Signed)
At this point I really feel like he should be on oxygen with activities and ambulation.  Actually think he can make a big difference in his quality of life and symptom control.  He does qualify based on his 6-minute walk test today.  He is going to try to contact the [facility] to get a portable oxygen device.

## 2019-11-13 NOTE — Progress Notes (Signed)
Acute Office Visit  Subjective:    Patient ID: Zachary Waters, male    DOB: 1940-06-11, 79 y.o.   MRN: [id number]  Chief Complaint  Patient presents with  . Shortness of Breath    HPI Patient is in today for increased SOB.  He says it started last Wednesday.  He had been out in the heat and humidity and later that evening he started feeling very short of breath and was panting.  He said he really could not catch his breath and his wife wanted to call EMS it lasted about 3 hours.  He says he actually felt really nauseated like he was going to vomit but never did.  He denies any recent chest pain he has been coughing a little bit more he said that started actually the day after the flare started.  He says initially he was getting a yellow-colored mucus and now it is more [place].  No fever.  He does have some chills when it first started.  He has been monitoring his pulse ox which has been okay.  He denies any lightheadedness.  He also had some body aches when it first started almost like he was getting the flu.  He has been trying to keep an eye on the humidity and air quality.  Past Medical History:  Diagnosis Date  . Cervical spondylosis with radiculopathy 03/08/2012  . COPD (chronic obstructive pulmonary disease) ([place])   . Dyspnea   . Essential hypertension   . GERD (gastroesophageal reflux disease) once in a great while  . Hyperlipidemia 02/02/2018  . Pneumonia 10/2009  . Thyroid disease     Past Surgical History:  Procedure Laterality Date  . COLONOSCOPY    . HERNIA REPAIR     inguinal hernia repair on right and left  . INGUINAL HERNIA REPAIR Bilateral 04/21/2016   Procedure: LAPAROSCOPIC LEFT INGUINAL HERNIA REPAIR AND RIGHT FEMORAL HERNIA REPAIR;  Surgeon: Michael Boston, MD;  Location: [place];  Service: General;  Laterality: Bilateral;  . INSERTION OF MESH Bilateral 04/21/2016   Procedure: INSERTION OF MESH LEFT INGUINAL HERNIA  AND INSERTION OF MESH RIGHT FEMORAL HERNIA;   Surgeon: Michael Boston, MD;  Location: [place];  Service: General;  Laterality: Bilateral;  . LEG SURGERY Right    tumor removed as a child from femur?  . ROTATOR CUFF REPAIR  2005   Left  . TONSILLECTOMY      Family History  Problem Relation Age of Onset  . Prostate cancer Father 36  . Heart disease Mother 54       deceased age 45  . Depression Brother        Commited suicide  . Depression Brother     Social History   Socioeconomic History  . Marital status: Married    Spouse name: [name]  . Number of children: 2  . Years of education: 38  . Highest education level: Associate degree: academic program  Occupational History  . Occupation: retired    Comment: Producer, television/film/video  Tobacco Use  . Smoking status: Former Smoker    Types: Pipe    Quit date: 10/07/2009    Years since quitting: 10.1  . Smokeless tobacco: Never Used  Vaping Use  . Vaping Use: Never used  Substance and Sexual Activity  . Alcohol use: Yes    Alcohol/week: 1.0 standard drink    Types: 1 Glasses of wine per week    Comment: occasional  . Drug use: No  .  Sexual activity: Never  Other Topics Concern  . Not on file  Social History Narrative   Power walks 3 days per week for 2 miles. Golfs twice a week.    Social Determinants of Health   Financial Resource Strain: Low Risk   . Difficulty of Paying Living Expenses: Not hard at all  Food Insecurity: No Food Insecurity  . Worried About Charity fundraiser in the Last Year: Never true  . Ran Out of Food in the Last Year: Never true  Transportation Needs: No Transportation Needs  . Lack of Transportation (Medical): No  . Lack of Transportation (Non-Medical): No  Physical Activity: Insufficiently Active  . Days of Exercise per Week: 2 days  . Minutes of Exercise per Session: 60 min  Stress: No Stress Concern Present  . Feeling of Stress : Not at all  Social Connections: Socially Integrated  . Frequency of Communication with Friends and Family: Twice a  week  . Frequency of Social Gatherings with Friends and Family: Twice a week  . Attends Religious Services: More than 4 times per year  . Active Member of Clubs or Organizations: Yes  . Attends Archivist Meetings: More than 4 times per year  . Marital Status: Married  Human resources officer Violence: Not At Risk  . Fear of Current or Ex-Partner: No  . Emotionally Abused: No  . Physically Abused: No  . Sexually Abused: No    Outpatient Medications Prior to Visit  Medication Sig Dispense Refill  . albuterol (PROAIR HFA) 108 (90 BASE) MCG/ACT inhaler Inhale 2 puffs into the lungs every 6 (six) hours as needed.      Marland Kitchen aspirin EC 81 MG tablet Take 81 mg by mouth daily.    . budesonide-formoterol (SYMBICORT) 160-4.5 MCG/ACT inhaler Inhale 2 puffs into the lungs 2 (two) times daily.    Marland Kitchen levothyroxine (SYNTHROID) 112 MCG tablet TAKE 1 TABLET (112 MCG TOTAL) BY MOUTH DAILY BEFORE BREAKFAST. 90 tablet 1  . lisinopril (ZESTRIL) 20 MG tablet TAKE 1 TABLET BY MOUTH EVERY DAY 90 tablet 1  . Omega-3 Fatty Acids (FISH OIL) 1000 MG CAPS Take 1 capsule by mouth daily.    Marland Kitchen QUERCETIN PO Take 2 tablets by mouth daily. Pt reports taking 1 tablet daily    . tiotropium (SPIRIVA HANDIHALER) 18 MCG inhalation capsule Place 1 capsule (18 mcg total) into inhaler and inhale daily. (Patient taking differently: Place 18 mcg into inhaler and inhale 2 (two) times daily. ) 1 capsule 0   No facility-administered medications prior to visit.    Allergies  Allergen Reactions  . No Known Allergies     Review of Systems     Objective:    Physical Exam Constitutional:      Appearance: He is well-developed.  HENT:     Head: Normocephalic and atraumatic.     Right Ear: External ear normal.     Left Ear: External ear normal.     Nose: Nose normal.  Eyes:     Conjunctiva/sclera: Conjunctivae normal.     Pupils: Pupils are equal, round, and reactive to light.  Neck:     Thyroid: No thyromegaly.   Cardiovascular:     Rate and Rhythm: Normal rate and regular rhythm.     Heart sounds: Normal heart sounds.  Pulmonary:     Effort: Pulmonary effort is normal.     Breath sounds: Normal breath sounds.     Comments: Diffuse poor air movement but no wheezing  or rhonchi. Musculoskeletal:     Cervical back: Neck supple.  Lymphadenopathy:     Cervical: No cervical adenopathy.  Skin:    General: Skin is warm and dry.  Neurological:     Mental Status: He is alert and oriented to person, place, and time.  Psychiatric:        Behavior: Behavior normal.     BP (!) 164/64   Pulse (!) 58   Ht 5\' 9"  (1.753 m)   Wt 170 lb (77.1 kg)   SpO2 98%   BMI 25.10 kg/m  Wt Readings from Last 3 Encounters:  11/13/19 170 lb (77.1 kg)  09/05/19 170 lb (77.1 kg)  03/07/19 178 lb (80.7 kg)    Health Maintenance Due  Topic Date Due  . Hepatitis C Screening  Never done    There are no preventive care reminders to display for this patient.   Lab Results  Component Value Date   TSH 1.09 03/08/2019   Lab Results  Component Value Date   WBC 5.1 03/08/2019   HGB 15.2 03/08/2019   HCT 45.9 03/08/2019   MCV 93.3 03/08/2019   PLT 246 03/08/2019   Lab Results  Component Value Date   NA 141 07/05/2018   K 4.9 07/05/2018   CO2 27 07/05/2018   GLUCOSE 75 07/05/2018   BUN 16 07/05/2018   CREATININE 1.04 07/05/2018   BILITOT 0.4 07/05/2018   ALKPHOS 66 12/28/2015   AST 16 07/05/2018   ALT 14 07/05/2018   PROT 6.2 07/05/2018   ALBUMIN 4.0 12/22/2014   CALCIUM 9.1 07/05/2018   ANIONGAP 8 04/21/2016   Lab Results  Component Value Date   CHOL 192 03/08/2019   Lab Results  Component Value Date   HDL 57 03/08/2019   Lab Results  Component Value Date   LDLCALC 119 (H) 03/08/2019   Lab Results  Component Value Date   TRIG 69 03/08/2019   Lab Results  Component Value Date   CHOLHDL 3.4 03/08/2019   Lab Results  Component Value Date   HGBA1C 5.6 09/05/2019       Assessment  & Plan:   Problem List Items Addressed This Visit      Cardiovascular and Mediastinum   Essential hypertension    Blood pressure was a little elevated today but again he is not feeling well so we will keep an eye on this and plan to recheck at follow-up visit.        Respiratory   Centrilobular emphysema ([place])    At this point I really feel like he should be on oxygen with activities and ambulation.  Actually think he can make a big difference in his quality of life and symptom control.  He does qualify based on his 6-minute walk test today.  He is going to [facility] to [facility] to get [facility] oxygen device.      Relevant Medications   predniSONE (DELTASONE) 20 MG tablet    Other Visit Diagnoses    COPD exacerbation ([place])    -  Primary   Relevant Medications   predniSONE (DELTASONE) 20 MG tablet   doxycycline (VIBRA-TABS) 100 MG tablet   SOB (shortness of breath)         COPD exacerbation-we will treat with prednisone burst and doxycycline.  If not significantly improving then please let us know.  He really has poor air movement on exam today but no crackles which is reassuring so we will hold off on  chest x-ray unless symptoms suddenly worsen.  I discussed the importance of seeking emergency care if the shortness of breath and symptoms are significant.  We did go ahead and do a 6-minute walk test on him his oxygen dropped to 87% with ambulation.  He responded well to 2 L of oxygen therapy.  He would qualify for home oxygen.  He will try to get this through the [place].  Emphysema-discussed getting him a home nebulizer again hopefully can get this through the [place] so that when he is feeling really short of breath he can just use his nebulizer is that of trying to use his MDIs which may not be as effective when he and he is having difficulty taking a deep breath.  Meds ordered this encounter  Medications  . predniSONE (DELTASONE) 20 MG tablet    Sig: Take 2 tablets (40 mg total) by  mouth daily with breakfast.    Dispense:  10 tablet    Refill:  0  . doxycycline (VIBRA-TABS) 100 MG tablet    Sig: Take 1 tablet (100 mg total) by mouth 2 (two) times daily.    Dispense:  20 tablet    Refill:  0     Beatrice Lecher, MD

## 2019-11-26 ENCOUNTER — Encounter: Payer: Self-pay | Admitting: Family Medicine

## 2019-11-27 NOTE — Telephone Encounter (Signed)
Task completed. Phone number was updated in pt's chart.

## 2019-12-18 DIAGNOSIS — J449 Chronic obstructive pulmonary disease, unspecified: Secondary | ICD-10-CM | POA: Diagnosis not present

## 2019-12-18 DIAGNOSIS — R0602 Shortness of breath: Secondary | ICD-10-CM | POA: Diagnosis not present

## 2019-12-18 DIAGNOSIS — Z87891 Personal history of nicotine dependence: Secondary | ICD-10-CM | POA: Diagnosis not present

## 2019-12-18 DIAGNOSIS — R918 Other nonspecific abnormal finding of lung field: Secondary | ICD-10-CM | POA: Diagnosis not present

## 2020-01-05 DIAGNOSIS — E874 Mixed disorder of acid-base balance: Secondary | ICD-10-CM | POA: Diagnosis not present

## 2020-01-05 DIAGNOSIS — N179 Acute kidney failure, unspecified: Secondary | ICD-10-CM | POA: Diagnosis not present

## 2020-01-05 DIAGNOSIS — E039 Hypothyroidism, unspecified: Secondary | ICD-10-CM | POA: Diagnosis not present

## 2020-01-05 DIAGNOSIS — I517 Cardiomegaly: Secondary | ICD-10-CM | POA: Diagnosis not present

## 2020-01-05 DIAGNOSIS — Z87891 Personal history of nicotine dependence: Secondary | ICD-10-CM | POA: Diagnosis not present

## 2020-01-05 DIAGNOSIS — T508X5A Adverse effect of diagnostic agents, initial encounter: Secondary | ICD-10-CM | POA: Diagnosis not present

## 2020-01-05 DIAGNOSIS — I462 Cardiac arrest due to underlying cardiac condition: Secondary | ICD-10-CM | POA: Diagnosis not present

## 2020-01-05 DIAGNOSIS — I499 Cardiac arrhythmia, unspecified: Secondary | ICD-10-CM | POA: Diagnosis not present

## 2020-01-05 DIAGNOSIS — Z7289 Other problems related to lifestyle: Secondary | ICD-10-CM | POA: Diagnosis not present

## 2020-01-05 DIAGNOSIS — J9602 Acute respiratory failure with hypercapnia: Secondary | ICD-10-CM | POA: Diagnosis not present

## 2020-01-05 DIAGNOSIS — I442 Atrioventricular block, complete: Secondary | ICD-10-CM | POA: Diagnosis not present

## 2020-01-05 DIAGNOSIS — J9601 Acute respiratory failure with hypoxia: Secondary | ICD-10-CM | POA: Diagnosis not present

## 2020-01-05 DIAGNOSIS — I10 Essential (primary) hypertension: Secondary | ICD-10-CM | POA: Diagnosis not present

## 2020-01-05 DIAGNOSIS — T465X5A Adverse effect of other antihypertensive drugs, initial encounter: Secondary | ICD-10-CM | POA: Diagnosis not present

## 2020-01-05 DIAGNOSIS — T45525A Adverse effect of antithrombotic drugs, initial encounter: Secondary | ICD-10-CM | POA: Diagnosis not present

## 2020-01-05 DIAGNOSIS — I2119 ST elevation (STEMI) myocardial infarction involving other coronary artery of inferior wall: Secondary | ICD-10-CM | POA: Diagnosis not present

## 2020-01-05 DIAGNOSIS — R140 Abdominal distension (gaseous): Secondary | ICD-10-CM | POA: Diagnosis not present

## 2020-01-05 DIAGNOSIS — R570 Cardiogenic shock: Secondary | ICD-10-CM | POA: Diagnosis not present

## 2020-01-05 DIAGNOSIS — I2111 ST elevation (STEMI) myocardial infarction involving right coronary artery: Secondary | ICD-10-CM | POA: Diagnosis not present

## 2020-01-05 DIAGNOSIS — J449 Chronic obstructive pulmonary disease, unspecified: Secondary | ICD-10-CM | POA: Diagnosis not present

## 2020-01-05 DIAGNOSIS — Z006 Encounter for examination for normal comparison and control in clinical research program: Secondary | ICD-10-CM | POA: Diagnosis not present

## 2020-01-05 DIAGNOSIS — R5381 Other malaise: Secondary | ICD-10-CM | POA: Diagnosis not present

## 2020-01-05 DIAGNOSIS — I472 Ventricular tachycardia: Secondary | ICD-10-CM | POA: Diagnosis not present

## 2020-01-05 DIAGNOSIS — Z45018 Encounter for adjustment and management of other part of cardiac pacemaker: Secondary | ICD-10-CM | POA: Diagnosis not present

## 2020-01-05 DIAGNOSIS — N141 Nephropathy induced by other drugs, medicaments and biological substances: Secondary | ICD-10-CM | POA: Diagnosis not present

## 2020-01-05 DIAGNOSIS — Z452 Encounter for adjustment and management of vascular access device: Secondary | ICD-10-CM | POA: Diagnosis not present

## 2020-01-05 DIAGNOSIS — Z743 Need for continuous supervision: Secondary | ICD-10-CM | POA: Diagnosis not present

## 2020-01-05 DIAGNOSIS — I213 ST elevation (STEMI) myocardial infarction of unspecified site: Secondary | ICD-10-CM | POA: Diagnosis not present

## 2020-01-05 DIAGNOSIS — K521 Toxic gastroenteritis and colitis: Secondary | ICD-10-CM | POA: Diagnosis not present

## 2020-01-05 DIAGNOSIS — R0789 Other chest pain: Secondary | ICD-10-CM | POA: Diagnosis not present

## 2020-01-05 DIAGNOSIS — I219 Acute myocardial infarction, unspecified: Secondary | ICD-10-CM | POA: Diagnosis not present

## 2020-01-05 DIAGNOSIS — Z950 Presence of cardiac pacemaker: Secondary | ICD-10-CM | POA: Diagnosis not present

## 2020-01-05 DIAGNOSIS — I080 Rheumatic disorders of both mitral and aortic valves: Secondary | ICD-10-CM | POA: Diagnosis not present

## 2020-01-05 DIAGNOSIS — Z955 Presence of coronary angioplasty implant and graft: Secondary | ICD-10-CM | POA: Diagnosis not present

## 2020-01-05 DIAGNOSIS — I2584 Coronary atherosclerosis due to calcified coronary lesion: Secondary | ICD-10-CM | POA: Diagnosis not present

## 2020-01-05 DIAGNOSIS — R001 Bradycardia, unspecified: Secondary | ICD-10-CM | POA: Diagnosis not present

## 2020-01-05 DIAGNOSIS — I25118 Atherosclerotic heart disease of native coronary artery with other forms of angina pectoris: Secondary | ICD-10-CM | POA: Diagnosis not present

## 2020-01-05 DIAGNOSIS — R918 Other nonspecific abnormal finding of lung field: Secondary | ICD-10-CM | POA: Diagnosis not present

## 2020-01-05 DIAGNOSIS — J9811 Atelectasis: Secondary | ICD-10-CM | POA: Diagnosis not present

## 2020-01-05 DIAGNOSIS — J90 Pleural effusion, not elsewhere classified: Secondary | ICD-10-CM | POA: Diagnosis not present

## 2020-01-05 DIAGNOSIS — R7989 Other specified abnormal findings of blood chemistry: Secondary | ICD-10-CM | POA: Diagnosis not present

## 2020-01-05 DIAGNOSIS — I4901 Ventricular fibrillation: Secondary | ICD-10-CM | POA: Diagnosis not present

## 2020-01-05 DIAGNOSIS — R0602 Shortness of breath: Secondary | ICD-10-CM | POA: Diagnosis not present

## 2020-01-05 DIAGNOSIS — R7302 Impaired glucose tolerance (oral): Secondary | ICD-10-CM | POA: Diagnosis not present

## 2020-01-05 DIAGNOSIS — I2510 Atherosclerotic heart disease of native coronary artery without angina pectoris: Secondary | ICD-10-CM | POA: Diagnosis not present

## 2020-01-06 DIAGNOSIS — I2111 ST elevation (STEMI) myocardial infarction involving right coronary artery: Secondary | ICD-10-CM | POA: Diagnosis not present

## 2020-01-06 DIAGNOSIS — J9601 Acute respiratory failure with hypoxia: Secondary | ICD-10-CM | POA: Diagnosis not present

## 2020-01-06 DIAGNOSIS — J449 Chronic obstructive pulmonary disease, unspecified: Secondary | ICD-10-CM | POA: Diagnosis not present

## 2020-01-06 DIAGNOSIS — R570 Cardiogenic shock: Secondary | ICD-10-CM | POA: Diagnosis not present

## 2020-01-06 DIAGNOSIS — Z87891 Personal history of nicotine dependence: Secondary | ICD-10-CM | POA: Insufficient documentation

## 2020-01-06 DIAGNOSIS — I080 Rheumatic disorders of both mitral and aortic valves: Secondary | ICD-10-CM | POA: Diagnosis not present

## 2020-01-11 ENCOUNTER — Other Ambulatory Visit: Payer: Self-pay | Admitting: Family Medicine

## 2020-01-11 DIAGNOSIS — I25119 Atherosclerotic heart disease of native coronary artery with unspecified angina pectoris: Secondary | ICD-10-CM | POA: Insufficient documentation

## 2020-01-14 ENCOUNTER — Encounter: Payer: Self-pay | Admitting: Family Medicine

## 2020-01-16 DIAGNOSIS — I5030 Unspecified diastolic (congestive) heart failure: Secondary | ICD-10-CM | POA: Diagnosis not present

## 2020-01-16 DIAGNOSIS — I25119 Atherosclerotic heart disease of native coronary artery with unspecified angina pectoris: Secondary | ICD-10-CM | POA: Diagnosis not present

## 2020-01-16 DIAGNOSIS — Z95810 Presence of automatic (implantable) cardiac defibrillator: Secondary | ICD-10-CM | POA: Diagnosis not present

## 2020-01-20 ENCOUNTER — Ambulatory Visit (INDEPENDENT_AMBULATORY_CARE_PROVIDER_SITE_OTHER): Payer: Medicare Other | Admitting: Family Medicine

## 2020-01-20 ENCOUNTER — Encounter: Payer: Self-pay | Admitting: Family Medicine

## 2020-01-20 ENCOUNTER — Other Ambulatory Visit: Payer: Self-pay | Admitting: Family Medicine

## 2020-01-20 VITALS — BP 118/73 | HR 55 | Ht 69.0 in | Wt 165.0 lb

## 2020-01-20 DIAGNOSIS — J432 Centrilobular emphysema: Secondary | ICD-10-CM

## 2020-01-20 DIAGNOSIS — I25119 Atherosclerotic heart disease of native coronary artery with unspecified angina pectoris: Secondary | ICD-10-CM

## 2020-01-20 DIAGNOSIS — I5189 Other ill-defined heart diseases: Secondary | ICD-10-CM | POA: Insufficient documentation

## 2020-01-20 DIAGNOSIS — I5032 Chronic diastolic (congestive) heart failure: Secondary | ICD-10-CM | POA: Diagnosis not present

## 2020-01-20 NOTE — Progress Notes (Signed)
Established Patient Office Visit  Subjective:  Patient ID: Zachary Waters, male    DOB: 14-May-1940  Age: 79 y.o. MRN: [id number]  CC:  Chief Complaint  Patient presents with  . Hospitalization Follow-up    HPI Zachary Waters presents for hospital follow-up.  He was admitted on 01/05/2020 for severe chest pain that had been going on for about a week prior to his arrival at the [facility] [place].  Bradycardic with heart block and on EKG he was also noted to have acute ST elevation in the inferior leads he was transferred to [facility] for emergent heart catheterization.  He received percutaneous coronary intervention of the right coronary artery with arthrectomy.  During the procedure he had one episode of ventricular fibrillation that required defibrillation.  He was then intubated during that time due to respiratory status.  After the catheterization he did require IV pressors and was placed on mechanical ventilation.  He was extubated fairly quickly and then on day 2 of his hospitalization suffered ventricular tachycardia with subsequent cardiac arrest.  CPR was initiated and he was defibrillated.  He did return to spontaneous circulation after 4 minutes of advanced care life support interventions and was taken to the cardiac Cath Lab for further evaluation due to the V. fib arrest.  He then had a successful implantation of an ICD placed.   He was discharged home on aspirin and Plavix.  Since hospitalization he has had a follow-up with cardiology on November 9 at [facility].  Left ventricular EF of 50 to 55%.  When seen he was complaining of increased shortness of breath and lower extremity edema in fact his weight was up about 13 pounds.  He was started on Lasix 40 mg daily as well as K-Dur.  They want to repeat his BMP in 1 week.  Past Medical History:  Diagnosis Date  . Cervical spondylosis with radiculopathy 03/08/2012  . COPD (chronic  obstructive pulmonary disease) ([place])   . Dyspnea   . Essential hypertension   . GERD (gastroesophageal reflux disease) once in a great while  . Hyperlipidemia 02/02/2018  . Pneumonia 10/2009  . Thyroid disease     Past Surgical History:  Procedure Laterality Date  . COLONOSCOPY    . HERNIA REPAIR     inguinal hernia repair on right and left  . INGUINAL HERNIA REPAIR Bilateral 04/21/2016   Procedure: LAPAROSCOPIC LEFT INGUINAL HERNIA REPAIR AND RIGHT FEMORAL HERNIA REPAIR;  Surgeon: Michael Boston, MD;  Location: [place];  Service: General;  Laterality: Bilateral;  . INSERTION OF MESH Bilateral 04/21/2016   Procedure: INSERTION OF MESH LEFT INGUINAL HERNIA  AND INSERTION OF MESH RIGHT FEMORAL HERNIA;  Surgeon: Michael Boston, MD;  Location: [place];  Service: General;  Laterality: Bilateral;  . LEG SURGERY Right    tumor removed as a child from femur?  . ROTATOR CUFF REPAIR  2005   Left  . TONSILLECTOMY      Family History  Problem Relation Age of Onset  . Prostate cancer Father 24  . Heart disease Mother 67       deceased age 79  . Depression Brother        Commited suicide  . Depression Brother     Social History   Socioeconomic History  . Marital status: Married    Spouse name: [name]  . Number of children: 2  . Years of education: 27  . Highest education level: Associate degree:  academic program  Occupational History  . Occupation: retired    Comment: Producer, television/film/video  Tobacco Use  . Smoking status: Former Smoker    Types: Pipe    Quit date: 10/07/2009    Years since quitting: 10.2  . Smokeless tobacco: Never Used  Vaping Use  . Vaping Use: Never used  Substance and Sexual Activity  . Alcohol use: Yes    Alcohol/week: 1.0 standard drink    Types: 1 Glasses of wine per week    Comment: occasional  . Drug use: No  . Sexual activity: Never  Other Topics Concern  . Not on file  Social History Narrative   Power walks 3 days per week for 2 miles. Golfs twice a week.     Social Determinants of Health   Financial Resource Strain: Low Risk   . Difficulty of Paying Living Expenses: Not hard at all  Food Insecurity: No Food Insecurity  . Worried About Charity fundraiser in the Last Year: Never true  . Ran Out of Food in the Last Year: Never true  Transportation Needs: No Transportation Needs  . Lack of Transportation (Medical): No  . Lack of Transportation (Non-Medical): No  Physical Activity: Insufficiently Active  . Days of Exercise per Week: 2 days  . Minutes of Exercise per Session: 60 min  Stress: No Stress Concern Present  . Feeling of Stress : Not at all  Social Connections: Socially Integrated  . Frequency of Communication with Friends and Family: Twice a week  . Frequency of Social Gatherings with Friends and Family: Twice a week  . Attends Religious Services: More than 4 times per year  . Active Member of Clubs or Organizations: Yes  . Attends Archivist Meetings: More than 4 times per year  . Marital Status: Married  Human resources officer Violence: Not At Risk  . Fear of Current or Ex-Partner: No  . Emotionally Abused: No  . Physically Abused: No  . Sexually Abused: No    Outpatient Medications Prior to Visit  Medication Sig Dispense Refill  . atorvastatin (LIPITOR) 80 MG tablet Take 80 mg by mouth daily.  3  . carvedilol (COREG) 6.25 MG tablet Take 6.25 mg by mouth 2 (two) times daily.    . clopidogrel (PLAVIX) 75 MG tablet Take 75 mg by mouth daily.  9  . furosemide (LASIX) 40 MG tablet Take 40 mg by mouth daily.  3  . lisinopril (ZESTRIL) 10 MG tablet Take 10 mg by mouth daily.  3  . Potassium Chloride ER 20 MEQ TBCR Take 1 tablet by mouth daily.  3  . albuterol (PROAIR HFA) 108 (90 BASE) MCG/ACT inhaler Inhale 2 puffs into the lungs every 6 (six) hours as needed.      Marland Kitchen aspirin EC 81 MG tablet Take 81 mg by mouth daily.    . budesonide-formoterol (SYMBICORT) 160-4.5 MCG/ACT inhaler Inhale 2 puffs into the lungs 2 (two)  times daily.    Marland Kitchen levothyroxine (SYNTHROID) 112 MCG tablet TAKE 1 TABLET BY MOUTH DAILY BEFORE BREAKFAST 90 tablet 1  . Omega-3 Fatty Acids (FISH OIL) 1000 MG CAPS Take 1 capsule by mouth daily.    Marland Kitchen QUERCETIN PO Take 2 tablets by mouth daily. Pt reports taking 1 tablet daily    . tiotropium (SPIRIVA HANDIHALER) 18 MCG inhalation capsule Place 1 capsule (18 mcg total) into inhaler and inhale daily. (Patient taking differently: Place 18 mcg into inhaler and inhale 2 (two) times daily. ) 1  capsule 0  . doxycycline (VIBRA-TABS) 100 MG tablet Take 1 tablet (100 mg total) by mouth 2 (two) times daily. 20 tablet 0  . lisinopril (ZESTRIL) 20 MG tablet TAKE 1 TABLET BY MOUTH EVERY DAY 90 tablet 1  . predniSONE (DELTASONE) 20 MG tablet Take 2 tablets (40 mg total) by mouth daily with breakfast. 10 tablet 0   No facility-administered medications prior to visit.    Allergies  Allergen Reactions  . No Known Allergies     ROS Review of Systems    Objective:    Physical Exam Constitutional:      Appearance: He is well-developed.  HENT:     Head: Normocephalic and atraumatic.  Cardiovascular:     Rate and Rhythm: Normal rate and regular rhythm.     Heart sounds: Normal heart sounds.  Pulmonary:     Effort: Pulmonary effort is normal.     Breath sounds: Normal breath sounds.     Comments: Slight expiratory wheeze at the left lung base. Skin:    General: Skin is warm and dry.  Neurological:     Mental Status: He is alert and oriented to person, place, and time.  Psychiatric:        Behavior: Behavior normal.     BP 118/73   Pulse (!) 55   Ht 5\' 9"  (1.753 m)   Wt 165 lb (74.8 kg)   SpO2 99%   BMI 24.37 kg/m  Wt Readings from Last 3 Encounters:  01/20/20 165 lb (74.8 kg)  11/13/19 170 lb (77.1 kg)  09/05/19 170 lb (77.1 kg)     There are no preventive care reminders to display for this patient.  There are no preventive care reminders to display for this patient.  Lab  Results  Component Value Date   TSH 1.09 03/08/2019   Lab Results  Component Value Date   WBC 5.1 03/08/2019   HGB 15.2 03/08/2019   HCT 45.9 03/08/2019   MCV 93.3 03/08/2019   PLT 246 03/08/2019   Lab Results  Component Value Date   NA 141 07/05/2018   K 4.9 07/05/2018   CO2 27 07/05/2018   GLUCOSE 75 07/05/2018   BUN 16 07/05/2018   CREATININE 1.04 07/05/2018   BILITOT 0.4 07/05/2018   ALKPHOS 66 12/28/2015   AST 16 07/05/2018   ALT 14 07/05/2018   PROT 6.2 07/05/2018   ALBUMIN 4.0 12/22/2014   CALCIUM 9.1 07/05/2018   ANIONGAP 8 04/21/2016   Lab Results  Component Value Date   CHOL 192 03/08/2019   Lab Results  Component Value Date   HDL 57 03/08/2019   Lab Results  Component Value Date   LDLCALC 119 (H) 03/08/2019   Lab Results  Component Value Date   TRIG 69 03/08/2019   Lab Results  Component Value Date   CHOLHDL 3.4 03/08/2019   Lab Results  Component Value Date   HGBA1C 5.6 09/05/2019      Assessment & Plan:   Problem List Items Addressed This Visit      Cardiovascular and Mediastinum   Heart failure with preserved ejection fraction ([place])    Is responded well to the Lasix and is down at least 10 pounds if not 12.  He has a follow-up in a couple of days on Wednesday for repeat labs and at that point time if he is doing well they will likely discontinue the Lasix or at least decrease the frequency for which he is taking it.  He did ask about the potassium and I explained that Lasix actually cause you to lose potassium and so we have to use a supplement while taking it.      Relevant Medications   carvedilol (COREG) 6.25 MG tablet   furosemide (LASIX) 40 MG tablet   atorvastatin (LIPITOR) 80 MG tablet   lisinopril (ZESTRIL) 10 MG tablet   Coronary artery disease involving native coronary artery of native heart with angina pectoris ([place]) - Primary    Really doing well considering he had to be resuscitated twice.  His oxygen level looks great  today.  He is getting his strength back and feeling better.  Taking his medications very consistently.      Relevant Medications   carvedilol (COREG) 6.25 MG tablet   furosemide (LASIX) 40 MG tablet   atorvastatin (LIPITOR) 80 MG tablet   lisinopril (ZESTRIL) 10 MG tablet     Respiratory   Centrilobular emphysema (HCC)    Was intubated during his hospitalization and again he is actually doing really well he came in today and walked in without significant difficulty does have a follow-up with Dr. Michela Pitcher coming up in the next couple weeks.  Is hopeful that actually treating his coronary artery disease might actually improve his breathing.  He does have a slight cough today so just encouraged him to keep an eye on that if he feels like it is becoming more productive or more frequent to please let me know.         No orders of the defined types were placed in this encounter.   Follow-up: No follow-ups on file.   Spent 30 minutes in encounter including reviewing hospital notes and cardiology notes.  Beatrice Lecher, MD

## 2020-01-20 NOTE — Assessment & Plan Note (Signed)
Really doing well considering he had to be resuscitated twice.  His oxygen level looks great today.  He is getting his strength back and feeling better.  Taking his medications very consistently.

## 2020-01-20 NOTE — Assessment & Plan Note (Signed)
Is responded well to the Lasix and is down at least 10 pounds if not 12.  He has a follow-up in a couple of days on Wednesday for repeat labs and at that point time if he is doing well they will likely discontinue the Lasix or at least decrease the frequency for which he is taking it.  He did ask about the potassium and I explained that Lasix actually cause you to lose potassium and so we have to use a supplement while taking it.

## 2020-01-20 NOTE — Assessment & Plan Note (Addendum)
Was intubated during his hospitalization and again he is actually doing really well he came in today and walked in without significant difficulty does have a follow-up with Dr. Michela Pitcher coming up in the next couple weeks.  Is hopeful that actually treating his coronary artery disease might actually improve his breathing.  He does have a slight cough today so just encouraged him to keep an eye on that if he feels like it is becoming more productive or more frequent to please let me know.

## 2020-01-21 DIAGNOSIS — R0600 Dyspnea, unspecified: Secondary | ICD-10-CM | POA: Diagnosis not present

## 2020-01-21 DIAGNOSIS — J449 Chronic obstructive pulmonary disease, unspecified: Secondary | ICD-10-CM | POA: Diagnosis not present

## 2020-01-22 DIAGNOSIS — I5030 Unspecified diastolic (congestive) heart failure: Secondary | ICD-10-CM | POA: Diagnosis not present

## 2020-01-23 DIAGNOSIS — I25119 Atherosclerotic heart disease of native coronary artery with unspecified angina pectoris: Secondary | ICD-10-CM | POA: Diagnosis not present

## 2020-01-23 DIAGNOSIS — Z95810 Presence of automatic (implantable) cardiac defibrillator: Secondary | ICD-10-CM | POA: Diagnosis not present

## 2020-01-23 DIAGNOSIS — R609 Edema, unspecified: Secondary | ICD-10-CM | POA: Diagnosis not present

## 2020-01-23 DIAGNOSIS — I2510 Atherosclerotic heart disease of native coronary artery without angina pectoris: Secondary | ICD-10-CM | POA: Diagnosis not present

## 2020-01-27 DIAGNOSIS — R609 Edema, unspecified: Secondary | ICD-10-CM | POA: Diagnosis not present

## 2020-02-14 ENCOUNTER — Other Ambulatory Visit: Payer: Self-pay | Admitting: Family Medicine

## 2020-02-25 DIAGNOSIS — I472 Ventricular tachycardia: Secondary | ICD-10-CM | POA: Diagnosis not present

## 2020-02-28 ENCOUNTER — Other Ambulatory Visit: Payer: Self-pay | Admitting: Family Medicine

## 2020-03-05 DIAGNOSIS — Z48812 Encounter for surgical aftercare following surgery on the circulatory system: Secondary | ICD-10-CM | POA: Diagnosis not present

## 2020-03-05 DIAGNOSIS — Z955 Presence of coronary angioplasty implant and graft: Secondary | ICD-10-CM | POA: Diagnosis not present

## 2020-03-05 DIAGNOSIS — I252 Old myocardial infarction: Secondary | ICD-10-CM | POA: Diagnosis not present

## 2020-03-10 ENCOUNTER — Encounter: Payer: Self-pay | Admitting: Family Medicine

## 2020-03-10 ENCOUNTER — Ambulatory Visit (INDEPENDENT_AMBULATORY_CARE_PROVIDER_SITE_OTHER): Payer: Medicare Other | Admitting: Family Medicine

## 2020-03-10 VITALS — BP 158/62 | HR 64 | Ht 69.0 in | Wt 166.0 lb

## 2020-03-10 DIAGNOSIS — I10 Essential (primary) hypertension: Secondary | ICD-10-CM

## 2020-03-10 DIAGNOSIS — Z0000 Encounter for general adult medical examination without abnormal findings: Secondary | ICD-10-CM

## 2020-03-10 DIAGNOSIS — I252 Old myocardial infarction: Secondary | ICD-10-CM | POA: Diagnosis not present

## 2020-03-10 DIAGNOSIS — I25119 Atherosclerotic heart disease of native coronary artery with unspecified angina pectoris: Secondary | ICD-10-CM | POA: Diagnosis not present

## 2020-03-10 DIAGNOSIS — Z48812 Encounter for surgical aftercare following surgery on the circulatory system: Secondary | ICD-10-CM | POA: Diagnosis not present

## 2020-03-10 DIAGNOSIS — Z955 Presence of coronary angioplasty implant and graft: Secondary | ICD-10-CM | POA: Diagnosis not present

## 2020-03-10 NOTE — Patient Instructions (Signed)
Health Maintenance After Age 77 After age 77, you are at a higher risk for certain long-term diseases and infections as well as injuries from falls. Falls are a major cause of broken bones and head injuries in people who are older than age 77. Getting regular preventive care can help to keep you healthy and well. Preventive care includes getting regular testing and making lifestyle changes as recommended by your health care provider. Talk with your health care provider about:  Which screenings and tests you should have. A screening is a test that checks for a disease when you have no symptoms.  A diet and exercise plan that is right for you. What should I know about screenings and tests to prevent falls? Screening and testing are the best ways to find a health problem early. Early diagnosis and treatment give you the best chance of managing medical conditions that are common after age 77. Certain conditions and lifestyle choices may make you more likely to have a fall. Your health care provider may recommend:  Regular vision checks. Poor vision and conditions such as cataracts can make you more likely to have a fall. If you wear glasses, make sure to get your prescription updated if your vision changes.  Medicine review. Work with your health care provider to regularly review all of the medicines you are taking, including over-the-counter medicines. Ask your health care provider about any side effects that may make you more likely to have a fall. Tell your health care provider if any medicines that you take make you feel dizzy or sleepy.  Osteoporosis screening. Osteoporosis is a condition that causes the bones to get weaker. This can make the bones weak and cause them to break more easily.  Blood pressure screening. Blood pressure changes and medicines to control blood pressure can make you feel dizzy.  Strength and balance checks. Your health care provider may recommend certain tests to check your  strength and balance while standing, walking, or changing positions.  Foot health exam. Foot pain and numbness, as well as not wearing proper footwear, can make you more likely to have a fall.  Depression screening. You may be more likely to have a fall if you have a fear of falling, feel emotionally low, or feel unable to do activities that you used to do.  Alcohol use screening. Using too much alcohol can affect your balance and may make you more likely to have a fall. What actions can I take to lower my risk of falls? General instructions  Talk with your health care provider about your risks for falling. Tell your health care provider if: ? You fall. Be sure to tell your health care provider about all falls, even ones that seem minor. ? You feel dizzy, sleepy, or off-balance.  Take over-the-counter and prescription medicines only as told by your health care provider. These include any supplements.  Eat a healthy diet and maintain a healthy weight. A healthy diet includes low-fat dairy products, low-fat (lean) meats, and fiber from whole grains, beans, and lots of fruits and vegetables. Home safety  Remove any tripping hazards, such as rugs, cords, and clutter.  Install safety equipment such as grab bars in bathrooms and safety rails on stairs.  Keep rooms and walkways well-lit. Activity   Follow a regular exercise program to stay fit. This will help you maintain your balance. Ask your health care provider what types of exercise are appropriate for you.  If you need a cane or   walker, use it as recommended by your health care provider.  Wear supportive shoes that have nonskid soles. Lifestyle  Do not drink alcohol if your health care provider tells you not to drink.  If you drink alcohol, limit how much you have: ? 0-1 drink a day for women. ? 0-2 drinks a day for men.  Be aware of how much alcohol is in your drink. In the [place] one drink equals one typical bottle of beer (12  oz), one-half glass of wine (5 oz), or one shot of hard liquor (1 oz).  Do not use any products that contain nicotine or tobacco, such as cigarettes and e-cigarettes. If you need help quitting, ask your health care provider. Summary  Having a healthy lifestyle and getting preventive care can help to protect your health and wellness after age 77.  Screening and testing are the best way to find a health problem early and help you avoid having a fall. Early diagnosis and treatment give you the best chance for managing medical conditions that are more common for people who are older than age 77.  Falls are a major cause of broken bones and head injuries in people who are older than age 77. Take precautions to prevent a fall at home.  Work with your health care provider to learn what changes you can make to improve your health and wellness and to prevent falls. This information is not intended to replace advice given to you by your health care provider. Make sure you discuss any questions you have with your health care provider. Document Revised: 08/16/2018 Document Reviewed: 03/08/2017 Elsevier Patient Education  2020 [employer]  

## 2020-03-10 NOTE — Progress Notes (Signed)
CPE  Established Patient Office Visit  Subjective:  Patient ID: Zachary Waters, male    DOB: 03-15-1941  Age: 79 y.o. MRN: [id number]  CC:  Chief Complaint  Patient presents with  . Annual Exam    HPI Zachary Waters presents for CPE. He is back to golfing. He is in cardiac rehab, just started last Thursday.  He is actually been doing well overall they have been using some oxygen just during exercise.  He has been mostly doing the stationary reclining bike.  He has not been able to really exercise from his 2 months since his heart attacks its been a bit of an adjustment getting back into it.  He will be going twice a week.  They did recently adjust his lisinopril so he is actually taking only 10 mg.  No recent chest pain.  Health maintenance is up-to-date.  Tetanus is within date as well as pneumonia vaccine, Covid vaccination and flu vaccine.  Is planning on getting his mother in a Covid booster at the [place] he has an upcoming appointment in the next couple weeks.  Past Medical History:  Diagnosis Date  . Cervical spondylosis with radiculopathy 03/08/2012  . COPD (chronic obstructive pulmonary disease) ([place])   . Dyspnea   . Essential hypertension   . GERD (gastroesophageal reflux disease) once in a great while  . Hyperlipidemia 02/02/2018  . Pneumonia 10/2009  . Thyroid disease     Past Surgical History:  Procedure Laterality Date  . COLONOSCOPY    . HERNIA REPAIR     inguinal hernia repair on right and left  . INGUINAL HERNIA REPAIR Bilateral 04/21/2016   Procedure: LAPAROSCOPIC LEFT INGUINAL HERNIA REPAIR AND RIGHT FEMORAL HERNIA REPAIR;  Surgeon: Michael Boston, MD;  Location: [facility];  Service: General;  Laterality: Bilateral;  . INSERTION OF MESH Bilateral 04/21/2016   Procedure: INSERTION OF MESH LEFT INGUINAL HERNIA  AND INSERTION OF MESH RIGHT FEMORAL HERNIA;  Surgeon: Michael Boston, MD;  Location: [facility];  Service: General;  Laterality: Bilateral;  . LEG SURGERY Right     tumor removed as a child from femur?  . ROTATOR CUFF REPAIR  2005   Left  . TONSILLECTOMY      Family History  Problem Relation Age of Onset  . Prostate cancer Father 42  . Heart disease Mother 67       deceased age 69  . Depression Brother        Commited suicide  . Depression Brother     Social History   Socioeconomic History  . Marital status: Married    Spouse name: [name]  . Number of children: 2  . Years of education: 8  . Highest education level: Associate degree: academic program  Occupational History  . Occupation: retired    Comment: Producer, television/film/video  Tobacco Use  . Smoking status: Former Smoker    Types: Pipe    Quit date: 10/07/2009    Years since quitting: 10.4  . Smokeless tobacco: Never Used  Vaping Use  . Vaping Use: Never used  Substance and Sexual Activity  . Alcohol use: Yes    Alcohol/week: 1.0 standard drink    Types: 1 Glasses of wine per week    Comment: occasional  . Drug use: No  . Sexual activity: Never  Other Topics Concern  . Not on file  Social History Narrative   Power walks 3 days per week for 2 miles. Golfs twice a week.  Social Determinants of Health   Financial Resource Strain:   . Difficulty of Paying Living Expenses: Not on file  Food Insecurity:   . Worried About Charity fundraiser in the Last Year: Not on file  . Ran Out of Food in the Last Year: Not on file  Transportation Needs:   . Lack of Transportation (Medical): Not on file  . Lack of Transportation (Non-Medical): Not on file  Physical Activity:   . Days of Exercise per Week: Not on file  . Minutes of Exercise per Session: Not on file  Stress:   . Feeling of Stress : Not on file  Social Connections:   . Frequency of Communication with Friends and Family: Not on file  . Frequency of Social Gatherings with Friends and Family: Not on file  . Attends Religious Services: Not on file  . Active Member of Clubs or Organizations: Not on file  . Attends Theatre manager Meetings: Not on file  . Marital Status: Not on file  Intimate Partner Violence:   . Fear of Current or Ex-Partner: Not on file  . Emotionally Abused: Not on file  . Physically Abused: Not on file  . Sexually Abused: Not on file    Outpatient Medications Prior to Visit  Medication Sig Dispense Refill  . albuterol (PROAIR HFA) 108 (90 BASE) MCG/ACT inhaler Inhale 2 puffs into the lungs every 6 (six) hours as needed.      Marland Kitchen aspirin EC 81 MG tablet Take 81 mg by mouth daily.    Marland Kitchen atorvastatin (LIPITOR) 80 MG tablet Take 80 mg by mouth daily.  3  . budesonide-formoterol (SYMBICORT) 160-4.5 MCG/ACT inhaler Inhale 2 puffs into the lungs 2 (two) times daily.    . carvedilol (COREG) 6.25 MG tablet Take 6.25 mg by mouth 2 (two) times daily.    . clopidogrel (PLAVIX) 75 MG tablet Take 75 mg by mouth daily.  9  . furosemide (LASIX) 40 MG tablet Take 40 mg by mouth daily.  3  . levothyroxine (SYNTHROID) 112 MCG tablet TAKE 1 TABLET BY MOUTH DAILY BEFORE BREAKFAST 90 tablet 1  . lisinopril (ZESTRIL) 10 MG tablet Take 10 mg by mouth daily.  3  . Omega-3 Fatty Acids (FISH OIL) 1000 MG CAPS Take 1 capsule by mouth daily.    . Potassium Chloride ER 20 MEQ TBCR Take 1 tablet by mouth daily.  3  . QUERCETIN PO Take 2 tablets by mouth daily. Pt reports taking 1 tablet daily    . tiotropium (SPIRIVA HANDIHALER) 18 MCG inhalation capsule Place 1 capsule (18 mcg total) into inhaler and inhale daily. (Patient taking differently: Place 18 mcg into inhaler and inhale 2 (two) times daily. ) 1 capsule 0  . lisinopril (ZESTRIL) 20 MG tablet TAKE 1 TABLET BY MOUTH EVERY DAY 90 tablet 3   No facility-administered medications prior to visit.    Allergies  Allergen Reactions  . No Known Allergies     ROS Review of Systems    Objective:    Physical Exam Constitutional:      Appearance: He is well-developed.  HENT:     Head: Normocephalic and atraumatic.     Right Ear: External ear normal.      Left Ear: External ear normal.     Nose: Nose normal.  Eyes:     Conjunctiva/sclera: Conjunctivae normal.     Pupils: Pupils are equal, round, and reactive to light.  Neck:     Thyroid:  No thyromegaly.  Cardiovascular:     Rate and Rhythm: Normal rate and regular rhythm.     Heart sounds: Normal heart sounds.  Pulmonary:     Effort: Pulmonary effort is normal.     Breath sounds: Normal breath sounds.  Abdominal:     General: Bowel sounds are normal. There is no distension.     Palpations: Abdomen is soft. There is no mass.     Tenderness: There is no abdominal tenderness. There is no guarding or rebound.  Musculoskeletal:        General: Normal range of motion.     Cervical back: Normal range of motion and neck supple.  Lymphadenopathy:     Cervical: No cervical adenopathy.  Skin:    General: Skin is warm and dry.  Neurological:     Mental Status: He is alert and oriented to person, place, and time.     Deep Tendon Reflexes: Reflexes are normal and symmetric.  Psychiatric:        Behavior: Behavior normal.        Thought Content: Thought content normal.        Judgment: Judgment normal.     BP (!) 158/62   Pulse 64   Ht 5\' 9"  (1.753 m)   Wt 166 lb (75.3 kg)   SpO2 96%   BMI 24.51 kg/m  Wt Readings from Last 3 Encounters:  03/10/20 166 lb (75.3 kg)  01/20/20 165 lb (74.8 kg)  11/13/19 170 lb (77.1 kg)     There are no preventive care reminders to display for this patient.  There are no preventive care reminders to display for this patient.  Lab Results  Component Value Date   TSH 1.09 03/08/2019   Lab Results  Component Value Date   WBC 5.1 03/08/2019   HGB 15.2 03/08/2019   HCT 45.9 03/08/2019   MCV 93.3 03/08/2019   PLT 246 03/08/2019   Lab Results  Component Value Date   NA 141 07/05/2018   K 4.9 07/05/2018   CO2 27 07/05/2018   GLUCOSE 75 07/05/2018   BUN 16 07/05/2018   CREATININE 1.04 07/05/2018   BILITOT 0.4 07/05/2018   ALKPHOS 66  12/28/2015   AST 16 07/05/2018   ALT 14 07/05/2018   PROT 6.2 07/05/2018   ALBUMIN 4.0 12/22/2014   CALCIUM 9.1 07/05/2018   ANIONGAP 8 04/21/2016   Lab Results  Component Value Date   CHOL 192 03/08/2019   Lab Results  Component Value Date   HDL 57 03/08/2019   Lab Results  Component Value Date   LDLCALC 119 (H) 03/08/2019   Lab Results  Component Value Date   TRIG 69 03/08/2019   Lab Results  Component Value Date   CHOLHDL 3.4 03/08/2019   Lab Results  Component Value Date   HGBA1C 5.6 09/05/2019      Assessment & Plan:   Problem List Items Addressed This Visit      Cardiovascular and Mediastinum   Essential hypertension   Relevant Orders   CBC   COMPLETE METABOLIC PANEL WITH GFR   Lipid panel   TSH   Coronary artery disease involving native coronary artery of native heart with angina pectoris ([place])   Relevant Orders   CBC   COMPLETE METABOLIC PANEL WITH GFR   Lipid panel   TSH    Other Visit Diagnoses    Wellness examination    -  Primary   Relevant Orders   CBC  COMPLETE METABOLIC PANEL WITH GFR   Lipid panel   TSH     Keep up a regular exercise program and make sure you are eating a healthy diet Try to eat 4 servings of dairy a day, or if you are lactose intolerant take a calcium with vitamin D daily.  Your vaccines are up to date.  You for updated lab work. Will get up dated labs.   Enrolled in Cardiac Rehab.     No orders of the defined types were placed in this encounter.   Follow-up: No follow-ups on file.    Beatrice Lecher, MD

## 2020-03-12 DIAGNOSIS — I252 Old myocardial infarction: Secondary | ICD-10-CM | POA: Diagnosis not present

## 2020-03-12 DIAGNOSIS — Z48812 Encounter for surgical aftercare following surgery on the circulatory system: Secondary | ICD-10-CM | POA: Diagnosis not present

## 2020-03-12 DIAGNOSIS — Z955 Presence of coronary angioplasty implant and graft: Secondary | ICD-10-CM | POA: Diagnosis not present

## 2020-03-13 ENCOUNTER — Other Ambulatory Visit: Payer: Self-pay | Admitting: *Deleted

## 2020-03-13 ENCOUNTER — Encounter: Payer: Self-pay | Admitting: Family Medicine

## 2020-03-13 DIAGNOSIS — I25119 Atherosclerotic heart disease of native coronary artery with unspecified angina pectoris: Secondary | ICD-10-CM | POA: Diagnosis not present

## 2020-03-13 DIAGNOSIS — I10 Essential (primary) hypertension: Secondary | ICD-10-CM | POA: Diagnosis not present

## 2020-03-13 DIAGNOSIS — Z0000 Encounter for general adult medical examination without abnormal findings: Secondary | ICD-10-CM | POA: Diagnosis not present

## 2020-03-14 LAB — LIPID PANEL
Cholesterol: 108 mg/dL (ref <200)
HDL: 52 mg/dL (ref >=40)
LDL Cholesterol (Calc): 42 mg/dL (calc)
Non-HDL Cholesterol (Calc): 56 mg/dL (calc) (ref <130)
Total CHOL/HDL Ratio: 2.1 (calc) (ref <5.0)
Triglycerides: 55 mg/dL (ref <150)

## 2020-03-14 LAB — COMPLETE METABOLIC PANEL WITH GFR
AG Ratio: 2 (calc) (ref 1.0–2.5)
ALT: 17 U/L (ref 9–46)
AST: 18 U/L (ref 10–35)
Albumin: 4.2 g/dL (ref 3.6–5.1)
Alkaline phosphatase (APISO): 60 U/L (ref 35–144)
BUN: 17 mg/dL (ref 7–25)
CO2: 31 mmol/L (ref 20–32)
Calcium: 9.1 mg/dL (ref 8.6–10.3)
Chloride: 106 mmol/L (ref 98–110)
Creat: 1.04 mg/dL (ref 0.70–1.18)
GFR, Est African American: 80 mL/min/{1.73_m2} (ref >=60)
GFR, Est Non African American: 69 mL/min/{1.73_m2} (ref >=60)
Globulin: 2.1 g/dL (calc) (ref 1.9–3.7)
Glucose, Bld: 96 mg/dL (ref 65–99)
Potassium: 5 mmol/L (ref 3.5–5.3)
Sodium: 140 mmol/L (ref 135–146)
Total Bilirubin: 0.6 mg/dL (ref 0.2–1.2)
Total Protein: 6.3 g/dL (ref 6.1–8.1)

## 2020-03-14 LAB — TSH: TSH: 1.38 mIU/L (ref 0.40–4.50)

## 2020-03-14 LAB — CBC
HCT: 41.9 % (ref 38.5–50.0)
Hemoglobin: 13.4 g/dL (ref 13.2–17.1)
MCH: 30.1 pg (ref 27.0–33.0)
MCHC: 32 g/dL (ref 32.0–36.0)
MCV: 94.2 fL (ref 80.0–100.0)
MPV: 10.7 fL (ref 7.5–12.5)
Platelets: 188 10*3/uL (ref 140–400)
RBC: 4.45 10*6/uL (ref 4.20–5.80)
RDW: 12.3 % (ref 11.0–15.0)
WBC: 4.3 10*3/uL (ref 3.8–10.8)

## 2020-03-17 DIAGNOSIS — I252 Old myocardial infarction: Secondary | ICD-10-CM | POA: Diagnosis not present

## 2020-03-17 DIAGNOSIS — Z955 Presence of coronary angioplasty implant and graft: Secondary | ICD-10-CM | POA: Diagnosis not present

## 2020-03-17 DIAGNOSIS — Z48812 Encounter for surgical aftercare following surgery on the circulatory system: Secondary | ICD-10-CM | POA: Diagnosis not present

## 2020-03-19 DIAGNOSIS — I252 Old myocardial infarction: Secondary | ICD-10-CM | POA: Diagnosis not present

## 2020-03-19 DIAGNOSIS — Z955 Presence of coronary angioplasty implant and graft: Secondary | ICD-10-CM | POA: Diagnosis not present

## 2020-03-19 DIAGNOSIS — Z48812 Encounter for surgical aftercare following surgery on the circulatory system: Secondary | ICD-10-CM | POA: Diagnosis not present

## 2020-03-23 DIAGNOSIS — Z48812 Encounter for surgical aftercare following surgery on the circulatory system: Secondary | ICD-10-CM | POA: Diagnosis not present

## 2020-03-23 DIAGNOSIS — Z955 Presence of coronary angioplasty implant and graft: Secondary | ICD-10-CM | POA: Diagnosis not present

## 2020-03-23 DIAGNOSIS — I252 Old myocardial infarction: Secondary | ICD-10-CM | POA: Diagnosis not present

## 2020-03-24 DIAGNOSIS — Z955 Presence of coronary angioplasty implant and graft: Secondary | ICD-10-CM | POA: Diagnosis not present

## 2020-03-24 DIAGNOSIS — I252 Old myocardial infarction: Secondary | ICD-10-CM | POA: Diagnosis not present

## 2020-03-24 DIAGNOSIS — Z48812 Encounter for surgical aftercare following surgery on the circulatory system: Secondary | ICD-10-CM | POA: Diagnosis not present

## 2020-03-26 DIAGNOSIS — Z48812 Encounter for surgical aftercare following surgery on the circulatory system: Secondary | ICD-10-CM | POA: Diagnosis not present

## 2020-03-26 DIAGNOSIS — Z955 Presence of coronary angioplasty implant and graft: Secondary | ICD-10-CM | POA: Diagnosis not present

## 2020-03-26 DIAGNOSIS — I252 Old myocardial infarction: Secondary | ICD-10-CM | POA: Diagnosis not present

## 2020-03-30 DIAGNOSIS — I252 Old myocardial infarction: Secondary | ICD-10-CM | POA: Diagnosis not present

## 2020-03-30 DIAGNOSIS — Z955 Presence of coronary angioplasty implant and graft: Secondary | ICD-10-CM | POA: Diagnosis not present

## 2020-03-30 DIAGNOSIS — Z48812 Encounter for surgical aftercare following surgery on the circulatory system: Secondary | ICD-10-CM | POA: Diagnosis not present

## 2020-03-31 DIAGNOSIS — Z955 Presence of coronary angioplasty implant and graft: Secondary | ICD-10-CM | POA: Diagnosis not present

## 2020-03-31 DIAGNOSIS — Z48812 Encounter for surgical aftercare following surgery on the circulatory system: Secondary | ICD-10-CM | POA: Diagnosis not present

## 2020-03-31 DIAGNOSIS — I252 Old myocardial infarction: Secondary | ICD-10-CM | POA: Diagnosis not present

## 2020-04-06 DIAGNOSIS — Z955 Presence of coronary angioplasty implant and graft: Secondary | ICD-10-CM | POA: Diagnosis not present

## 2020-04-06 DIAGNOSIS — Z48812 Encounter for surgical aftercare following surgery on the circulatory system: Secondary | ICD-10-CM | POA: Diagnosis not present

## 2020-04-06 DIAGNOSIS — I252 Old myocardial infarction: Secondary | ICD-10-CM | POA: Diagnosis not present

## 2020-04-07 DIAGNOSIS — Z48812 Encounter for surgical aftercare following surgery on the circulatory system: Secondary | ICD-10-CM | POA: Diagnosis not present

## 2020-04-07 DIAGNOSIS — I252 Old myocardial infarction: Secondary | ICD-10-CM | POA: Diagnosis not present

## 2020-04-07 DIAGNOSIS — Z955 Presence of coronary angioplasty implant and graft: Secondary | ICD-10-CM | POA: Diagnosis not present

## 2020-04-09 DIAGNOSIS — Z48812 Encounter for surgical aftercare following surgery on the circulatory system: Secondary | ICD-10-CM | POA: Diagnosis not present

## 2020-04-09 DIAGNOSIS — I252 Old myocardial infarction: Secondary | ICD-10-CM | POA: Diagnosis not present

## 2020-04-09 DIAGNOSIS — Z87891 Personal history of nicotine dependence: Secondary | ICD-10-CM | POA: Diagnosis not present

## 2020-04-09 DIAGNOSIS — Z955 Presence of coronary angioplasty implant and graft: Secondary | ICD-10-CM | POA: Diagnosis not present

## 2020-04-13 DIAGNOSIS — Z48812 Encounter for surgical aftercare following surgery on the circulatory system: Secondary | ICD-10-CM | POA: Diagnosis not present

## 2020-04-13 DIAGNOSIS — I252 Old myocardial infarction: Secondary | ICD-10-CM | POA: Diagnosis not present

## 2020-04-13 DIAGNOSIS — Z955 Presence of coronary angioplasty implant and graft: Secondary | ICD-10-CM | POA: Diagnosis not present

## 2020-04-13 DIAGNOSIS — Z87891 Personal history of nicotine dependence: Secondary | ICD-10-CM | POA: Diagnosis not present

## 2020-04-14 DIAGNOSIS — Z955 Presence of coronary angioplasty implant and graft: Secondary | ICD-10-CM | POA: Diagnosis not present

## 2020-04-14 DIAGNOSIS — I252 Old myocardial infarction: Secondary | ICD-10-CM | POA: Diagnosis not present

## 2020-04-14 DIAGNOSIS — Z87891 Personal history of nicotine dependence: Secondary | ICD-10-CM | POA: Diagnosis not present

## 2020-04-14 DIAGNOSIS — Z48812 Encounter for surgical aftercare following surgery on the circulatory system: Secondary | ICD-10-CM | POA: Diagnosis not present

## 2020-04-15 DIAGNOSIS — I2510 Atherosclerotic heart disease of native coronary artery without angina pectoris: Secondary | ICD-10-CM | POA: Diagnosis not present

## 2020-04-16 DIAGNOSIS — Z87891 Personal history of nicotine dependence: Secondary | ICD-10-CM | POA: Diagnosis not present

## 2020-04-16 DIAGNOSIS — I252 Old myocardial infarction: Secondary | ICD-10-CM | POA: Diagnosis not present

## 2020-04-16 DIAGNOSIS — Z48812 Encounter for surgical aftercare following surgery on the circulatory system: Secondary | ICD-10-CM | POA: Diagnosis not present

## 2020-04-16 DIAGNOSIS — Z955 Presence of coronary angioplasty implant and graft: Secondary | ICD-10-CM | POA: Diagnosis not present

## 2020-04-20 DIAGNOSIS — Z87891 Personal history of nicotine dependence: Secondary | ICD-10-CM | POA: Diagnosis not present

## 2020-04-20 DIAGNOSIS — Z955 Presence of coronary angioplasty implant and graft: Secondary | ICD-10-CM | POA: Diagnosis not present

## 2020-04-20 DIAGNOSIS — I252 Old myocardial infarction: Secondary | ICD-10-CM | POA: Diagnosis not present

## 2020-04-20 DIAGNOSIS — Z48812 Encounter for surgical aftercare following surgery on the circulatory system: Secondary | ICD-10-CM | POA: Diagnosis not present

## 2020-04-21 DIAGNOSIS — Z955 Presence of coronary angioplasty implant and graft: Secondary | ICD-10-CM | POA: Diagnosis not present

## 2020-04-21 DIAGNOSIS — J449 Chronic obstructive pulmonary disease, unspecified: Secondary | ICD-10-CM | POA: Diagnosis not present

## 2020-04-21 DIAGNOSIS — I252 Old myocardial infarction: Secondary | ICD-10-CM | POA: Diagnosis not present

## 2020-04-21 DIAGNOSIS — Z48812 Encounter for surgical aftercare following surgery on the circulatory system: Secondary | ICD-10-CM | POA: Diagnosis not present

## 2020-04-21 DIAGNOSIS — Z87891 Personal history of nicotine dependence: Secondary | ICD-10-CM | POA: Diagnosis not present

## 2020-04-23 DIAGNOSIS — Z48812 Encounter for surgical aftercare following surgery on the circulatory system: Secondary | ICD-10-CM | POA: Diagnosis not present

## 2020-04-23 DIAGNOSIS — Z87891 Personal history of nicotine dependence: Secondary | ICD-10-CM | POA: Diagnosis not present

## 2020-04-23 DIAGNOSIS — I252 Old myocardial infarction: Secondary | ICD-10-CM | POA: Diagnosis not present

## 2020-04-23 DIAGNOSIS — Z955 Presence of coronary angioplasty implant and graft: Secondary | ICD-10-CM | POA: Diagnosis not present

## 2020-04-24 DIAGNOSIS — I472 Ventricular tachycardia: Secondary | ICD-10-CM | POA: Diagnosis not present

## 2020-04-24 DIAGNOSIS — Z95810 Presence of automatic (implantable) cardiac defibrillator: Secondary | ICD-10-CM | POA: Diagnosis not present

## 2020-04-24 DIAGNOSIS — I25119 Atherosclerotic heart disease of native coronary artery with unspecified angina pectoris: Secondary | ICD-10-CM | POA: Diagnosis not present

## 2020-04-27 DIAGNOSIS — Z48812 Encounter for surgical aftercare following surgery on the circulatory system: Secondary | ICD-10-CM | POA: Diagnosis not present

## 2020-04-27 DIAGNOSIS — I252 Old myocardial infarction: Secondary | ICD-10-CM | POA: Diagnosis not present

## 2020-04-27 DIAGNOSIS — Z955 Presence of coronary angioplasty implant and graft: Secondary | ICD-10-CM | POA: Diagnosis not present

## 2020-04-27 DIAGNOSIS — Z87891 Personal history of nicotine dependence: Secondary | ICD-10-CM | POA: Diagnosis not present

## 2020-04-28 DIAGNOSIS — Z87891 Personal history of nicotine dependence: Secondary | ICD-10-CM | POA: Diagnosis not present

## 2020-04-28 DIAGNOSIS — Z48812 Encounter for surgical aftercare following surgery on the circulatory system: Secondary | ICD-10-CM | POA: Diagnosis not present

## 2020-04-28 DIAGNOSIS — Z955 Presence of coronary angioplasty implant and graft: Secondary | ICD-10-CM | POA: Diagnosis not present

## 2020-04-28 DIAGNOSIS — I252 Old myocardial infarction: Secondary | ICD-10-CM | POA: Diagnosis not present

## 2020-04-30 DIAGNOSIS — Z87891 Personal history of nicotine dependence: Secondary | ICD-10-CM | POA: Diagnosis not present

## 2020-04-30 DIAGNOSIS — Z48812 Encounter for surgical aftercare following surgery on the circulatory system: Secondary | ICD-10-CM | POA: Diagnosis not present

## 2020-04-30 DIAGNOSIS — I252 Old myocardial infarction: Secondary | ICD-10-CM | POA: Diagnosis not present

## 2020-04-30 DIAGNOSIS — Z955 Presence of coronary angioplasty implant and graft: Secondary | ICD-10-CM | POA: Diagnosis not present

## 2020-05-04 DIAGNOSIS — Z955 Presence of coronary angioplasty implant and graft: Secondary | ICD-10-CM | POA: Diagnosis not present

## 2020-05-04 DIAGNOSIS — Z87891 Personal history of nicotine dependence: Secondary | ICD-10-CM | POA: Diagnosis not present

## 2020-05-04 DIAGNOSIS — Z48812 Encounter for surgical aftercare following surgery on the circulatory system: Secondary | ICD-10-CM | POA: Diagnosis not present

## 2020-05-04 DIAGNOSIS — I252 Old myocardial infarction: Secondary | ICD-10-CM | POA: Diagnosis not present

## 2020-05-05 DIAGNOSIS — I252 Old myocardial infarction: Secondary | ICD-10-CM | POA: Diagnosis not present

## 2020-05-05 DIAGNOSIS — Z48812 Encounter for surgical aftercare following surgery on the circulatory system: Secondary | ICD-10-CM | POA: Diagnosis not present

## 2020-05-05 DIAGNOSIS — Z955 Presence of coronary angioplasty implant and graft: Secondary | ICD-10-CM | POA: Diagnosis not present

## 2020-05-05 DIAGNOSIS — Z87891 Personal history of nicotine dependence: Secondary | ICD-10-CM | POA: Diagnosis not present

## 2020-05-07 DIAGNOSIS — I252 Old myocardial infarction: Secondary | ICD-10-CM | POA: Diagnosis not present

## 2020-05-07 DIAGNOSIS — Z955 Presence of coronary angioplasty implant and graft: Secondary | ICD-10-CM | POA: Diagnosis not present

## 2020-05-07 DIAGNOSIS — Z87891 Personal history of nicotine dependence: Secondary | ICD-10-CM | POA: Diagnosis not present

## 2020-05-07 DIAGNOSIS — Z48812 Encounter for surgical aftercare following surgery on the circulatory system: Secondary | ICD-10-CM | POA: Diagnosis not present

## 2020-05-12 DIAGNOSIS — Z955 Presence of coronary angioplasty implant and graft: Secondary | ICD-10-CM | POA: Diagnosis not present

## 2020-05-12 DIAGNOSIS — I252 Old myocardial infarction: Secondary | ICD-10-CM | POA: Diagnosis not present

## 2020-05-12 DIAGNOSIS — Z48812 Encounter for surgical aftercare following surgery on the circulatory system: Secondary | ICD-10-CM | POA: Diagnosis not present

## 2020-05-12 DIAGNOSIS — Z87891 Personal history of nicotine dependence: Secondary | ICD-10-CM | POA: Diagnosis not present

## 2020-05-13 ENCOUNTER — Encounter: Payer: Self-pay | Admitting: Family Medicine

## 2020-05-14 ENCOUNTER — Telehealth (INDEPENDENT_AMBULATORY_CARE_PROVIDER_SITE_OTHER): Payer: Medicare Other | Admitting: Family Medicine

## 2020-05-14 ENCOUNTER — Encounter: Payer: Self-pay | Admitting: Family Medicine

## 2020-05-14 VITALS — BP 130/84 | HR 70 | Wt 162.0 lb

## 2020-05-14 DIAGNOSIS — J441 Chronic obstructive pulmonary disease with (acute) exacerbation: Secondary | ICD-10-CM

## 2020-05-14 DIAGNOSIS — Z20822 Contact with and (suspected) exposure to covid-19: Secondary | ICD-10-CM | POA: Diagnosis not present

## 2020-05-14 LAB — POCT INFLUENZA A/B
Influenza A, POC: NEGATIVE
Influenza B, POC: NEGATIVE

## 2020-05-14 MED ORDER — DOXYCYCLINE HYCLATE 100 MG PO TABS
100.0000 mg | ORAL_TABLET | Freq: Two times a day (BID) | ORAL | 0 refills | Status: DC
Start: 2020-05-14 — End: 2020-07-13

## 2020-05-14 MED ORDER — PREDNISONE 20 MG PO TABS: 40.0000 mg | ORAL_TABLET | Freq: Every day | ORAL | 0 refills | Status: DC

## 2020-05-14 NOTE — Progress Notes (Signed)
Virtual Visit via telephone note  I connected with Zachary Waters on 05/14/20 at  3:20 PM EST by telephone verified that I am speaking with the correct person using two identifiers.   I discussed the limitations of evaluation and management by telemedicine and the availability of in person appointments. The patient expressed understanding and agreed to proceed.  Patient location: at home  Provider location: in office  Subjective:    CC: SOB  HPI: Sxs started 4 days ago on Monday.  Pt stated he has shortness of breath, runny nose, congestion, and coughing. It  started on Monday he say it's clearing up. Hx of COPD.  Usually gets this every year around this time. Went to his cardio rehab on Tuesday and felt exhausted. Felt bad enough he canceled. Did have some diarrhea this AM.  No fever, chills or sweats.  Trying to stay hydrated. Started doing neb Q6 hours last night.     Past medical history, Surgical history, Family history not pertinant except as noted below, Social history, Allergies, and medications have been entered into the medical record, reviewed, and corrections made.   Review of Systems: No fevers, chills, night sweats, weight loss, chest pain, or shortness of breath.   Objective:    General: Speaking clearly in complete sentences without any shortness of breath.  Alert and oriented x3.  Normal judgment. No apparent acute distress.    Impression and Recommendations:    No problem-specific Assessment & Plan notes found for this encounter.  Upper respiratory infection with COPD exacerbation-discussed options I do recommend that he get tested for COVID fortunately he has been afebrile.  He does feel like this is typical COPD flare this time a year we will go ahead and treat with antibiotics and prednisone.  Also continue to use nebulizers every 4-6 hours as needed and taper as improving.  If he suddenly gets worse recommend more urgent care/ED visit for further work-up.   We will also have him do a flu swab as well.  Patient drove by this afternoon so that we could perform a PCR COVID swab as well as a rapid flu test.  Fortunately flu swab was negative still awaiting PCR testing.   Time spent in encounter 22 minutes  I discussed the assessment and treatment plan with the patient. The patient was provided an opportunity to ask questions and all were answered. The patient agreed with the plan and demonstrated an understanding of the instructions.   The patient was advised to call back or seek an in-person evaluation if the symptoms worsen or if the condition fails to improve as anticipated.   Nani Gasser, MD

## 2020-05-14 NOTE — Progress Notes (Signed)
Pt stated he has shortness of breath runny nose, congestion and coughing. It  started on Monday he say it's clearing up he ask if you can call

## 2020-05-15 ENCOUNTER — Encounter: Payer: Self-pay | Admitting: Family Medicine

## 2020-05-20 ENCOUNTER — Encounter: Payer: Self-pay | Admitting: Family Medicine

## 2020-05-20 LAB — NOVEL CORONAVIRUS, NAA: SARS-CoV-2, NAA: NOT DETECTED

## 2020-05-22 ENCOUNTER — Encounter: Payer: Self-pay | Admitting: Family Medicine

## 2020-05-22 ENCOUNTER — Telehealth (INDEPENDENT_AMBULATORY_CARE_PROVIDER_SITE_OTHER): Payer: Medicare Other | Admitting: Family Medicine

## 2020-05-22 DIAGNOSIS — J441 Chronic obstructive pulmonary disease with (acute) exacerbation: Secondary | ICD-10-CM

## 2020-05-22 MED ORDER — PREDNISONE 20 MG PO TABS: ORAL_TABLET | ORAL | 0 refills | Status: AC

## 2020-05-22 NOTE — Progress Notes (Signed)
Virtual Visit via Telephone Note  I connected with Zachary Waters on 05/22/20 at 11:10 AM EST by telephone and verified that I am speaking with the correct person using two identifiers.   I discussed the limitations, risks, security and privacy concerns of performing an evaluation and management service by telephone and the availability of in person appointments. I also discussed with the patient that there may be a patient responsible charge related to this service. The patient expressed understanding and agreed to proceed.  Patient location: at home Provider loccation: In office   Subjective:    CC: Cough  HPI: Pt reports SOB,slight cough. Taking pcn this will end on Monday. He wondered if he would have stayed on the prednisone a little longer if this would have helped. He also said that he was doing the cardic rehab and wasn't able to do it for the past week due to his breathing issues. He asked if he should restart the cardiac rehab next week or just chill out for another week? He stated his best day was Tuesday. He did more walking around and Wednesday he went down. And has been just taking it easy.Cough is better mostly just the SOB. His breathing is better today. Doing nebulizer treatments 3x a day.   Past medical history, Surgical history, Family history not pertinant except as noted below, Social history, Allergies, and medications have been entered into the medical record, reviewed, and corrections made.   Review of Systems: No fevers, chills, night sweats, weight loss, chest pain, or shortness of breath.   Objective:    General: Speaking clearly in complete sentences without any shortness of breath.  Alert and oriented x3.  Normal judgment. No apparent acute distress.    Impression and Recommendations:    COPD exacerbation-we have treated him with steroids and antibiotics he completed his steroids on Tuesday and says that really was his best day but since coming off the  steroids he is felt like he has had a decline though he does feel a little bit better today.  So we discussed giving him another round of prednisone with a taper.  Certainly if at any point he feels like he is actually going backwards again then I would like to see him or maybe even consider chest x-ray for further work-up if needed.  He will complete his antibiotics by Monday.  Again we can always consider changing that if we need to if he is not doing well.  He is going to stay out of cardiac rehab for an additional week to give himself some time to recover and especially with omicron surge right now I think that that would actually be prudent.    I discussed the assessment and treatment plan with the patient. The patient was provided an opportunity to ask questions and all were answered. The patient agreed with the plan and demonstrated an understanding of the instructions.   The patient was advised to call back or seek an in-person evaluation if the symptoms worsen or if the condition fails to improve as anticipated.  I provided 15 minutes of non-face-to-face time during this encounter.   Beatrice Lecher, MD

## 2020-05-22 NOTE — Telephone Encounter (Signed)
Will schedule

## 2020-05-22 NOTE — Progress Notes (Signed)
Pt reports SOB,slight cough. Taking pcn this will end on Monday. He wondered if he would have stayed on the prednisone a little longer if this would have helped. He also said that he was doing the cardic rehab and wasn't able to do it for the past week due to his breathing issues. He asked if he should restart the cardiac rehab next week or just chill out for another week?  He stated his best day was Tuesday. He did more walking around and Wednesday he went down. And has been just taking it easy.  His breathing is better today. Doing nebulizer treatments 3x a day.

## 2020-05-25 DIAGNOSIS — I472 Ventricular tachycardia: Secondary | ICD-10-CM | POA: Diagnosis not present

## 2020-06-01 DIAGNOSIS — Z48812 Encounter for surgical aftercare following surgery on the circulatory system: Secondary | ICD-10-CM | POA: Diagnosis not present

## 2020-06-01 DIAGNOSIS — Z955 Presence of coronary angioplasty implant and graft: Secondary | ICD-10-CM | POA: Diagnosis not present

## 2020-06-01 DIAGNOSIS — I252 Old myocardial infarction: Secondary | ICD-10-CM | POA: Diagnosis not present

## 2020-06-01 DIAGNOSIS — Z87891 Personal history of nicotine dependence: Secondary | ICD-10-CM | POA: Diagnosis not present

## 2020-06-02 DIAGNOSIS — Z48812 Encounter for surgical aftercare following surgery on the circulatory system: Secondary | ICD-10-CM | POA: Diagnosis not present

## 2020-06-02 DIAGNOSIS — Z955 Presence of coronary angioplasty implant and graft: Secondary | ICD-10-CM | POA: Diagnosis not present

## 2020-06-02 DIAGNOSIS — Z87891 Personal history of nicotine dependence: Secondary | ICD-10-CM | POA: Diagnosis not present

## 2020-06-02 DIAGNOSIS — I252 Old myocardial infarction: Secondary | ICD-10-CM | POA: Diagnosis not present

## 2020-06-04 DIAGNOSIS — I252 Old myocardial infarction: Secondary | ICD-10-CM | POA: Diagnosis not present

## 2020-06-04 DIAGNOSIS — Z955 Presence of coronary angioplasty implant and graft: Secondary | ICD-10-CM | POA: Diagnosis not present

## 2020-06-04 DIAGNOSIS — Z48812 Encounter for surgical aftercare following surgery on the circulatory system: Secondary | ICD-10-CM | POA: Diagnosis not present

## 2020-06-04 DIAGNOSIS — Z87891 Personal history of nicotine dependence: Secondary | ICD-10-CM | POA: Diagnosis not present

## 2020-06-09 DIAGNOSIS — Z955 Presence of coronary angioplasty implant and graft: Secondary | ICD-10-CM | POA: Diagnosis not present

## 2020-06-09 DIAGNOSIS — Z48812 Encounter for surgical aftercare following surgery on the circulatory system: Secondary | ICD-10-CM | POA: Diagnosis not present

## 2020-06-09 DIAGNOSIS — I252 Old myocardial infarction: Secondary | ICD-10-CM | POA: Diagnosis not present

## 2020-06-10 ENCOUNTER — Encounter: Payer: Self-pay | Admitting: Family Medicine

## 2020-06-10 IMAGING — CT CT ANGIO CHEST
2 of 9 series · 18 of 36 positions shown · IV contrast (iopamidol)
Comparison: 07/05/2018; 07/30/2015

CLINICAL DATA: Shortness of breath for the past 2 weeks. Elevated
D-dimer. Evaluate for pulmonary embolism.

EXAM:
CT ANGIOGRAPHY CHEST WITH CONTRAST
TECHNIQUE: Multidetector CT imaging of the chest was performed using the
standard protocol during bolus administration of intravenous
contrast. Multiplanar CT image reconstructions and MIPs were
obtained to evaluate the vascular anatomy.
CONTRAST:  100mL ALYVID-BM2 IOPAMIDOL (ALYVID-BM2) INJECTION 76%

[Series 7: pe coronal mpr · coronal · 0.62mm/px · 1 of 123 slices shown]
[im 62/123  mediastinal]
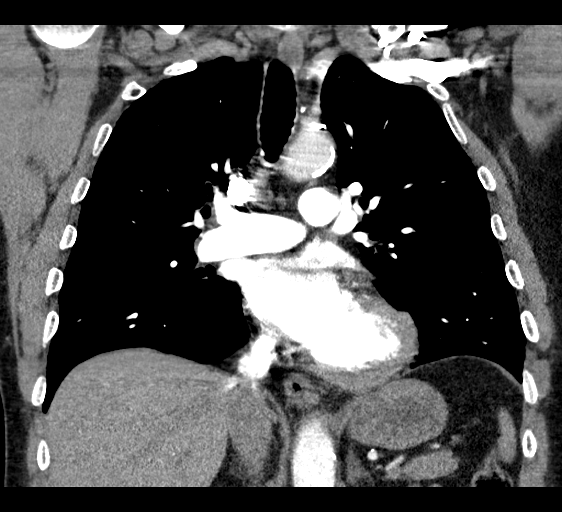

[Series 11: pe thins · axial · 0.70mm/px · z∈[-299,-44]mm · 17 of 287 slices shown]
[im 16/287  lung]
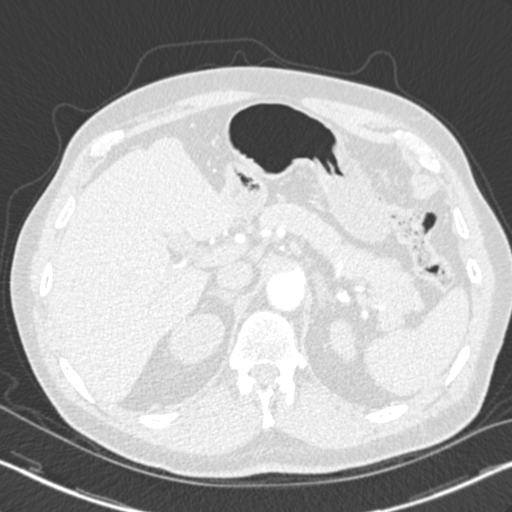
[im 31/287  mediastinal]
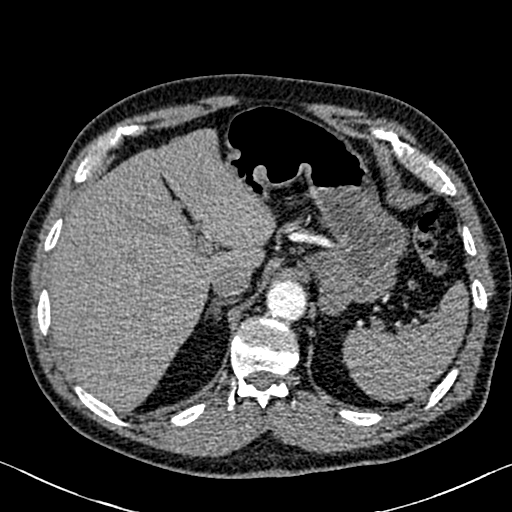
[im 46/287  lung]
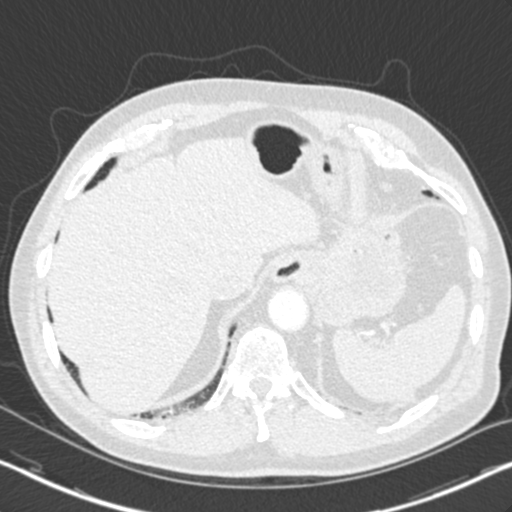
[im 61/287  mediastinal]
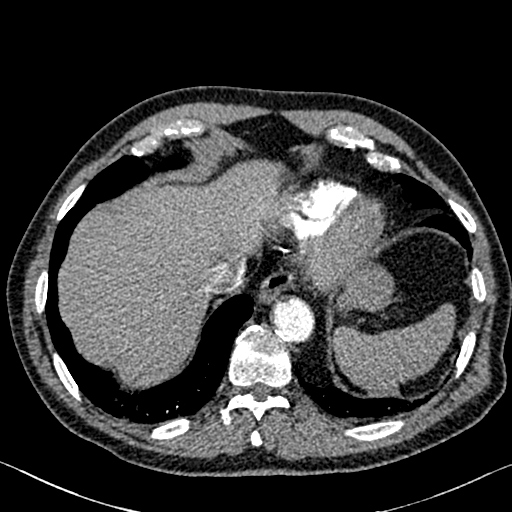
[im 76/287  lung]
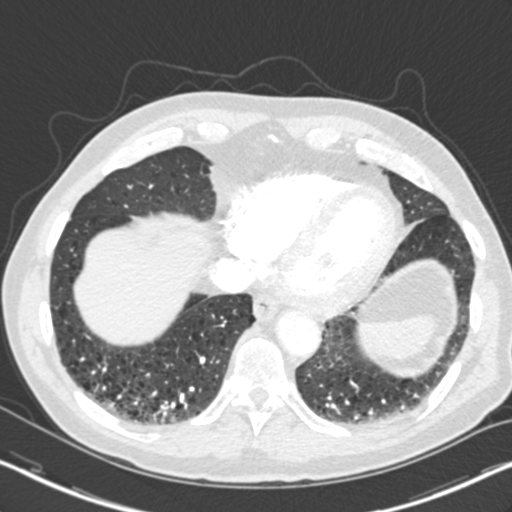
[im 91/287  mediastinal]
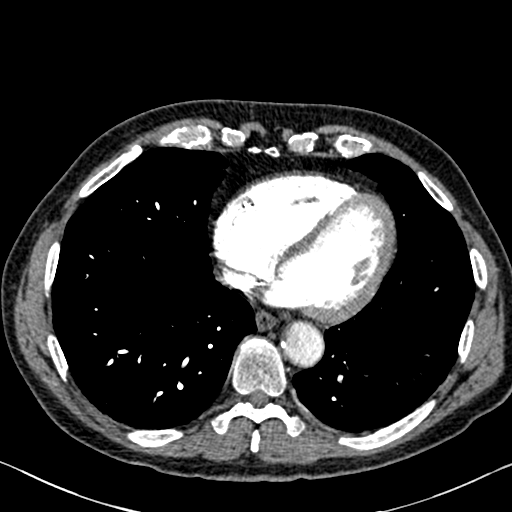
[im 106/287  lung]
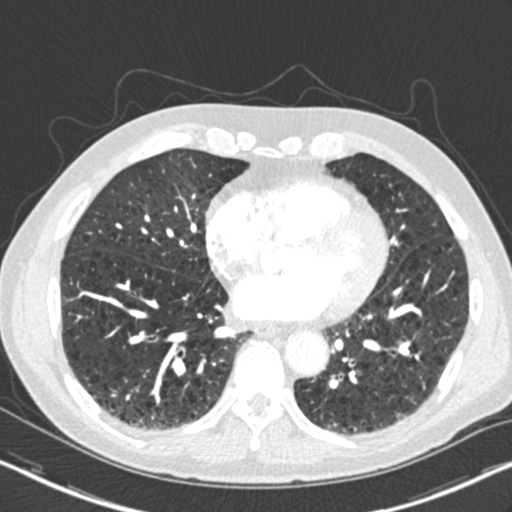
[im 121/287  mediastinal]
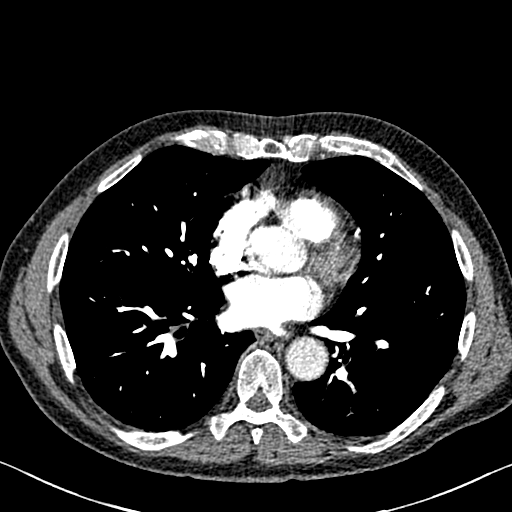
[im 151/287  lung]
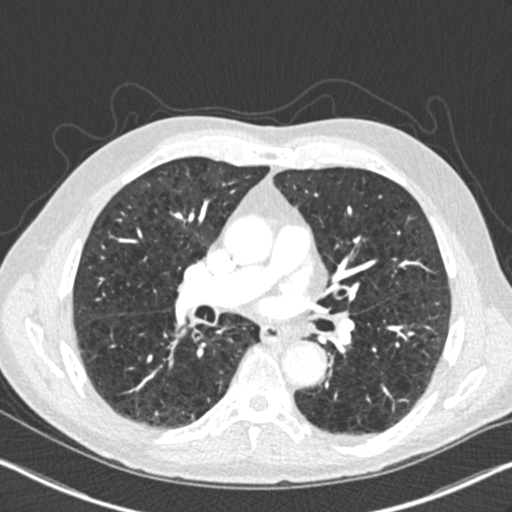
[im 166/287  mediastinal]
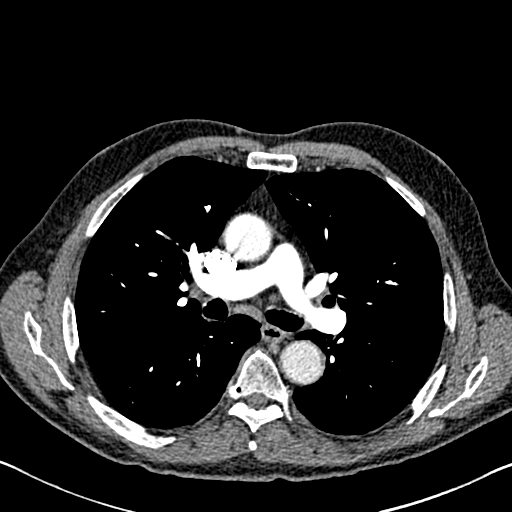
[im 181/287  lung]
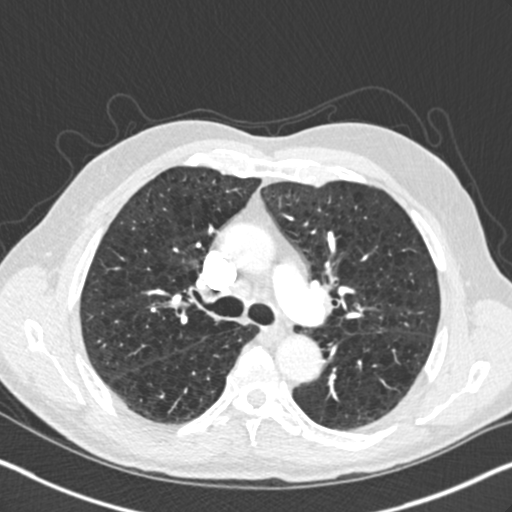
[im 196/287  mediastinal]
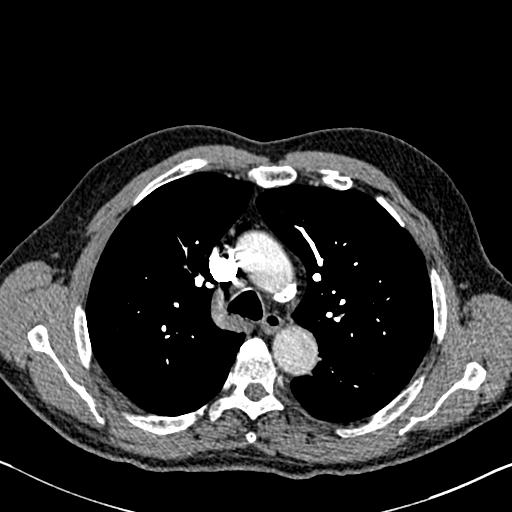
[im 211/287  lung]
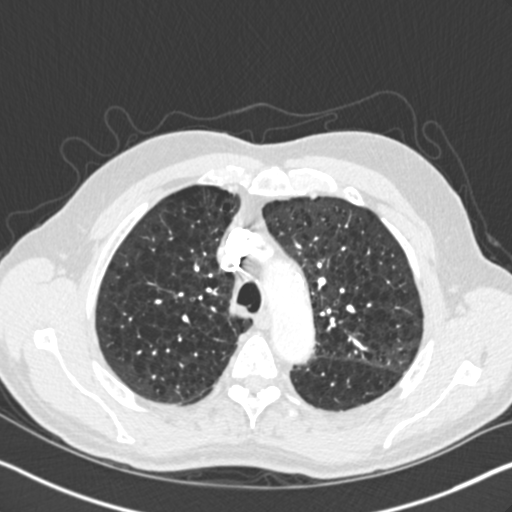
[im 226/287  mediastinal]
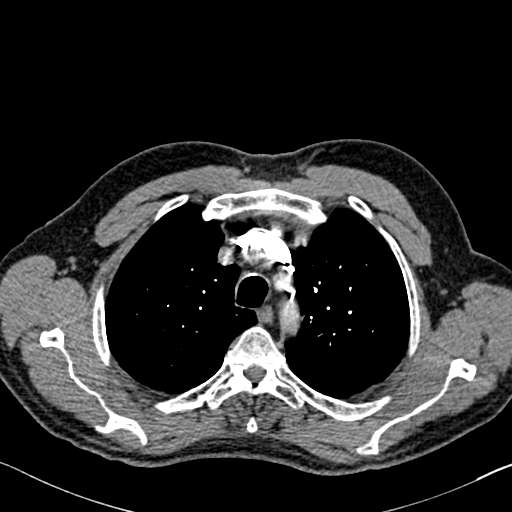
[im 241/287  lung]
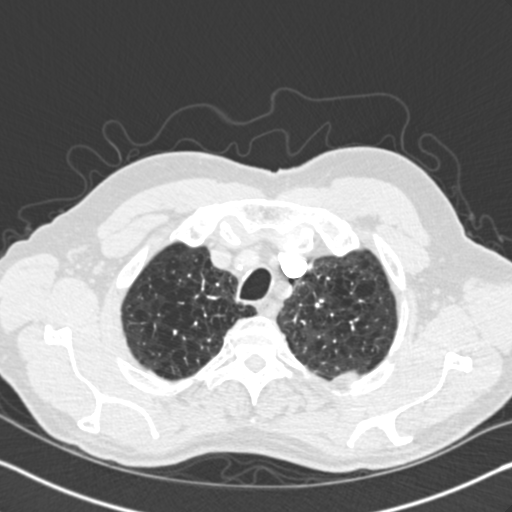
[im 256/287  mediastinal]
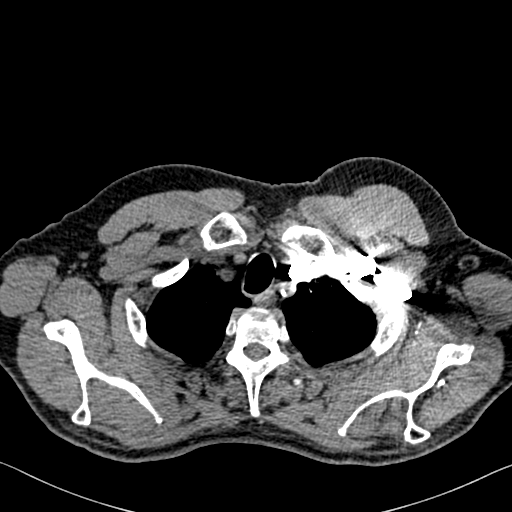
[im 271/287  lung]
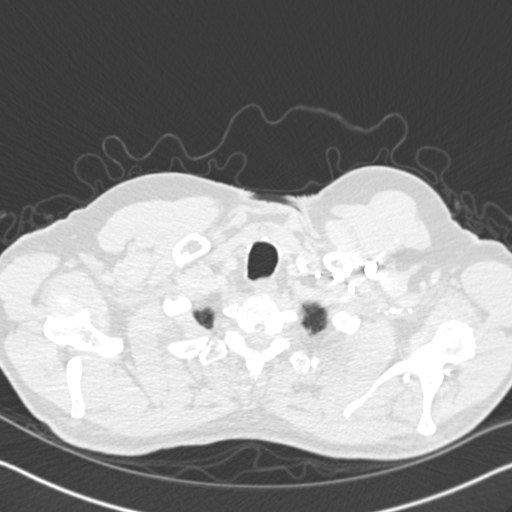

[18 of 36 positions shown; findings below may reference images not displayed]

FINDINGS: Vascular Findings:

There is adequate opacification of the pulmonary arterial system
with the main pulmonary artery measuring 430 Hounsfield units. There
are no discrete filling defects within the pulmonary arterial tree
to suggest pulmonary embolism. Normal caliber of the main pulmonary
artery.

Normal heart size. Coronary artery calcifications. No pericardial
effusion.

Scattered atherosclerotic plaque within a normal caliber thoracic
aorta. Conventional configuration of the aortic arch. The branch
vessels of the aortic arch appear widely patent throughout their
imaged courses.

Review of the MIP images confirms the above findings.

----------------------------------------------------------------------------------

Nonvascular Findings:

Mediastinum/Lymph Nodes: Solitary borderline enlarged pretracheal
lymph node measures 1.1 cm in greatest short axis diameter though
appears to maintain a fatty hilum (images 36 and 37, series 5),
presumably reactive in etiology given advanced emphysematous change
within the lungs. No bulky mediastinal, hilar or axillary
lymphadenopathy.

Lungs/Pleura: Severe apical predominant centrilobular emphysematous
change. Minimal dependent subpleural ground-glass atelectasis. No
discrete focal airspace opacities. No pleural effusion or
pneumothorax. There is a minimal amount of nonocclusive debris noted
within the right interlobar (image 40, series 6) and lower lobe
bronchi (image 51). The central pulmonary airways appear otherwise
patent.

No discrete pulmonary nodules.

Upper abdomen: Limited early arterial phase evaluation of the upper
abdomen is normal.

Musculoskeletal: No acute or aggressive osseous abnormalities.
Stigmata of DISH within the thoracic spine. Regional soft tissues
appear normal.
IMPRESSION: 1. No acute cardiopulmonary disease. Specifically, no evidence of
pulmonary embolism.
2. Severe emphysematous change.  Emphysema (5JRMU-MA4.S).
3. Coronary artery calcifications. Aortic Atherosclerosis
(5JRMU-UHC.C).

## 2020-06-11 DIAGNOSIS — Z48812 Encounter for surgical aftercare following surgery on the circulatory system: Secondary | ICD-10-CM | POA: Diagnosis not present

## 2020-06-11 DIAGNOSIS — I252 Old myocardial infarction: Secondary | ICD-10-CM | POA: Diagnosis not present

## 2020-06-11 DIAGNOSIS — Z955 Presence of coronary angioplasty implant and graft: Secondary | ICD-10-CM | POA: Diagnosis not present

## 2020-06-15 DIAGNOSIS — Z48812 Encounter for surgical aftercare following surgery on the circulatory system: Secondary | ICD-10-CM | POA: Diagnosis not present

## 2020-06-15 DIAGNOSIS — Z955 Presence of coronary angioplasty implant and graft: Secondary | ICD-10-CM | POA: Diagnosis not present

## 2020-06-15 DIAGNOSIS — I252 Old myocardial infarction: Secondary | ICD-10-CM | POA: Diagnosis not present

## 2020-06-16 DIAGNOSIS — I252 Old myocardial infarction: Secondary | ICD-10-CM | POA: Diagnosis not present

## 2020-06-16 DIAGNOSIS — Z955 Presence of coronary angioplasty implant and graft: Secondary | ICD-10-CM | POA: Diagnosis not present

## 2020-06-16 DIAGNOSIS — Z48812 Encounter for surgical aftercare following surgery on the circulatory system: Secondary | ICD-10-CM | POA: Diagnosis not present

## 2020-07-10 DIAGNOSIS — R0602 Shortness of breath: Secondary | ICD-10-CM | POA: Diagnosis not present

## 2020-07-10 DIAGNOSIS — Z20822 Contact with and (suspected) exposure to covid-19: Secondary | ICD-10-CM | POA: Diagnosis not present

## 2020-07-10 DIAGNOSIS — Z95810 Presence of automatic (implantable) cardiac defibrillator: Secondary | ICD-10-CM | POA: Diagnosis not present

## 2020-07-10 DIAGNOSIS — I25119 Atherosclerotic heart disease of native coronary artery with unspecified angina pectoris: Secondary | ICD-10-CM | POA: Diagnosis not present

## 2020-07-10 DIAGNOSIS — Z03818 Encounter for observation for suspected exposure to other biological agents ruled out: Secondary | ICD-10-CM | POA: Diagnosis not present

## 2020-07-11 ENCOUNTER — Other Ambulatory Visit: Payer: Self-pay | Admitting: Family Medicine

## 2020-07-13 ENCOUNTER — Other Ambulatory Visit: Payer: Self-pay

## 2020-07-13 ENCOUNTER — Encounter: Payer: Self-pay | Admitting: Family Medicine

## 2020-07-13 ENCOUNTER — Ambulatory Visit (INDEPENDENT_AMBULATORY_CARE_PROVIDER_SITE_OTHER): Payer: Medicare Other | Admitting: Family Medicine

## 2020-07-13 VITALS — BP 154/66 | HR 63 | Ht 69.0 in | Wt 165.0 lb

## 2020-07-13 DIAGNOSIS — J441 Chronic obstructive pulmonary disease with (acute) exacerbation: Secondary | ICD-10-CM | POA: Diagnosis not present

## 2020-07-13 MED ORDER — DOXYCYCLINE HYCLATE 100 MG PO TABS: 100.0000 mg | ORAL_TABLET | Freq: Two times a day (BID) | ORAL | 0 refills | Status: DC

## 2020-07-13 MED ORDER — PREDNISONE 20 MG PO TABS: ORAL_TABLET | ORAL | 0 refills | Status: AC

## 2020-07-13 NOTE — Progress Notes (Signed)
Acute Office Visit  Subjective:    Patient ID: Zachary Waters, male    DOB: 07-Jun-1940, 80 y.o.   MRN: [id number]  Chief Complaint  Patient presents with  . COPD    HPI Patient is in today for SOB.  Last Monday he went golfing and felt fine but by next day he started not feeling well. On Tuesday cough, congestion and runny nose and some chills.  He went to the [place] and had a neg rapid and PCR COVID test. Next f/u with Pulm is this summer.  Has started using his oxygen with walking. Last COPD exacerbation was early January.  He still has a productive cough and some slight runny nose.  No fever.  He still feeling very winded and short of breath even with minimal activity.  So had a chest x-ray and some lab work done at [facility].  Past Medical History:  Diagnosis Date  . Cervical spondylosis with radiculopathy 03/08/2012  . Cervical spondylosis with radiculopathy   . COPD (chronic obstructive pulmonary disease) ([place])   . Dyspnea   . Essential hypertension   . GERD (gastroesophageal reflux disease) once in a great while  . Hyperlipidemia 02/02/2018  . Pneumonia 10/2009  . Thyroid disease     Past Surgical History:  Procedure Laterality Date  . COLONOSCOPY    . HERNIA REPAIR     inguinal hernia repair on right and left  . INGUINAL HERNIA REPAIR Bilateral 04/21/2016   Procedure: LAPAROSCOPIC LEFT INGUINAL HERNIA REPAIR AND RIGHT FEMORAL HERNIA REPAIR;  Surgeon: Michael Boston, MD;  Location: [facility];  Service: General;  Laterality: Bilateral;  . INSERTION OF MESH Bilateral 04/21/2016   Procedure: INSERTION OF MESH LEFT INGUINAL HERNIA  AND INSERTION OF MESH RIGHT FEMORAL HERNIA;  Surgeon: Michael Boston, MD;  Location: [facility];  Service: General;  Laterality: Bilateral;  . LEG SURGERY Right    tumor removed as a child from femur?  . ROTATOR CUFF REPAIR  2005   Left  . TONSILLECTOMY      Family History  Problem Relation Age of Onset  . Prostate cancer Father 32  . Heart disease  Mother 36       deceased age 57  . Depression Brother        Commited suicide  . Depression Brother     Social History   Socioeconomic History  . Marital status: Married    Spouse name: [name]  . Number of children: 2  . Years of education: 24  . Highest education level: Associate degree: academic program  Occupational History  . Occupation: retired    Comment: Producer, television/film/video  Tobacco Use  . Smoking status: Former Smoker    Types: Pipe    Quit date: 10/07/2009    Years since quitting: 10.7  . Smokeless tobacco: Never Used  Vaping Use  . Vaping Use: Never used  Substance and Sexual Activity  . Alcohol use: Yes    Alcohol/week: 1.0 standard drink    Types: 1 Glasses of wine per week    Comment: occasional  . Drug use: No  . Sexual activity: Never  Other Topics Concern  . Not on file  Social History Narrative   Power walks 3 days per week for 2 miles. Golfs twice a week.    Social Determinants of Health   Financial Resource Strain: Not on file  Food Insecurity: Not on file  Transportation Needs: Not on file  Physical Activity: Not on file  Stress: Not on file  Social Connections: Not on file  Intimate Partner Violence: Not on file    Outpatient Medications Prior to Visit  Medication Sig Dispense Refill  . albuterol (PROVENTIL) (5 MG/ML) 0.5% nebulizer solution Inhale into the lungs.    Marland Kitchen albuterol (VENTOLIN HFA) 108 (90 Base) MCG/ACT inhaler Inhale 2 puffs into the lungs every 6 (six) hours as needed.    . Ascorbic Acid (VITAMIN C) 1000 MG tablet Take 2,000 mg by mouth daily.    Marland Kitchen aspirin EC 81 MG tablet Take 81 mg by mouth daily.    Marland Kitchen atorvastatin (LIPITOR) 80 MG tablet Take 80 mg by mouth daily.  3  . budesonide-formoterol (SYMBICORT) 160-4.5 MCG/ACT inhaler Inhale 2 puffs into the lungs 2 (two) times daily.    . carvedilol (COREG) 6.25 MG tablet Take 6.25 mg by mouth 2 (two) times daily.    . clopidogrel (PLAVIX) 75 MG tablet Take 75 mg by mouth daily.  9  .  levothyroxine (SYNTHROID) 112 MCG tablet TAKE 1 TABLET BY MOUTH EVERY DAY BEFORE BREAKFAST 90 tablet 1  . lisinopril (ZESTRIL) 10 MG tablet Take 10 mg by mouth daily.  3  . Omega-3 Fatty Acids (FISH OIL) 1000 MG CAPS Take 1 capsule by mouth daily.    Marland Kitchen QUERCETIN PO Take 2 tablets by mouth daily. Pt reports taking 1 tablet daily    . tiotropium (SPIRIVA HANDIHALER) 18 MCG inhalation capsule Place 1 capsule (18 mcg total) into inhaler and inhale daily. (Patient taking differently: Place 18 mcg into inhaler and inhale 2 (two) times daily.) 1 capsule 0  . doxycycline (VIBRA-TABS) 100 MG tablet Take 1 tablet (100 mg total) by mouth 2 (two) times daily. 20 tablet 0   No facility-administered medications prior to visit.    Allergies  Allergen Reactions  . No Known Allergies     Review of Systems     Objective:    Physical Exam Constitutional:      Appearance: He is well-developed and well-nourished.  HENT:     Head: Normocephalic and atraumatic.     Right Ear: External ear normal.     Left Ear: External ear normal.     Nose: Nose normal.     Mouth/Throat:     Mouth: Oropharynx is clear and moist.      Comments: TMs and canals are clear. Eyes:     Extraocular Movements: EOM normal.     Conjunctiva/sclera: Conjunctivae normal.     Pupils: Pupils are equal, round, and reactive to light.  Neck:     Thyroid: No thyromegaly.  Cardiovascular:     Rate and Rhythm: Normal rate.     Heart sounds: Normal heart sounds.  Pulmonary:     Effort: Pulmonary effort is normal.     Breath sounds: Normal breath sounds.  Musculoskeletal:     Cervical back: Neck supple.  Lymphadenopathy:     Cervical: No cervical adenopathy.  Skin:    General: Skin is warm and dry.  Neurological:     Mental Status: He is alert and oriented to person, place, and time.  Psychiatric:        Mood and Affect: Mood and affect normal.     BP (!) 154/66   Pulse 63   Ht 5\' 9"  (1.753 m)   Wt 165 lb (74.8 kg)    SpO2 100%   BMI 24.37 kg/m  Wt Readings from Last 3 Encounters:  07/13/20 165 lb (74.8 kg)  05/22/20 166 lb (75.3 kg)  05/14/20 162 lb (73.5 kg)    Health Maintenance Due  Topic Date Due  . COVID-19 Vaccine (3 - Booster) 12/30/2019    There are no preventive care reminders to display for this patient.   Lab Results  Component Value Date   TSH 1.38 03/13/2020   Lab Results  Component Value Date   WBC 4.3 03/13/2020   HGB 13.4 03/13/2020   HCT 41.9 03/13/2020   MCV 94.2 03/13/2020   PLT 188 03/13/2020   Lab Results  Component Value Date   NA 140 03/13/2020   K 5.0 03/13/2020   CO2 31 03/13/2020   GLUCOSE 96 03/13/2020   BUN 17 03/13/2020   CREATININE 1.04 03/13/2020   BILITOT 0.6 03/13/2020   ALKPHOS 66 12/28/2015   AST 18 03/13/2020   ALT 17 03/13/2020   PROT 6.3 03/13/2020   ALBUMIN 4.0 12/22/2014   CALCIUM 9.1 03/13/2020   ANIONGAP 8 04/21/2016   Lab Results  Component Value Date   CHOL 108 03/13/2020   Lab Results  Component Value Date   HDL 52 03/13/2020     Lab Results  Component Value Date   LDLCALC 42 03/13/2020   Lab Results  Component Value Date   TRIG 55 03/13/2020   Lab Results  Component Value Date   CHOLHDL 2.1 03/13/2020   Lab Results  Component Value Date   HGBA1C 5.6 09/05/2019       Assessment & Plan:   Problem List Items Addressed This Visit   None   Visit Diagnoses    COPD exacerbation ([place])    -  Primary   Relevant Medications   albuterol (PROVENTIL) (5 MG/ML) 0.5% nebulizer solution   predniSONE (DELTASONE) 20 MG tablet      COPD exacerbation-he has had a normal chest x-ray was able to view the results under the care everywhere tab.  Recent labs were normal including a BNP.  No sign of volume overload on exam I do not think that this is cardiac related I think this is typical for COPD exacerbation for him.  We will go ahead and treat with prednisone and doxycycline.  Call if not better in the next couple of days  encouraged him to more aggressively use his albuterol over the next couple days until he is feeling better and then can start to taper off.   Meds ordered this encounter  Medications  . doxycycline (VIBRA-TABS) 100 MG tablet    Sig: Take 1 tablet (100 mg total) by mouth 2 (two) times daily.    Dispense:  20 tablet    Refill:  0  . predniSONE (DELTASONE) 20 MG tablet    Sig: Take 2 tablets (40 mg total) by mouth daily with breakfast for 4 days, THEN 1 tablet (20 mg total) daily with breakfast for 4 days.    Dispense:  12 tablet    Refill:  0     Beatrice Lecher, MD

## 2020-07-30 DIAGNOSIS — I340 Nonrheumatic mitral (valve) insufficiency: Secondary | ICD-10-CM | POA: Diagnosis not present

## 2020-07-30 DIAGNOSIS — I517 Cardiomegaly: Secondary | ICD-10-CM | POA: Diagnosis not present

## 2020-08-05 DIAGNOSIS — R0600 Dyspnea, unspecified: Secondary | ICD-10-CM | POA: Diagnosis not present

## 2020-08-05 DIAGNOSIS — I469 Cardiac arrest, cause unspecified: Secondary | ICD-10-CM | POA: Diagnosis not present

## 2020-08-05 DIAGNOSIS — I2510 Atherosclerotic heart disease of native coronary artery without angina pectoris: Secondary | ICD-10-CM | POA: Diagnosis not present

## 2020-08-05 DIAGNOSIS — I491 Atrial premature depolarization: Secondary | ICD-10-CM | POA: Diagnosis not present

## 2020-08-05 DIAGNOSIS — I493 Ventricular premature depolarization: Secondary | ICD-10-CM | POA: Diagnosis not present

## 2020-08-18 DIAGNOSIS — I469 Cardiac arrest, cause unspecified: Secondary | ICD-10-CM | POA: Diagnosis not present

## 2020-08-18 DIAGNOSIS — I2510 Atherosclerotic heart disease of native coronary artery without angina pectoris: Secondary | ICD-10-CM | POA: Diagnosis not present

## 2020-08-24 DIAGNOSIS — Z95810 Presence of automatic (implantable) cardiac defibrillator: Secondary | ICD-10-CM | POA: Diagnosis not present

## 2020-08-24 DIAGNOSIS — Z4502 Encounter for adjustment and management of automatic implantable cardiac defibrillator: Secondary | ICD-10-CM | POA: Diagnosis not present

## 2020-08-24 DIAGNOSIS — I472 Ventricular tachycardia: Secondary | ICD-10-CM | POA: Diagnosis not present

## 2020-08-28 DIAGNOSIS — L820 Inflamed seborrheic keratosis: Secondary | ICD-10-CM | POA: Diagnosis not present

## 2020-08-28 DIAGNOSIS — L814 Other melanin hyperpigmentation: Secondary | ICD-10-CM | POA: Diagnosis not present

## 2020-08-28 DIAGNOSIS — L578 Other skin changes due to chronic exposure to nonionizing radiation: Secondary | ICD-10-CM | POA: Diagnosis not present

## 2020-08-28 DIAGNOSIS — L570 Actinic keratosis: Secondary | ICD-10-CM | POA: Diagnosis not present

## 2020-09-17 DIAGNOSIS — I119 Hypertensive heart disease without heart failure: Secondary | ICD-10-CM | POA: Diagnosis not present

## 2020-09-17 DIAGNOSIS — I2510 Atherosclerotic heart disease of native coronary artery without angina pectoris: Secondary | ICD-10-CM | POA: Diagnosis not present

## 2020-09-17 DIAGNOSIS — Z95810 Presence of automatic (implantable) cardiac defibrillator: Secondary | ICD-10-CM | POA: Diagnosis not present

## 2020-09-17 DIAGNOSIS — Z87891 Personal history of nicotine dependence: Secondary | ICD-10-CM | POA: Diagnosis not present

## 2020-09-17 DIAGNOSIS — Z8674 Personal history of sudden cardiac arrest: Secondary | ICD-10-CM | POA: Diagnosis not present

## 2020-09-17 DIAGNOSIS — R0602 Shortness of breath: Secondary | ICD-10-CM | POA: Diagnosis not present

## 2020-09-17 DIAGNOSIS — I469 Cardiac arrest, cause unspecified: Secondary | ICD-10-CM | POA: Diagnosis not present

## 2020-10-01 ENCOUNTER — Ambulatory Visit (INDEPENDENT_AMBULATORY_CARE_PROVIDER_SITE_OTHER): Payer: Medicare Other | Admitting: Family Medicine

## 2020-10-01 ENCOUNTER — Encounter: Payer: Self-pay | Admitting: Family Medicine

## 2020-10-01 ENCOUNTER — Ambulatory Visit: Payer: Medicare Other | Admitting: Family Medicine

## 2020-10-01 ENCOUNTER — Other Ambulatory Visit: Payer: Self-pay

## 2020-10-01 VITALS — BP 137/57 | HR 52 | Ht 69.0 in | Wt 166.0 lb

## 2020-10-01 DIAGNOSIS — J432 Centrilobular emphysema: Secondary | ICD-10-CM

## 2020-10-01 DIAGNOSIS — I2510 Atherosclerotic heart disease of native coronary artery without angina pectoris: Secondary | ICD-10-CM | POA: Diagnosis not present

## 2020-10-01 DIAGNOSIS — J441 Chronic obstructive pulmonary disease with (acute) exacerbation: Secondary | ICD-10-CM

## 2020-10-01 DIAGNOSIS — M25473 Effusion, unspecified ankle: Secondary | ICD-10-CM

## 2020-10-01 DIAGNOSIS — I252 Old myocardial infarction: Secondary | ICD-10-CM

## 2020-10-01 MED ORDER — PREDNISONE 20 MG PO TABS: ORAL_TABLET | ORAL | 0 refills | Status: DC

## 2020-10-01 MED ORDER — AMOXICILLIN-POT CLAVULANATE 875-125 MG PO TABS
1.0000 | ORAL_TABLET | Freq: Two times a day (BID) | ORAL | 0 refills | Status: DC
Start: 2020-10-01 — End: 2021-01-29

## 2020-10-01 NOTE — Assessment & Plan Note (Signed)
Recent stress echo 09/2020 showed EF 72%, fixed scar

## 2020-10-01 NOTE — Progress Notes (Addendum)
Acute Office Visit  Subjective:    Patient ID: Zachary Waters, male    DOB: 1940-12-25, 80 y.o.   MRN: [id number]  Chief Complaint  Patient presents with  . Cough  . URI    HPI Patient is in today for 10 days of increased SOB, runny nose and nasl congestion and cough.  Cough got worse and became productive about 4 days ago.  Cough is worse at night.  Used Nyquil last night.  Sputum is yellow to dark. No fever or chills  Past Medical History:  Diagnosis Date  . Cervical spondylosis with radiculopathy 03/08/2012  . Cervical spondylosis with radiculopathy   . COPD (chronic obstructive pulmonary disease) ([place])   . Dyspnea   . Essential hypertension   . GERD (gastroesophageal reflux disease) once in a great while  . Hyperlipidemia 02/02/2018  . Pneumonia 10/2009  . Thyroid disease     Past Surgical History:  Procedure Laterality Date  . COLONOSCOPY    . HERNIA REPAIR     inguinal hernia repair on right and left  . INGUINAL HERNIA REPAIR Bilateral 04/21/2016   Procedure: LAPAROSCOPIC LEFT INGUINAL HERNIA REPAIR AND RIGHT FEMORAL HERNIA REPAIR;  Surgeon: Michael Boston, MD;  Location: [facility];  Service: General;  Laterality: Bilateral;  . INSERTION OF MESH Bilateral 04/21/2016   Procedure: INSERTION OF MESH LEFT INGUINAL HERNIA  AND INSERTION OF MESH RIGHT FEMORAL HERNIA;  Surgeon: Michael Boston, MD;  Location: [facility];  Service: General;  Laterality: Bilateral;  . LEG SURGERY Right    tumor removed as a child from femur?  . ROTATOR CUFF REPAIR  2005   Left  . TONSILLECTOMY      Family History  Problem Relation Age of Onset  . Prostate cancer Father 13  . Heart disease Mother 59       deceased age 58  . Depression Brother        Commited suicide  . Depression Brother     Social History   Socioeconomic History  . Marital status: Married    Spouse name: [name]  . Number of children: 2  . Years of education: 40  . Highest education level: Associate degree: academic  program  Occupational History  . Occupation: retired    Comment: Producer, television/film/video  Tobacco Use  . Smoking status: Former Smoker    Types: Pipe    Quit date: 10/07/2009    Years since quitting: 10.9  . Smokeless tobacco: Never Used  Vaping Use  . Vaping Use: Never used  Substance and Sexual Activity  . Alcohol use: Yes    Alcohol/week: 1.0 standard drink    Types: 1 Glasses of wine per week    Comment: occasional  . Drug use: No  . Sexual activity: Never  Other Topics Concern  . Not on file  Social History Narrative   Power walks 3 days per week for 2 miles. Golfs twice a week.    Social Determinants of Health   Financial Resource Strain: Not on file  Food Insecurity: Not on file  Transportation Needs: Not on file  Physical Activity: Not on file  Stress: Not on file  Social Connections: Not on file  Intimate Partner Violence: Not on file    Outpatient Medications Prior to Visit  Medication Sig Dispense Refill  . albuterol (PROVENTIL) (5 MG/ML) 0.5% nebulizer solution Inhale into the lungs.    Marland Kitchen albuterol (VENTOLIN HFA) 108 (90 Base) MCG/ACT inhaler Inhale 2 puffs into the  lungs every 6 (six) hours as needed.    . Ascorbic Acid (VITAMIN C) 1000 MG tablet Take 2,000 mg by mouth daily.    Marland Kitchen aspirin EC 81 MG tablet Take 81 mg by mouth daily.    Marland Kitchen atorvastatin (LIPITOR) 80 MG tablet Take 80 mg by mouth daily.  3  . budesonide-formoterol (SYMBICORT) 160-4.5 MCG/ACT inhaler Inhale 2 puffs into the lungs 2 (two) times daily.    . carvedilol (COREG) 6.25 MG tablet Take 6.25 mg by mouth 2 (two) times daily.    . clopidogrel (PLAVIX) 75 MG tablet Take 75 mg by mouth daily.  9  . levothyroxine (SYNTHROID) 112 MCG tablet TAKE 1 TABLET BY MOUTH EVERY DAY BEFORE BREAKFAST 90 tablet 1  . lisinopril (ZESTRIL) 10 MG tablet Take 10 mg by mouth daily.  3  . Omega-3 Fatty Acids (FISH OIL) 1000 MG CAPS Take 1 capsule by mouth daily.    Marland Kitchen QUERCETIN PO Take 2 tablets by mouth daily. Pt reports  taking 1 tablet daily    . tiotropium (SPIRIVA HANDIHALER) 18 MCG inhalation capsule Place 1 capsule (18 mcg total) into inhaler and inhale daily. (Patient taking differently: Place 18 mcg into inhaler and inhale 2 (two) times daily.) 1 capsule 0  . doxycycline (VIBRA-TABS) 100 MG tablet Take 1 tablet (100 mg total) by mouth 2 (two) times daily. 20 tablet 0   No facility-administered medications prior to visit.    Allergies  Allergen Reactions  . No Known Allergies     Review of Systems     Objective:    Physical Exam Constitutional:      Appearance: He is well-developed.  HENT:     Head: Normocephalic and atraumatic.     Right Ear: External ear normal.     Left Ear: External ear normal.     Nose: Nose normal.  Eyes:     Conjunctiva/sclera: Conjunctivae normal.     Pupils: Pupils are equal, round, and reactive to light.  Neck:     Thyroid: No thyromegaly.  Cardiovascular:     Rate and Rhythm: Normal rate.     Heart sounds: Normal heart sounds.  Pulmonary:     Effort: Pulmonary effort is normal.     Comments: Diffuse dec BS bilat.  No wheeze or ronchi.   Musculoskeletal:     Cervical back: Neck supple.  Lymphadenopathy:     Cervical: No cervical adenopathy.  Skin:    General: Skin is warm and dry.     Comments: Trace pitting edema of both lower legs bilaterally.   Neurological:     Mental Status: He is alert and oriented to person, place, and time.     BP (!) 137/57   Pulse (!) 52   Ht 5\' 9"  (1.753 m)   Wt 166 lb (75.3 kg)   SpO2 98%   BMI 24.51 kg/m  Wt Readings from Last 3 Encounters:  10/01/20 166 lb (75.3 kg)  07/13/20 165 lb (74.8 kg)  05/22/20 166 lb (75.3 kg)    Health Maintenance Due  Topic Date Due  . Zoster Vaccines- Shingrix (1 of 2) Never done  . COVID-19 Vaccine (3 - Booster) 11/29/2019    There are no preventive care reminders to display for this patient.   Lab Results  Component Value Date   TSH 1.38 03/13/2020   Lab Results   Component Value Date   WBC 4.3 03/13/2020   HGB 13.4 03/13/2020   HCT 41.9 03/13/2020  MCV 94.2 03/13/2020   PLT 188 03/13/2020   Lab Results  Component Value Date   NA 140 03/13/2020   K 5.0 03/13/2020   CO2 31 03/13/2020   GLUCOSE 96 03/13/2020   BUN 17 03/13/2020   CREATININE 1.04 03/13/2020   BILITOT 0.6 03/13/2020   ALKPHOS 66 12/28/2015   AST 18 03/13/2020   ALT 17 03/13/2020   PROT 6.3 03/13/2020   ALBUMIN 4.0 12/22/2014   CALCIUM 9.1 03/13/2020   ANIONGAP 8 04/21/2016   Lab Results  Component Value Date   CHOL 108 03/13/2020   Lab Results  Component Value Date   HDL 52 03/13/2020   Lab Results  Component Value Date   LDLCALC 42 03/13/2020   Lab Results  Component Value Date   TRIG 55 03/13/2020   Lab Results  Component Value Date   CHOLHDL 2.1 03/13/2020   Lab Results  Component Value Date   HGBA1C 5.6 09/05/2019       Assessment & Plan:   Problem List Items Addressed This Visit      Cardiovascular and Mediastinum   Coronary artery calcification seen on CAT scan    Recent stress echo 09/2020 showed EF 72%, fixed scar        Respiratory   Centrilobular emphysema ([facility])    Rarely wearing his oxygen. Asked him to check his oxygen when active to make sure he is not dropping his sats.  Still golfing twice a week.  Try to avoid poor air quality days this summer.        Relevant Medications   predniSONE (DELTASONE) 20 MG tablet     Other   History of MI (myocardial infarction)    Other Visit Diagnoses    COPD exacerbation ([place])    -  Primary   Relevant Medications   amoxicillin-clavulanate (AUGMENTIN) 875-125 MG tablet   predniSONE (DELTASONE) 20 MG tablet   Ankle edema         Tx with Augment and longer pred taper.  Monitor trace ankle edema, to make sure not getting worse.  BP at goal.  F/U if not better after the weekend.  recently had doxy.  Ankle edema - likely venous stasis.  Monitor carefully for changes. Consider additional  w/u if not improving to check CMP, TSH, BNP, etc.    Meds ordered this encounter  Medications  . amoxicillin-clavulanate (AUGMENTIN) 875-125 MG tablet    Sig: Take 1 tablet by mouth 2 (two) times daily.    Dispense:  14 tablet    Refill:  0  . predniSONE (DELTASONE) 20 MG tablet    Sig: Take 2 tablets (40 mg total) by mouth daily with breakfast for 3 days, THEN 1 tablet (20 mg total) daily with breakfast for 3 days, THEN 0.5 tablets (10 mg total) daily with breakfast for 3 days.    Dispense:  11 tablet    Refill:  0     Beatrice Lecher, MD

## 2020-10-01 NOTE — Assessment & Plan Note (Signed)
Rarely wearing his oxygen. Asked him to check his oxygen when active to make sure he is not dropping his sats.  Still golfing twice a week.  Try to avoid poor air quality days this summer.

## 2020-10-20 DIAGNOSIS — R0602 Shortness of breath: Secondary | ICD-10-CM | POA: Diagnosis not present

## 2020-10-20 DIAGNOSIS — Z87891 Personal history of nicotine dependence: Secondary | ICD-10-CM | POA: Diagnosis not present

## 2020-10-20 DIAGNOSIS — J449 Chronic obstructive pulmonary disease, unspecified: Secondary | ICD-10-CM | POA: Diagnosis not present

## 2020-10-20 DIAGNOSIS — R918 Other nonspecific abnormal finding of lung field: Secondary | ICD-10-CM | POA: Diagnosis not present

## 2020-11-23 DIAGNOSIS — I472 Ventricular tachycardia: Secondary | ICD-10-CM | POA: Diagnosis not present

## 2020-11-24 ENCOUNTER — Encounter: Payer: Self-pay | Admitting: Family Medicine

## 2020-11-24 DIAGNOSIS — J449 Chronic obstructive pulmonary disease, unspecified: Secondary | ICD-10-CM | POA: Diagnosis not present

## 2020-11-24 DIAGNOSIS — E871 Hypo-osmolality and hyponatremia: Secondary | ICD-10-CM | POA: Diagnosis not present

## 2020-11-24 DIAGNOSIS — Z955 Presence of coronary angioplasty implant and graft: Secondary | ICD-10-CM | POA: Diagnosis not present

## 2020-11-24 DIAGNOSIS — J9601 Acute respiratory failure with hypoxia: Secondary | ICD-10-CM | POA: Diagnosis not present

## 2020-11-24 DIAGNOSIS — I340 Nonrheumatic mitral (valve) insufficiency: Secondary | ICD-10-CM | POA: Diagnosis not present

## 2020-11-24 DIAGNOSIS — J9621 Acute and chronic respiratory failure with hypoxia: Secondary | ICD-10-CM | POA: Diagnosis not present

## 2020-11-24 DIAGNOSIS — E039 Hypothyroidism, unspecified: Secondary | ICD-10-CM | POA: Diagnosis not present

## 2020-11-24 DIAGNOSIS — R918 Other nonspecific abnormal finding of lung field: Secondary | ICD-10-CM | POA: Diagnosis not present

## 2020-11-24 DIAGNOSIS — H903 Sensorineural hearing loss, bilateral: Secondary | ICD-10-CM | POA: Diagnosis not present

## 2020-11-24 DIAGNOSIS — I5189 Other ill-defined heart diseases: Secondary | ICD-10-CM | POA: Diagnosis not present

## 2020-11-24 DIAGNOSIS — Z7902 Long term (current) use of antithrombotics/antiplatelets: Secondary | ICD-10-CM | POA: Diagnosis not present

## 2020-11-24 DIAGNOSIS — Z86010 Personal history of colonic polyps: Secondary | ICD-10-CM | POA: Diagnosis not present

## 2020-11-24 DIAGNOSIS — Z7951 Long term (current) use of inhaled steroids: Secondary | ICD-10-CM | POA: Diagnosis not present

## 2020-11-24 DIAGNOSIS — E875 Hyperkalemia: Secondary | ICD-10-CM | POA: Diagnosis not present

## 2020-11-24 DIAGNOSIS — Z87891 Personal history of nicotine dependence: Secondary | ICD-10-CM | POA: Diagnosis not present

## 2020-11-24 DIAGNOSIS — I517 Cardiomegaly: Secondary | ICD-10-CM | POA: Diagnosis not present

## 2020-11-24 DIAGNOSIS — R079 Chest pain, unspecified: Secondary | ICD-10-CM | POA: Diagnosis not present

## 2020-11-24 DIAGNOSIS — R0689 Other abnormalities of breathing: Secondary | ICD-10-CM | POA: Diagnosis not present

## 2020-11-24 DIAGNOSIS — I214 Non-ST elevation (NSTEMI) myocardial infarction: Secondary | ICD-10-CM | POA: Diagnosis not present

## 2020-11-24 DIAGNOSIS — I252 Old myocardial infarction: Secondary | ICD-10-CM | POA: Diagnosis not present

## 2020-11-24 DIAGNOSIS — J9690 Respiratory failure, unspecified, unspecified whether with hypoxia or hypercapnia: Secondary | ICD-10-CM | POA: Diagnosis not present

## 2020-11-24 DIAGNOSIS — R778 Other specified abnormalities of plasma proteins: Secondary | ICD-10-CM | POA: Diagnosis not present

## 2020-11-24 DIAGNOSIS — R062 Wheezing: Secondary | ICD-10-CM | POA: Diagnosis not present

## 2020-11-24 DIAGNOSIS — N179 Acute kidney failure, unspecified: Secondary | ICD-10-CM | POA: Diagnosis not present

## 2020-11-24 DIAGNOSIS — J440 Chronic obstructive pulmonary disease with acute lower respiratory infection: Secondary | ICD-10-CM | POA: Diagnosis not present

## 2020-11-24 DIAGNOSIS — R739 Hyperglycemia, unspecified: Secondary | ICD-10-CM | POA: Diagnosis not present

## 2020-11-24 DIAGNOSIS — R0789 Other chest pain: Secondary | ICD-10-CM | POA: Diagnosis not present

## 2020-11-24 DIAGNOSIS — J80 Acute respiratory distress syndrome: Secondary | ICD-10-CM | POA: Diagnosis not present

## 2020-11-24 DIAGNOSIS — I2510 Atherosclerotic heart disease of native coronary artery without angina pectoris: Secondary | ICD-10-CM | POA: Diagnosis not present

## 2020-11-24 DIAGNOSIS — I10 Essential (primary) hypertension: Secondary | ICD-10-CM | POA: Diagnosis not present

## 2020-11-24 DIAGNOSIS — Z7982 Long term (current) use of aspirin: Secondary | ICD-10-CM | POA: Diagnosis not present

## 2020-11-24 DIAGNOSIS — Z95810 Presence of automatic (implantable) cardiac defibrillator: Secondary | ICD-10-CM | POA: Diagnosis not present

## 2020-11-24 DIAGNOSIS — J441 Chronic obstructive pulmonary disease with (acute) exacerbation: Secondary | ICD-10-CM | POA: Diagnosis not present

## 2020-11-24 DIAGNOSIS — R0600 Dyspnea, unspecified: Secondary | ICD-10-CM | POA: Diagnosis not present

## 2020-11-24 DIAGNOSIS — Z950 Presence of cardiac pacemaker: Secondary | ICD-10-CM | POA: Diagnosis not present

## 2020-11-24 DIAGNOSIS — Z79899 Other long term (current) drug therapy: Secondary | ICD-10-CM | POA: Diagnosis not present

## 2020-11-24 DIAGNOSIS — R0602 Shortness of breath: Secondary | ICD-10-CM | POA: Diagnosis not present

## 2020-11-25 ENCOUNTER — Telehealth: Payer: Self-pay | Admitting: Family Medicine

## 2020-11-25 ENCOUNTER — Encounter: Payer: Self-pay | Admitting: Family Medicine

## 2020-11-25 DIAGNOSIS — I5189 Other ill-defined heart diseases: Secondary | ICD-10-CM | POA: Diagnosis not present

## 2020-11-25 DIAGNOSIS — I340 Nonrheumatic mitral (valve) insufficiency: Secondary | ICD-10-CM | POA: Diagnosis not present

## 2020-11-25 DIAGNOSIS — Z95810 Presence of automatic (implantable) cardiac defibrillator: Secondary | ICD-10-CM | POA: Diagnosis not present

## 2020-11-25 DIAGNOSIS — I10 Essential (primary) hypertension: Secondary | ICD-10-CM | POA: Diagnosis not present

## 2020-11-25 DIAGNOSIS — R778 Other specified abnormalities of plasma proteins: Secondary | ICD-10-CM | POA: Diagnosis not present

## 2020-11-25 DIAGNOSIS — I517 Cardiomegaly: Secondary | ICD-10-CM | POA: Diagnosis not present

## 2020-11-25 DIAGNOSIS — J440 Chronic obstructive pulmonary disease with acute lower respiratory infection: Secondary | ICD-10-CM | POA: Diagnosis not present

## 2020-11-25 DIAGNOSIS — I214 Non-ST elevation (NSTEMI) myocardial infarction: Secondary | ICD-10-CM | POA: Diagnosis not present

## 2020-11-25 DIAGNOSIS — I2510 Atherosclerotic heart disease of native coronary artery without angina pectoris: Secondary | ICD-10-CM | POA: Diagnosis not present

## 2020-11-25 DIAGNOSIS — J9601 Acute respiratory failure with hypoxia: Secondary | ICD-10-CM | POA: Diagnosis not present

## 2020-11-25 NOTE — Telephone Encounter (Signed)
Patient sent in a message through mychart and had trouble breathing so he called 911. Patient is at [facility] in [place] and they are running a lot of test on him. States that he is being transferred [place]. Wanted to keep PCP aware of everything going.

## 2020-11-25 NOTE — Telephone Encounter (Signed)
Left message on phone for Zachary Waters his wife.

## 2020-11-26 ENCOUNTER — Encounter: Payer: Self-pay | Admitting: Family Medicine

## 2020-11-26 DIAGNOSIS — J9601 Acute respiratory failure with hypoxia: Secondary | ICD-10-CM | POA: Diagnosis not present

## 2020-11-26 DIAGNOSIS — J440 Chronic obstructive pulmonary disease with acute lower respiratory infection: Secondary | ICD-10-CM | POA: Diagnosis not present

## 2020-11-26 DIAGNOSIS — Z95810 Presence of automatic (implantable) cardiac defibrillator: Secondary | ICD-10-CM | POA: Diagnosis not present

## 2020-11-26 DIAGNOSIS — I10 Essential (primary) hypertension: Secondary | ICD-10-CM | POA: Diagnosis not present

## 2020-11-26 DIAGNOSIS — R778 Other specified abnormalities of plasma proteins: Secondary | ICD-10-CM | POA: Diagnosis not present

## 2020-11-26 DIAGNOSIS — I2510 Atherosclerotic heart disease of native coronary artery without angina pectoris: Secondary | ICD-10-CM | POA: Diagnosis not present

## 2020-11-27 DIAGNOSIS — Z95810 Presence of automatic (implantable) cardiac defibrillator: Secondary | ICD-10-CM | POA: Diagnosis not present

## 2020-11-27 DIAGNOSIS — I214 Non-ST elevation (NSTEMI) myocardial infarction: Secondary | ICD-10-CM | POA: Diagnosis not present

## 2020-11-27 DIAGNOSIS — I10 Essential (primary) hypertension: Secondary | ICD-10-CM | POA: Diagnosis not present

## 2020-11-27 DIAGNOSIS — R778 Other specified abnormalities of plasma proteins: Secondary | ICD-10-CM | POA: Diagnosis not present

## 2020-11-27 DIAGNOSIS — I2510 Atherosclerotic heart disease of native coronary artery without angina pectoris: Secondary | ICD-10-CM | POA: Diagnosis not present

## 2020-11-27 DIAGNOSIS — J9601 Acute respiratory failure with hypoxia: Secondary | ICD-10-CM | POA: Diagnosis not present

## 2020-11-27 DIAGNOSIS — J440 Chronic obstructive pulmonary disease with acute lower respiratory infection: Secondary | ICD-10-CM | POA: Diagnosis not present

## 2020-11-28 DIAGNOSIS — Z95810 Presence of automatic (implantable) cardiac defibrillator: Secondary | ICD-10-CM | POA: Diagnosis not present

## 2020-11-28 DIAGNOSIS — J9601 Acute respiratory failure with hypoxia: Secondary | ICD-10-CM | POA: Diagnosis not present

## 2020-11-28 DIAGNOSIS — J440 Chronic obstructive pulmonary disease with acute lower respiratory infection: Secondary | ICD-10-CM | POA: Diagnosis not present

## 2020-11-28 DIAGNOSIS — I10 Essential (primary) hypertension: Secondary | ICD-10-CM | POA: Diagnosis not present

## 2020-11-29 DIAGNOSIS — I2510 Atherosclerotic heart disease of native coronary artery without angina pectoris: Secondary | ICD-10-CM | POA: Diagnosis not present

## 2020-11-29 DIAGNOSIS — J440 Chronic obstructive pulmonary disease with acute lower respiratory infection: Secondary | ICD-10-CM | POA: Diagnosis not present

## 2020-11-29 DIAGNOSIS — J9601 Acute respiratory failure with hypoxia: Secondary | ICD-10-CM | POA: Diagnosis not present

## 2020-11-30 DIAGNOSIS — N179 Acute kidney failure, unspecified: Secondary | ICD-10-CM | POA: Diagnosis not present

## 2020-11-30 DIAGNOSIS — J441 Chronic obstructive pulmonary disease with (acute) exacerbation: Secondary | ICD-10-CM | POA: Diagnosis not present

## 2020-11-30 DIAGNOSIS — R0600 Dyspnea, unspecified: Secondary | ICD-10-CM | POA: Diagnosis not present

## 2020-11-30 DIAGNOSIS — Z95810 Presence of automatic (implantable) cardiac defibrillator: Secondary | ICD-10-CM | POA: Diagnosis not present

## 2020-11-30 DIAGNOSIS — I2510 Atherosclerotic heart disease of native coronary artery without angina pectoris: Secondary | ICD-10-CM | POA: Diagnosis not present

## 2020-11-30 DIAGNOSIS — I214 Non-ST elevation (NSTEMI) myocardial infarction: Secondary | ICD-10-CM | POA: Diagnosis not present

## 2020-11-30 DIAGNOSIS — Z79899 Other long term (current) drug therapy: Secondary | ICD-10-CM | POA: Diagnosis not present

## 2020-11-30 DIAGNOSIS — J9690 Respiratory failure, unspecified, unspecified whether with hypoxia or hypercapnia: Secondary | ICD-10-CM | POA: Diagnosis not present

## 2020-11-30 DIAGNOSIS — J9601 Acute respiratory failure with hypoxia: Secondary | ICD-10-CM | POA: Diagnosis not present

## 2020-11-30 DIAGNOSIS — J440 Chronic obstructive pulmonary disease with acute lower respiratory infection: Secondary | ICD-10-CM | POA: Diagnosis not present

## 2020-11-30 DIAGNOSIS — I10 Essential (primary) hypertension: Secondary | ICD-10-CM | POA: Diagnosis not present

## 2020-11-30 DIAGNOSIS — Z7982 Long term (current) use of aspirin: Secondary | ICD-10-CM | POA: Diagnosis not present

## 2020-11-30 DIAGNOSIS — Z87891 Personal history of nicotine dependence: Secondary | ICD-10-CM | POA: Diagnosis not present

## 2020-11-30 DIAGNOSIS — E875 Hyperkalemia: Secondary | ICD-10-CM | POA: Diagnosis not present

## 2020-11-30 DIAGNOSIS — Z955 Presence of coronary angioplasty implant and graft: Secondary | ICD-10-CM | POA: Diagnosis not present

## 2020-11-30 HISTORY — PX: CARDIAC CATHETERIZATION: SHX172

## 2020-12-08 DIAGNOSIS — J449 Chronic obstructive pulmonary disease, unspecified: Secondary | ICD-10-CM | POA: Diagnosis not present

## 2020-12-08 DIAGNOSIS — R918 Other nonspecific abnormal finding of lung field: Secondary | ICD-10-CM | POA: Diagnosis not present

## 2020-12-11 ENCOUNTER — Encounter: Payer: Self-pay | Admitting: Family Medicine

## 2020-12-11 ENCOUNTER — Ambulatory Visit (INDEPENDENT_AMBULATORY_CARE_PROVIDER_SITE_OTHER): Payer: Medicare Other | Admitting: Family Medicine

## 2020-12-11 DIAGNOSIS — Z0000 Encounter for general adult medical examination without abnormal findings: Secondary | ICD-10-CM | POA: Diagnosis not present

## 2020-12-11 NOTE — Patient Instructions (Addendum)
[facility] Maintenance Summary and Written Plan of Care  [name] ,  Thank you for allowing me to perform your Medicare Annual Wellness Visit and for your ongoing commitment to your health.   Health Maintenance & Immunization History Health Maintenance  Topic Date Due  . Hepatitis C Screening  01/19/2021 (Originally 04/10/1959)  . INFLUENZA VACCINE  01/27/2021 (Originally 12/07/2020)  . Zoster Vaccines- Shingrix (2 of 2) 03/13/2021 (Originally 06/08/2017)  . TETANUS/TDAP  01/02/2022  . COLONOSCOPY (Pts 45-28yr Insurance coverage will need to be confirmed)  07/17/2022  . COVID-19 Vaccine  Completed  . PNA vac Low Risk Adult  Completed  . HPV VACCINES  Aged Out   Immunization History  Administered Date(s) Administered  . Fluad Quad(high Dose 65+) 02/12/2020  . Influenza Split 02/06/2013  . Influenza, High Dose Seasonal PF 01/24/2012, 02/04/2016, 02/02/2017, 02/06/2018, 02/05/2019  . Influenza,inj,Quad PF,6+ Mos 02/12/2013, 01/09/2014, 01/13/2015, 02/07/2016  . Moderna Sars-Covid-2 Vaccination 05/30/2019, 06/11/2019, 07/02/2019, 03/26/2020, 08/24/2020  . Pneumococcal Conjugate-13 08/06/2013, 12/20/2013, 05/15/2019  . Pneumococcal Polysaccharide-23 05/09/2010  . Pneumococcal-Unspecified 06/09/2010  . Tdap 01/03/2012  . Zoster Recombinat (Shingrix) 04/13/2017  . Zoster, Live 10/27/2010, 02/09/2017    These are the patient goals that we discussed:  Goals Addressed               This Visit's Progress   .  Patient Stated (pt-stated)        12/11/2020 AWV Goal: Exercise for General Health  Patient will verbalize understanding of the benefits of increased physical activity: Exercising regularly is important. It will improve your overall fitness, flexibility, and endurance. Regular exercise also will improve your overall health. It can help you control your weight, reduce stress, and improve your bone density. Over the next year, patient will increase  physical activity as tolerated with a goal of at least 150 minutes of moderate physical activity per week.  You can tell that you are exercising at a moderate intensity if your heart starts beating faster and you start breathing faster but can still hold a conversation. Moderate-intensity exercise ideas include: Walking 1 mile (1.6 km) in about 15 minutes Biking Hiking Golfing Dancing Water aerobics Patient will verbalize understanding of everyday activities that increase physical activity by providing examples like the following: Yard work, such as: PSales promotion account executiveGardening Washing windows or floors Patient will be able to explain general safety guidelines for exercising:  Before you start a new exercise program, talk with your health care provider. Do not exercise so much that you hurt yourself, feel dizzy, or get very short of breath. Wear comfortable clothes and wear shoes with good support. Drink plenty of water while you exercise to prevent dehydration or heat stroke. Work out until your breathing and your heartbeat get faster.          This is a list of Health Maintenance Items that are overdue or due now: Shingles vaccine-2nd dose  Orders/Referrals Placed Today: No orders of the defined types were placed in this encounter.  (Contact our referral department at [phone] you have not spoken with someone about your referral appointment within the next 5 days)    Follow-up Plan Follow-up with MHali Marry MD as planned Please let uKoreaknow the date for your shingles vaccine 2nd dose. Medicare wellness visit in one year.  AVS printed and mailed to the patient.      Health  Maintenance, Male Adopting a healthy lifestyle and getting preventive care are important in promoting health and wellness. Ask your health care provider about: The right schedule for you to have  regular tests and exams. Things you can do on your own to prevent diseases and keep yourself healthy. What should I know about diet, weight, and exercise? Eat a healthy diet  Eat a diet that includes plenty of vegetables, fruits, low-fat dairy products, and lean protein. Do not eat a lot of foods that are high in solid fats, added sugars, or sodium.  Maintain a healthy weight Body mass index (BMI) is a measurement that can be used to identify possible weight problems. It estimates body fat based on height and weight. Your health care provider can help determine your BMI and help you achieve or maintain ahealthy weight. Get regular exercise Get regular exercise. This is one of the most important things you can do for your health. Most adults should: Exercise for at least 150 minutes each week. The exercise should increase your heart rate and make you sweat (moderate-intensity exercise). Do strengthening exercises at least twice a week. This is in addition to the moderate-intensity exercise. Spend less time sitting. Even light physical activity can be beneficial. Watch cholesterol and blood lipids Have your blood tested for lipids and cholesterol at 78 years of age, then havethis test every 5 years. You may need to have your cholesterol levels checked more often if: Your lipid or cholesterol levels are high. You are older than 78 years of age. You are at high risk for heart disease. What should I know about cancer screening? Many types of cancers can be detected early and may often be prevented. Depending on your health history and family history, you may need to have cancer screening at various ages. This may include screening for: Colorectal cancer. Prostate cancer. Skin cancer. Lung cancer. What should I know about heart disease, diabetes, and high blood pressure? Blood pressure and heart disease High blood pressure causes heart disease and increases the risk of stroke. This is more  likely to develop in people who have high blood pressure readings, are of African descent, or are overweight. Talk with your health care provider about your target blood pressure readings. Have your blood pressure checked: Every 3-5 years if you are 40-80 years of age. Every year if you are 59 years old or older. If you are between the ages of 59 and 25 and are a current or former smoker, ask your health care provider if you should have a one-time screening for abdominal aortic aneurysm (AAA). Diabetes Have regular diabetes screenings. This checks your fasting blood sugar level. Have the screening done: Once every three years after age 56 if you are at a normal weight and have a low risk for diabetes. More often and at a younger age if you are overweight or have a high risk for diabetes. What should I know about preventing infection? Hepatitis B If you have a higher risk for hepatitis B, you should be screened for this virus. Talk with your health care provider to find out if you are at risk forhepatitis B infection. Hepatitis C Blood testing is recommended for: Everyone born from [birth year] through 1963. Anyone with known risk factors for hepatitis C. Sexually transmitted infections (STIs) You should be screened each year for STIs, including gonorrhea and chlamydia, if: You are sexually active and are younger than 78 years of age. You are older than 78 years of age  and your health care provider tells you that you are at risk for this type of infection. Your sexual activity has changed since you were last screened, and you are at increased risk for chlamydia or gonorrhea. Ask your health care provider if you are at risk. Ask your health care provider about whether you are at high risk for HIV. Your health care provider may recommend a prescription medicine to help prevent HIV infection. If you choose to take medicine to prevent HIV, you should first get tested for HIV. You should then be tested every  3 months for as long as you are taking the medicine. Follow these instructions at home: Lifestyle Do not use any products that contain nicotine or tobacco, such as cigarettes, e-cigarettes, and chewing tobacco. If you need help quitting, ask your health care provider. Do not use street drugs. Do not share needles. Ask your health care provider for help if you need support or information about quitting drugs. Alcohol use Do not drink alcohol if your health care provider tells you not to drink. If you drink alcohol: Limit how much you have to 0-2 drinks a day. Be aware of how much alcohol is in your drink. In the [place] one drink equals one 12 oz bottle of beer (355 mL), one 5 oz glass of wine (148 mL), or one 1 oz glass of hard liquor (44 mL). General instructions Schedule regular health, dental, and eye exams. Stay current with your vaccines. Tell your health care provider if: You often feel depressed. You have ever been abused or do not feel safe at home. Summary Adopting a healthy lifestyle and getting preventive care are important in promoting health and wellness. Follow your health care provider's instructions about healthy diet, exercising, and getting tested or screened for diseases. Follow your health care provider's instructions on monitoring your cholesterol and blood pressure. This information is not intended to replace advice given to you by your health care provider. Make sure you discuss any questions you have with your healthcare provider. Document Revised: 04/18/2018 Document Reviewed: 04/18/2018 Elsevier Patient Education  2022 [employer].

## 2020-12-11 NOTE — Progress Notes (Signed)
MEDICARE ANNUAL WELLNESS VISIT  12/11/2020  Telephone Visit Disclaimer This Medicare AWV was conducted by telephone due to national recommendations for restrictions regarding the COVID-19 Pandemic (e.g. social distancing).  I verified, using two identifiers, that I am speaking with Zachary Waters or their authorized healthcare agent. I discussed the limitations, risks, security, and privacy concerns of performing an evaluation and management service by telephone and the potential availability of an in-person appointment in the future. The patient expressed understanding and agreed to proceed.  Location of Patient: Home Location of Provider (nurse):  Provider home.  Subjective:    Zachary Waters is a 80 y.o. male patient of Metheney, Rene Kocher, MD who had a Medicare Annual Wellness Visit today via telephone. Zachary Waters is Retired and lives with their spouse. he has 2 children. he reports that he is socially active and does interact with friends/family regularly. he is minimally physically active and enjoys yard work and golf.  Patient Care Team: Hali Marry, MD as PCP - General (Family Medicine) Marcy Panning (Pulmonary Disease) Milas Kocher (Chiropractic Medicine) Michael Boston, MD as Consulting Physician (General Surgery)  Advanced Directives 12/11/2020 03/10/2020 02/12/2019 02/06/2018 02/24/2017 04/21/2016 01/13/2015  Does Patient Have a Medical Advance Directive? Yes Yes Yes Yes No Yes Yes  Type of Advance Directive Living will;Healthcare Power of Attorney Living will;Healthcare Power of [place];Living will Living will;Healthcare Power of [place];Living will [place];Living will  Does patient want to make changes to medical advance directive? No - Patient declined - No - Patient declined - - - No - Patient declined  Copy of [place] in Chart? No - copy requested No - copy  requested No - copy requested No - copy requested - No - copy requested No - copy requested    Hospital Utilization Over the Past 12 Months: # of hospitalizations or ER visits: 1 # of surgeries: 1  Review of Systems    Patient reports that his overall health is unchanged compared to last year.  History obtained from chart review and the patient  Patient Reported Readings (BP, Pulse, CBG, Weight, etc) BP 122/73 HR 57 O2 96% Weight 164 Height 44fin  Pain Assessment Pain : No/denies pain     Current Medications & Allergies (verified) Allergies as of 12/11/2020       Reactions   No Known Allergies         Medication List        Accurate as of December 11, 2020 10:35 AM. If you have any questions, ask your nurse or doctor.          albuterol 108 (90 Base) MCG/ACT inhaler Commonly known as: VENTOLIN HFA Inhale 2 puffs into the lungs every 6 (six) hours as needed.   albuterol (5 MG/ML) 0.5% nebulizer solution Commonly known as: PROVENTIL Inhale into the lungs.   amoxicillin-clavulanate 875-125 MG tablet Commonly known as: AUGMENTIN Take 1 tablet by mouth 2 (two) times daily.   aspirin EC 81 MG tablet Take 81 mg by mouth daily.   atorvastatin 80 MG tablet Commonly known as: LIPITOR Take 80 mg by mouth daily.   budesonide-formoterol 160-4.5 MCG/ACT inhaler Commonly known as: SYMBICORT Inhale 2 puffs into the lungs 2 (two) times daily.   carvedilol 6.25 MG tablet Commonly known as: COREG Take 6.25 mg by mouth 2 (two) times daily.   clopidogrel 75 MG tablet Commonly known as: PLAVIX Take 75  mg by mouth daily.   ENTRESTO PO Take by mouth.   Fish Oil 1000 MG Caps Take 1 capsule by mouth daily.   levothyroxine 112 MCG tablet Commonly known as: SYNTHROID TAKE 1 TABLET BY MOUTH EVERY DAY BEFORE BREAKFAST   lisinopril 10 MG tablet Commonly known as: ZESTRIL Take 10 mg by mouth daily.   QUERCETIN PO Take 2 tablets by mouth daily. Pt reports taking 1  tablet daily   tiotropium 18 MCG inhalation capsule Commonly known as: Spiriva HandiHaler Place 1 capsule (18 mcg total) into inhaler and inhale daily. What changed: when to take this   vitamin C 1000 MG tablet Take 2,000 mg by mouth daily.   Wixela Inhub 100-50 MCG/ACT Aepb Generic drug: fluticasone-salmeterol Inhale 1 puff into the lungs 2 (two) times daily.        History (reviewed): Past Medical History:  Diagnosis Date   Cervical spondylosis with radiculopathy 03/08/2012   Cervical spondylosis with radiculopathy    COPD (chronic obstructive pulmonary disease) (HCC)    Dyspnea    Essential hypertension    GERD (gastroesophageal reflux disease) once in a great while   Hyperlipidemia 02/02/2018   Pneumonia 10/2009   Pneumonia 11/24/2020   Thyroid disease    Past Surgical History:  Procedure Laterality Date   CARDIAC CATHETERIZATION  11/30/2020   COLONOSCOPY     HERNIA REPAIR     inguinal hernia repair on right and left   INGUINAL HERNIA REPAIR Bilateral 04/21/2016   Procedure: LAPAROSCOPIC LEFT INGUINAL HERNIA REPAIR AND RIGHT FEMORAL HERNIA REPAIR;  Surgeon: Michael Boston, MD;  Location: [facility];  Service: General;  Laterality: Bilateral;   INSERTION OF MESH Bilateral 04/21/2016   Procedure: INSERTION OF MESH LEFT INGUINAL HERNIA  AND INSERTION OF MESH RIGHT FEMORAL HERNIA;  Surgeon: Michael Boston, MD;  Location: [facility];  Service: General;  Laterality: Bilateral;   LEG SURGERY Right    tumor removed as a child from femur?   ROTATOR CUFF REPAIR  2005   Left   TONSILLECTOMY     Family History  Problem Relation Age of Onset   Prostate cancer Father 22   Heart disease Mother 56       deceased age 33   Depression Brother        Commited suicide   Depression Brother    Social History   Socioeconomic History   Marital status: Married    Spouse name: [name]   Number of children: 2   Years of education: 14   Highest education level: Associate degree: academic  program  Occupational History   Occupation: retired    Comment: Producer, television/film/video  Tobacco Use   Smoking status: Former    Types: Pipe    Quit date: 10/07/2009    Years since quitting: 11.1   Smokeless tobacco: Never  Vaping Use   Vaping Use: Never used  Substance and Sexual Activity   Alcohol use: Yes    Comment: occasional   Drug use: No   Sexual activity: Never  Other Topics Concern   Not on file  Social History Narrative   Lives with his wife. He has two children. He is recovering from pneumonia and a recent cardiac cath. And is recovering and is not able to do much exercise or play golf.    Social Determinants of Health   Financial Resource Strain: Low Risk    Difficulty of Paying Living Expenses: Not hard at all  Food Insecurity: No Food Insecurity  Worried About Charity fundraiser in the Last Year: Never true   [place] in the Last Year: Never true  Transportation Needs: No Transportation Needs   Lack of Transportation (Medical): No   Lack of Transportation (Non-Medical): No  Physical Activity: Inactive   Days of Exercise per Week: 0 days   Minutes of Exercise per Session: 0 min  Stress: No Stress Concern Present   Feeling of Stress : Not at all  Social Connections: Socially Integrated   Frequency of Communication with Friends and Family: More than three times a week   Frequency of Social Gatherings with Friends and Family: Once a week   Attends Religious Services: More than 4 times per year   Active Member of [employer] or Organizations: Yes   Attends Archivist Meetings: More than 4 times per year   Marital Status: Married    Activities of Daily Living In your present state of health, do you have any difficulty performing the following activities: 12/11/2020  Hearing? N  Comment wears hearing aids.  Vision? N  Difficulty concentrating or making decisions? N  Walking or climbing stairs? N  Dressing or bathing? N  Doing errands, shopping? N   Preparing Food and eating ? N  Using the Toilet? N  In the past six months, have you accidently leaked urine? N  Do you have problems with loss of bowel control? N  Managing your Medications? N  Managing your Finances? N  Housekeeping or managing your Housekeeping? N  Some recent data might be hidden    Patient Education/ Literacy How often do you need to have someone help you when you read instructions, pamphlets, or other written materials from your doctor or pharmacy?: 1 - Never What is the last grade level you completed in school?: Associates Degree  Exercise Current Exercise Habits: The patient does not participate in regular exercise at present, Exercise limited by: cardiac condition(s)  Diet Patient reports consuming 3 meals a day and 0 snack(s) a day Patient reports that his primary diet is: Regular Patient reports that she does have regular access to food.   Depression Screen PHQ 2/9 Scores 12/11/2020 03/10/2020 02/12/2019 08/30/2018 02/06/2018 08/10/2017 02/02/2017  PHQ - 2 Score 0 0 0 0 0 0 0     Fall Risk Fall Risk  12/11/2020 03/10/2020 03/07/2019 02/12/2019 02/06/2018  Falls in the past year? 0 0 0 0 No  Number falls in past yr: 0 - 0 0 -  Injury with Fall? 0 - 0 0 -  Risk for fall due to : No Fall Risks No Fall Risks - - -  Follow up Falls evaluation completed - - Falls prevention discussed -     Objective:  Zachary Waters seemed alert and oriented and he participated appropriately during our telephone visit.  Blood Pressure Weight BMI  BP Readings from Last 3 Encounters:  10/01/20 (!) 137/57  07/13/20 (!) 154/66  05/22/20 136/84   Wt Readings from Last 3 Encounters:  10/01/20 166 lb (75.3 kg)  07/13/20 165 lb (74.8 kg)  05/22/20 166 lb (75.3 kg)   BMI Readings from Last 1 Encounters:  10/01/20 24.51 kg/m    *Unable to obtain current vital signs, weight, and BMI due to telephone visit type  Hearing/Vision  Zachary Waters did not seem to have difficulty with  hearing/understanding during the telephone conversation Reports that he has had a formal eye exam by an eye care professional within the past year Reports  that he has had a formal hearing evaluation within the past year *Unable to fully assess hearing and vision during telephone visit type  Cognitive Function: 6CIT Screen 12/11/2020 02/12/2019 02/06/2018 02/02/2017 02/03/2016  What Year? 0 points 0 points 0 points 0 points 0 points  What month? 0 points 0 points 0 points 0 points 0 points  What time? 0 points 0 points 0 points 0 points 0 points  Count back from 20 0 points 0 points 0 points 0 points 0 points  Months in reverse 0 points 0 points 0 points 0 points 0 points  Repeat phrase 0 points 0 points 0 points 2 points 2 points  Total Score 0 0 0 2 2   (Normal:0-7, Significant for Dysfunction: >8)  Normal Cognitive Function Screening: Yes   Immunization & Health Maintenance Record Immunization History  Administered Date(s) Administered   Fluad Quad(high Dose 65+) 02/12/2020   Influenza Split 02/06/2013   Influenza, High Dose Seasonal PF 01/24/2012, 02/04/2016, 02/02/2017, 02/06/2018, 02/05/2019   Influenza,inj,Quad PF,6+ Mos 02/12/2013, 01/09/2014, 01/13/2015, 02/07/2016   Moderna Sars-Covid-2 Vaccination 05/30/2019, 06/11/2019, 07/02/2019, 03/26/2020, 08/24/2020   Pneumococcal Conjugate-13 08/06/2013, 12/20/2013, 05/15/2019   Pneumococcal Polysaccharide-23 05/09/2010   Pneumococcal-Unspecified 06/09/2010   Tdap 01/03/2012   Zoster Recombinat (Shingrix) 04/13/2017   Zoster, Live 10/27/2010, 02/09/2017    Health Maintenance  Topic Date Due   Hepatitis C Screening  01/19/2021 (Originally 04/10/1959)   INFLUENZA VACCINE  01/27/2021 (Originally 12/07/2020)   Zoster Vaccines- Shingrix (2 of 2) 03/13/2021 (Originally 06/08/2017)   TETANUS/TDAP  01/02/2022   COLONOSCOPY (Pts 45-71yr Insurance coverage will need to be confirmed)  07/17/2022   COVID-19 Vaccine  Completed   PNA vac Low  Risk Adult  Completed   HPV VACCINES  Aged Out       Assessment  This is a routine wellness examination for Zachary Waters  Health Maintenance: Due or Overdue There are no preventive care reminders to display for this patient.  Zachary Waters does not need a referral for CCommercial Metals CompanyAssistance: Care Management:   no Social Work:    no Prescription Assistance:  no Nutrition/Diabetes Education:  no   Plan:  Personalized Goals  Goals Addressed               This Visit's Progress     Patient Stated (pt-stated)        12/11/2020 AWV Goal: Exercise for General Health  Patient will verbalize understanding of the benefits of increased physical activity: Exercising regularly is important. It will improve your overall fitness, flexibility, and endurance. Regular exercise also will improve your overall health. It can help you control your weight, reduce stress, and improve your bone density. Over the next year, patient will increase physical activity as tolerated with a goal of at least 150 minutes of moderate physical activity per week.  You can tell that you are exercising at a moderate intensity if your heart starts beating faster and you start breathing faster but can still hold a conversation. Moderate-intensity exercise ideas include: Walking 1 mile (1.6 km) in about 15 minutes Biking Hiking Golfing Dancing Water aerobics Patient will verbalize understanding of everyday activities that increase physical activity by providing examples like the following: Yard work, such as: PSales promotion account executiveGardening Washing windows or floors Patient will be able to explain general safety guidelines for exercising:  Before you start a new exercise program, talk with your health  care provider. Do not exercise so much that you hurt yourself, feel dizzy, or get very short of breath. Wear comfortable  clothes and wear shoes with good support. Drink plenty of water while you exercise to prevent dehydration or heat stroke. Work out until your breathing and your heartbeat get faster.        Personalized Health Maintenance & Screening Recommendations  Shingles vaccine-2nd dose  Lung Cancer Screening Recommended: no (Low Dose CT Chest recommended if Age 45-80 years, 30 pack-year currently smoking OR have quit w/in past 15 years) Hepatitis C Screening recommended: yes HIV Screening recommended: yes  Advanced Directives: Written information was not prepared per patient's request.  Referrals & Orders No orders of the defined types were placed in this encounter.   Follow-up Plan Follow-up with Hali Marry, MD as planned Please let us know the date for your shingles vaccine 2nd dose. Medicare wellness visit in one year.  AVS printed and mailed to the patient.   I have personally reviewed and noted the following in the patient's chart:   Medical and social history Use of alcohol, tobacco or illicit drugs  Current medications and supplements Functional ability and status Nutritional status Physical activity Advanced directives List of other physicians Hospitalizations, surgeries, and ER visits in previous 12 months Vitals Screenings to include cognitive, depression, and falls Referrals and appointments  In addition, I have reviewed and discussed with Zachary Waters certain preventive protocols, quality metrics, and best practice recommendations. A written personalized care plan for preventive services as well as general preventive health recommendations is available and can be mailed to the patient at his request.      Tinnie Gens, RN  12/11/2020

## 2020-12-17 ENCOUNTER — Encounter: Payer: Self-pay | Admitting: Family Medicine

## 2020-12-17 ENCOUNTER — Telehealth: Payer: Self-pay | Admitting: Family Medicine

## 2020-12-17 NOTE — Chronic Care Management (AMB) (Signed)
  Care Management  Note   12/17/2020 Name: Zachary Waters MRN: [id number] DOB: 1941/03/20  Zachary Waters is a 80 y.o. year old male who is a primary care patient of Metheney, Rene Kocher, MD. The care management team was consulted for assistance with chronic disease management and care coordination needs.   [name] was given information about Care Management services today including:  CCM service includes personalized support from designated clinical staff supervised by the physician, including individualized plan of care and coordination with other care providers 24/7 contact phone numbers for assistance for urgent and routine care needs. Service will only be billed when office clinical staff spend 20 minutes or more in a month to coordinate care. Only one practitioner may furnish and bill the service in a calendar month. The patient may stop CCM services at amy time (effective at the end of the month) by phone call to the office staff. The patient will be responsible for cost sharing (co-pay) or up to 20% of the service fee (after annual deductible is met)  Patient agreed to services and verbal consent obtained.  Follow up plan:   Face to Face appointment with care management team member scheduled for: 01/05/21 _0   Noelle Penner Upstream Scheduler

## 2020-12-23 ENCOUNTER — Telehealth: Payer: Self-pay | Admitting: Pharmacist

## 2020-12-23 NOTE — Chronic Care Management (AMB) (Signed)
Chronic Care Management Pharmacy Assistant   Name: Zachary Waters  MRN: [id number] DOB: 05-12-1940  Zachary Waters is an 80 y.o. year old male who presents for his initial CCM visit with the clinical pharmacist.  Recent office visits: 12/11/20 Beatrice Lecher MD(PCP)- patient was seen for Medicare annual. Follow up as planned.  10/01/20 Beatrice Lecher MD- Patient was seen for COPD exacerbation. Patient started Amoxicillin-Pot 875-125 mg BID and taper of Prednisone 20 mg. Patient completed course of  Doxycycline 100 mg. No follow up noted.  Recent consult visits:  01/08/21 Corazon Mulles- Pt seen for Hypothyroidism. Unable to view documentation.  01/05/21 Adnan Javaid (Pulm)- Pt was seen for 4 week f/u for COPD.A Pulm. Function test was performed. No follow up noted.  01/05/21 Gearlean Alf Ramirez-Lopez (Pulm)- Unable to view documentation.  12/08/20 Dionne Milo (Pulm)- Pt was seen for COPD hospital follow up. Chest xray was performed and peak flow was done. Follow up in 4 weeks.  10/20/20 Dionne Milo (Pulm)- Patient was seen for COPD. Unable to view documentation.  08/28/20 Ronnie Doss (Derm)- Unable to view documentation.  08/24/20 Wendie Chess (Cardiology) Patient was seen for ventricular tachycardia. Unable to view documentation.  Hospital visits:  Patient was seen for Respiratory Distress on 11/24/20 at [facility]. Patient was given Duo Neb x2 By EMS. No discharge summary documented.   Medications: Outpatient Encounter Medications as of 12/23/2020  Medication Sig   albuterol (PROVENTIL) (5 MG/ML) 0.5% nebulizer solution Inhale into the lungs.   albuterol (VENTOLIN HFA) 108 (90 Base) MCG/ACT inhaler Inhale 2 puffs into the lungs every 6 (six) hours as needed.   amoxicillin-clavulanate (AUGMENTIN) 875-125 MG tablet Take 1 tablet by mouth 2 (two) times daily. (Patient not taking: Reported on 12/11/2020)   Ascorbic Acid (VITAMIN C) 1000 MG tablet Take 2,000 mg  by mouth daily.   aspirin EC 81 MG tablet Take 81 mg by mouth daily.   atorvastatin (LIPITOR) 80 MG tablet Take 80 mg by mouth daily.   budesonide-formoterol (SYMBICORT) 160-4.5 MCG/ACT inhaler Inhale 2 puffs into the lungs 2 (two) times daily. (Patient not taking: Reported on 12/11/2020)   carvedilol (COREG) 6.25 MG tablet Take 6.25 mg by mouth 2 (two) times daily.   clopidogrel (PLAVIX) 75 MG tablet Take 75 mg by mouth daily.   fluticasone-salmeterol (WIXELA INHUB) 100-50 MCG/ACT AEPB Inhale 1 puff into the lungs 2 (two) times daily.   levothyroxine (SYNTHROID) 112 MCG tablet TAKE 1 TABLET BY MOUTH EVERY DAY BEFORE BREAKFAST   lisinopril (ZESTRIL) 10 MG tablet Take 10 mg by mouth daily. (Patient not taking: Reported on 12/11/2020)   Omega-3 Fatty Acids (FISH OIL) 1000 MG CAPS Take 1 capsule by mouth daily.   QUERCETIN PO Take 2 tablets by mouth daily. Pt reports taking 1 tablet daily   Sacubitril-Valsartan (ENTRESTO PO) Take by mouth.   tiotropium (SPIRIVA HANDIHALER) 18 MCG inhalation capsule Place 1 capsule (18 mcg total) into inhaler and inhale daily. (Patient taking differently: Place 18 mcg into inhaler and inhale 2 (two) times daily.)   No facility-administered encounter medications on file as of 12/23/2020.   Current Medication List albuterol (PROVENTIL) (5 MG/ML) 0.5%  albuterol (VENTOLIN HFA) 108 (90 Base) MCG/ACT  amoxicillin-clavulanate (AUGMENTIN) 875-125 MG last filled 10/01/20 7 DS Ascorbic Acid (VITAMIN C) 1000 MG  atorvastatin (LIPITOR) 80 MG last filled 12/25/20 90 DS carvedilol (COREG) 6.25 MG last filled 12/30/20 90 DS clopidogrel (PLAVIX) 75 MG last filled 12/29/20 90 DS fluticasone-salmeterol (WIXELA INHUB) 100-50  MCG  levothyroxine (SYNTHROID) 112 MCG last filled 10/08/20 90 DS lisinopril (ZESTRIL) 10 MG last filled 10/02/20 90 DS Omega-3 Fatty Acids (FISH OIL) 1000 MG CAPS QUERCETIN PO Sacubitril-Valsartan (ENTRESTO PO) last filled 11/30/20 90 DS   [facility] Clinical Pharmacist Assistant [phone]

## 2020-12-31 ENCOUNTER — Telehealth: Payer: Self-pay | Admitting: Family Medicine

## 2020-12-31 NOTE — Telephone Encounter (Signed)
Patient would like to cancel his appointment and reschedule.

## 2021-01-05 ENCOUNTER — Telehealth: Payer: Medicare Other

## 2021-01-05 DIAGNOSIS — J449 Chronic obstructive pulmonary disease, unspecified: Secondary | ICD-10-CM | POA: Diagnosis not present

## 2021-01-05 DIAGNOSIS — R918 Other nonspecific abnormal finding of lung field: Secondary | ICD-10-CM | POA: Diagnosis not present

## 2021-01-05 DIAGNOSIS — J9611 Chronic respiratory failure with hypoxia: Secondary | ICD-10-CM | POA: Diagnosis not present

## 2021-01-07 ENCOUNTER — Telehealth: Payer: Self-pay | Admitting: *Deleted

## 2021-01-07 NOTE — Chronic Care Management (AMB) (Signed)
  Chronic Care Management   Outreach Note  01/07/2021 Name: Colden Czerniak MRN: [id number] DOB: October 09, 1940  Alvaro Currin Hudnall is a 80 y.o. year old male who is a primary care patient of Madilyn Fireman, Rene Kocher, MD. I reached out to [employer] by phone today in response to a referral sent by Mr. Caren Macadam Kaney's PCP, Hali Marry, MD      An unsuccessful telephone outreach was attempted today. The patient was referred to the case management team for assistance with care management and care coordination.   Follow Up Plan: A HIPAA compliant phone message was left for the patient providing contact information and requesting a return call.  The care management team will reach out to the patient again over the next 7 days. If patient returns call to provider office, please advise to call [place] at [phone].  [facility] Management  Direct Dial: [phone]

## 2021-01-08 ENCOUNTER — Other Ambulatory Visit: Payer: Self-pay | Admitting: Family Medicine

## 2021-01-08 NOTE — Chronic Care Management (AMB) (Signed)
  Chronic Care Management   Note  01/08/2021 Name: Zachary Waters MRN: [id number] DOB: Jun 26, 1940  Zachary Waters is a 80 y.o. year old male who is a primary care patient of Madilyn Fireman, Rene Kocher, MD. I reached out to [employer] by phone today in response to a referral sent by Zachary Waters's Hali Marry, MD     Zachary Waters was given information about Chronic Care Management services today including:  CCM service includes personalized support from designated clinical staff supervised by his physician, including individualized plan of care and coordination with other care providers 24/7 contact phone numbers for assistance for urgent and routine care needs. Service will only be billed when office clinical staff spend 20 minutes or more in a month to coordinate care. Only one practitioner may furnish and bill the service in a calendar month. The patient may stop CCM services at any time (effective at the end of the month) by phone call to the office staff. The patient will be responsible for cost sharing (co-pay) of up to 20% of the service fee (after annual deductible is met).  Patient agreed to services and verbal consent obtained.   Follow up plan: Telephone appointment with care management team member scheduled for:01/29/21  [employer] Management  Direct Dial: [phone]

## 2021-01-08 NOTE — Telephone Encounter (Signed)
[facility]   Patient is consented and is rescheduled for you.  Thank you [place] Management  Direct Dial: [phone]

## 2021-01-29 ENCOUNTER — Other Ambulatory Visit: Payer: Self-pay

## 2021-01-29 ENCOUNTER — Ambulatory Visit (INDEPENDENT_AMBULATORY_CARE_PROVIDER_SITE_OTHER): Payer: Medicare Other | Admitting: Pharmacist

## 2021-01-29 DIAGNOSIS — R7301 Impaired fasting glucose: Secondary | ICD-10-CM

## 2021-01-29 DIAGNOSIS — I10 Essential (primary) hypertension: Secondary | ICD-10-CM

## 2021-01-29 DIAGNOSIS — J441 Chronic obstructive pulmonary disease with (acute) exacerbation: Secondary | ICD-10-CM

## 2021-01-29 DIAGNOSIS — E785 Hyperlipidemia, unspecified: Secondary | ICD-10-CM

## 2021-01-29 NOTE — Progress Notes (Signed)
Chronic Care Management Pharmacy Note  01/29/2021 Name:  Zachary Waters MRN:  [id number] DOB:  30-Jul-1940  Summary: addressed IFG, HTN, HLD, COPD  Recommendations/Changes made from today's visit: none  Plan: f/u with pharmacist in 6 months  Subjective: Zachary Waters is an 80 y.o. year old male who is a primary patient of Metheney, Rene Kocher, MD.  The CCM team was consulted for assistance with disease management and care coordination needs.    Engaged with patient by telephone for initial visit in response to provider referral for pharmacy case management and/or care coordination services.   Consent to Services:  The patient was given information about Chronic Care Management services, agreed to services, and gave verbal consent prior to initiation of services.  Please see initial visit note for detailed documentation.   Patient Care Team: Hali Marry, MD as PCP - General (Family Medicine) Marcy Panning (Pulmonary Disease) Milas Kocher (Chiropractic Medicine) Michael Boston, MD as Consulting Physician (General Surgery) Darius Bump, [facility] as Pharmacist (Pharmacist) Darius Bump, [facility] (Pharmacist)  Recent office visits: 12/11/20 Beatrice Lecher MD(PCP)- patient was seen for Medicare annual. Follow up as planned.   10/01/20 Beatrice Lecher MD- Patient was seen for COPD exacerbation. Patient started Amoxicillin-Pot 875-125 mg BID and taper of Prednisone 20 mg. Patient completed course of  Doxycycline 100 mg. No follow up noted.   Recent consult visits:  01/08/21 Corazon Mulles- Pt seen for Hypothyroidism. Unable to view documentation.   01/05/21 Adnan Javaid (Pulm)- Pt was seen for 4 week f/u for COPD.A Pulm. Function test was performed. No follow up noted.   01/05/21 Gearlean Alf Ramirez-Lopez (Pulm)- Unable to view documentation.   12/08/20 Dionne Milo (Pulm)- Pt was seen for COPD hospital follow up. Chest xray was performed and peak flow was done. Follow  up in 4 weeks.   10/20/20 Dionne Milo (Pulm)- Patient was seen for COPD. Unable to view documentation.   08/28/20 Ronnie Doss (Derm)- Unable to view documentation.   08/24/20 Wendie Chess (Cardiology) Patient was seen for ventricular tachycardia. Unable to view documentation.   Hospital visits:  Patient was seen for Respiratory Distress on 11/24/20 at [facility]. Patient was given Duo Neb x2 By EMS. No discharge summary documented.    Objective:  Lab Results  Component Value Date   CREATININE 1.04 03/13/2020   CREATININE 1.04 07/05/2018   CREATININE 1.04 01/30/2018    Lab Results  Component Value Date   HGBA1C 5.6 09/05/2019      Component Value Date/Time   CHOL 108 03/13/2020 0854   TRIG 55 03/13/2020 0854   HDL 52 03/13/2020 0854   CHOLHDL 2.1 03/13/2020 0854   VLDL 23 07/10/2014 0912   LDLCALC 42 03/13/2020 0854    Hepatic Function Latest Ref Rng & Units 03/13/2020 07/05/2018 01/30/2018  Total Protein 6.1 - 8.1 g/dL 6.3 6.2 6.1  Albumin - - - -  AST 10 - 35 U/L 18 16 18   ALT 9 - 46 U/L 17 14 13   Alk Phosphatase 25 - 125 U/L - - -  Total Bilirubin 0.2 - 1.2 mg/dL 0.6 0.4 0.3  Bilirubin, Direct 0.01 - 0.4 mg/dL - - -    Lab Results  Component Value Date/Time   TSH 1.38 03/13/2020 08:54 AM   TSH 1.09 03/08/2019 08:39 AM    CBC Latest Ref Rng & Units 03/13/2020 03/08/2019 07/05/2018  WBC 3.8 - 10.8 Thousand/uL 4.3 5.1 4.7  Hemoglobin 13.2 - 17.1 g/dL 13.4 15.2 14.5  Hematocrit 38.5 - 50.0 % 41.9 45.9 43.3  Platelets 140 - 400 Thousand/uL 188 246 234    Lab Results  Component Value Date/Time   VD25OH 35.00 12/22/2014 12:00 AM    Clinical ASCVD: Yes  The ASCVD Risk score (Arnett DK, et al., 2019) failed to calculate for the following reasons:   The patient has a prior MI or stroke diagnosis    Social History   Tobacco Use  Smoking Status Former   Types: Pipe   Quit date: 10/07/2009   Years since quitting: 11.3  Smokeless Tobacco Never   BP  Readings from Last 3 Encounters:  10/01/20 (!) 137/57  07/13/20 (!) 154/66  05/22/20 136/84   Pulse Readings from Last 3 Encounters:  10/01/20 (!) 52  07/13/20 63  05/22/20 (!) 57   Wt Readings from Last 3 Encounters:  10/01/20 166 lb (75.3 kg)  07/13/20 165 lb (74.8 kg)  05/22/20 166 lb (75.3 kg)    Assessment: Review of patient past medical history, allergies, medications, health status, including review of consultants reports, laboratory and other test data, was performed as part of comprehensive evaluation and provision of chronic care management services.   SDOH:  (Social Determinants of Health) assessments and interventions performed:    CCM Care Plan  Allergies  Allergen Reactions   No Known Allergies     Medications Reviewed Today     Reviewed by Darius Bump, [facility] (Pharmacist) on 01/29/21 at 1330  Med List Status: <None>   Medication Order Taking? Sig Documenting Provider Last Dose Status Informant  albuterol (PROVENTIL) (5 MG/ML) 0.5% nebulizer solution [id number] Yes Take 2.5 mg by nebulization every 6 (six) hours as needed. [provider] Taking Active   albuterol (VENTOLIN HFA) 108 (90 Base) MCG/ACT inhaler [id number] Yes Inhale 2 puffs into the lungs every 6 (six) hours as needed. [provider] Taking Active Self  Ascorbic Acid (VITAMIN C) 1000 MG tablet [id number] Yes Take 2,000 mg by mouth daily. [provider] Taking Active Self  aspirin EC 81 MG tablet [id number] Yes Take 81 mg by mouth daily. [provider] Taking Active   atorvastatin (LIPITOR) 80 MG tablet [id number] Yes Take 80 mg by mouth daily. [provider] Taking Active   carvedilol (COREG) 6.25 MG tablet [id number] Yes Take 6.25 mg by mouth 2 (two) times daily. Hayden Rasmussen IV, NP Taking Active   fluticasone-salmeterol (ADVAIR) 100-50 MCG/ACT AEPB [id number] Yes Inhale 1 puff into the lungs 2 (two) times daily. [provider] Taking Active    levothyroxine (SYNTHROID) 112 MCG tablet [id number] Yes TAKE 1 TABLET BY MOUTH EVERY DAY BEFORE BREAKFAST Hali Marry, MD Taking Active   Omega-3 Fatty Acids (FISH OIL) 1000 MG CAPS [id number] Yes Take 1 capsule by mouth daily. [provider] Taking Active Self  QUERCETIN PO [id number] Yes Take 1 tablet by mouth daily. Pt reports taking 1 tablet daily [provider] Taking Active Self  sacubitril-valsartan (ENTRESTO) 24-26 MG [id number]  Take 1 tablet by mouth in the morning and at bedtime. [provider]  Active   tiotropium (SPIRIVA HANDIHALER) 18 MCG inhalation capsule [id number] Yes Place 1 capsule (18 mcg total) into inhaler and inhale daily.  Patient taking differently: Place 18 mcg into inhaler and inhale 2 (two) times daily.   Hali Marry, MD Taking Active             Patient Active Problem List   Diagnosis Date Noted   History of  MI (myocardial infarction) 44/62/8638   Diastolic dysfunction without heart failure 01/20/2020   Coronary artery disease involving native coronary artery of native heart with angina pectoris ([place]) 01/11/2020   History of smoking 01/06/2020   Asymmetrical deep tendon reflexes 03/07/2019   Hyperlipidemia 02/02/2018   Pain in joint of right shoulder 02/26/2017   Coronary artery calcification seen on CAT scan 11/01/2016   History of adenomatous polyp of colon 06/15/2016   Recurrent left inguinal hernia s/p lap repair with mesh 04/21/2016 04/21/2016   Femoral hernia of right side s/p lap repair with mesh 04/21/2016 04/21/2016   IFG (impaired fasting glucose) 01/08/2016   Milk intolerance 09/11/2015   Thyroid activity decreased 09/11/2015   Centrilobular emphysema ([place]) 09/11/2015   Essential hypertension 07/30/2015   Cervical spondylosis with radiculopathy 03/08/2012   Left shoulder, arm, and hand pain 01/06/2012   Hypothyroid 10/20/2010   COPD, group D, by GOLD 2017 classification ([place]) 10/20/2010     Immunization History  Administered Date(s) Administered   Fluad Quad(high Dose 65+) 02/12/2020   Influenza Split 02/06/2013   Influenza, High Dose Seasonal PF 01/24/2012, 02/04/2016, 02/02/2017, 02/06/2018, 02/05/2019   Influenza,inj,Quad PF,6+ Mos 02/12/2013, 01/09/2014, 01/13/2015, 02/07/2016   Moderna Sars-Covid-2 Vaccination 05/30/2019, 06/11/2019, 07/02/2019, 03/26/2020, 08/24/2020   Pneumococcal Conjugate-13 08/06/2013, 12/20/2013, 05/15/2019   Pneumococcal Polysaccharide-23 05/09/2010   Pneumococcal-Unspecified 06/09/2010   Tdap 01/03/2012   Zoster Recombinat (Shingrix) 02/09/2017, 04/13/2017   Zoster, Live 10/27/2010, 02/09/2017    Conditions to be addressed/monitored: HTN, HLD, and IFG , COPD  Care Plan : Medication Management  Updates made by Darius Bump, [place] since 01/29/2021 12:00 AM     Problem: HTN, HLD, COPD, IFG      Long-Range Goal: Disease Progression Prevention   Start Date: 01/29/2021  This Visit's Progress: On track  Priority: High  Note:   Current Barriers:  None at present  Pharmacist Clinical Goal(s):  Over the next 180 days, patient will maintain control of chronic conditions as evidenced by medication fill history, lab values, and vital signs  through collaboration with PharmD and provider.   Interventions: 1:1 collaboration with Hali Marry, MD regarding development and update of comprehensive plan of care as evidenced by provider attestation and co-signature Inter-disciplinary care team collaboration (see longitudinal plan of care) Comprehensive medication review performed; medication list updated in electronic medical record  Impaired Fasting Glucose:  Controlled; current treatment:lifestyle modifications only; a1c 6.1   Current glucose readings: not currently checking, appropriate at this time  Denies hypoglycemic/hyperglycemic symptoms  Current meal patterns:to be discussed at next visit  Current exercise: to be discussed  at next visit  Educated on various numbers, a1c, glucose, etc and review of diet Recommended continue current regimen,  Hypertension: 01/05/20  Controlled; current treatment:coreg 6.69m BID, entresto 24-278mBID;   Current home readings: 126/78, HR 64. Normal SBP 120-130s/ DBP 60-70s and HR usual 60s  Denies hypotensive/hypertensive symptoms  Counseled on goal numbers Recommended continue current regimen,  Hyperlipidemia:  Controlled; current treatment:atorvastatin 8042maily, fish oil 1000m54mily;   Recommended continue current regimen Chronic Obstructive Pulmonary Disease:  Controlled; current treatment: wixela BID, spiriva once daily, albuterol PRN;   1-2 exacerbations requiring treatment in the last 6 months   Recommended continue current regimen   Patient Goals/Self-Care Activities Over the next  180 days, patient will:  take medications as prescribed  Follow Up Plan: Telephone follow up appointment with care management team member scheduled for:  6 months     Medication Assistance: None  required.  Patient affirms current coverage meets needs.  Patient's preferred pharmacy is:  [id number] [place] [address]Phone: [phone]: [phone] Uses pill box? Yes Pt endorses 100% compliance  Follow Up:  Patient agrees to Care Plan and Follow-up.  Plan: Telephone follow up appointment with care management team member scheduled for:  1 year  KDarius Bump

## 2021-01-29 NOTE — Patient Instructions (Signed)
Visit Information   PATIENT GOALS:   Goals Addressed             This Visit's Progress    Medication Management       Patient Goals/Self-Care Activities Over the next 365 days, patient will:  take medications as prescribed  Follow Up Plan: Telephone follow up appointment with care management team member scheduled for: 6 months        Consent to CCM Services: Mr. Guia was given information about Chronic Care Management services including:  CCM service includes personalized support from designated clinical staff supervised by his physician, including individualized plan of care and coordination with other care providers 24/7 contact phone numbers for assistance for urgent and routine care needs. Service will only be billed when office clinical staff spend 20 minutes or more in a month to coordinate care. Only one practitioner may furnish and bill the service in a calendar month. The patient may stop CCM services at any time (effective at the end of the month) by phone call to the office staff. The patient will be responsible for cost sharing (co-pay) of up to 20% of the service fee (after annual deductible is met).  Patient agreed to services and verbal consent obtained.   Patient verbalizes understanding of instructions provided today and agrees to view in MyChart.   Telephone follow up appointment with care management team member scheduled for: 6 months  CLINICAL CARE PLAN: Patient Care Plan: Medication Management     Problem Identified: HTN, HLD, COPD, IFG      Long-Range Goal: Disease Progression Prevention   Start Date: 01/29/2021  This Visit's Progress: On track  Priority: High  Note:   Current Barriers:  None at present  Pharmacist Clinical Goal(s):  Over the next 180 days, patient will maintain control of chronic conditions as evidenced by medication fill history, lab values, and vital signs  through collaboration with PharmD and provider.    Interventions: 1:1 collaboration with Metheney, Catherine D, MD regarding development and update of comprehensive plan of care as evidenced by provider attestation and co-signature Inter-disciplinary care team collaboration (see longitudinal plan of care) Comprehensive medication review performed; medication list updated in electronic medical record  Impaired Fasting Glucose:  Controlled; current treatment:lifestyle modifications only; a1c 6.1   Current glucose readings: not currently checking, appropriate at this time  Denies hypoglycemic/hyperglycemic symptoms  Current meal patterns:to be discussed at next visit  Current exercise: to be discussed at next visit  Educated on various numbers, a1c, glucose, etc and review of diet Recommended continue current regimen,  Hypertension: 01/05/20  Controlled; current treatment:coreg 6.25mg BID, entresto 24-26mg BID;   Current home readings: 126/78, HR 64. Normal SBP 120-130s/ DBP 60-70s and HR usual 60s  Denies hypotensive/hypertensive symptoms  Counseled on goal numbers Recommended continue current regimen,  Hyperlipidemia:  Controlled; current treatment:atorvastatin 80mg daily, fish oil 1000mg daily;   Recommended continue current regimen Chronic Obstructive Pulmonary Disease:  Controlled; current treatment: wixela BID, spiriva once daily, albuterol PRN;   1-2 exacerbations requiring treatment in the last 6 months   Recommended continue current regimen   Patient Goals/Self-Care Activities Over the next  180 days, patient will:  take medications as prescribed  Follow Up Plan: Telephone follow up appointment with care management team member scheduled for:  6 months      

## 2021-02-05 DIAGNOSIS — J441 Chronic obstructive pulmonary disease with (acute) exacerbation: Secondary | ICD-10-CM | POA: Diagnosis not present

## 2021-02-05 DIAGNOSIS — E785 Hyperlipidemia, unspecified: Secondary | ICD-10-CM

## 2021-02-05 DIAGNOSIS — I10 Essential (primary) hypertension: Secondary | ICD-10-CM

## 2021-02-08 DIAGNOSIS — I469 Cardiac arrest, cause unspecified: Secondary | ICD-10-CM | POA: Diagnosis not present

## 2021-02-08 DIAGNOSIS — I2510 Atherosclerotic heart disease of native coronary artery without angina pectoris: Secondary | ICD-10-CM | POA: Diagnosis not present

## 2021-02-08 DIAGNOSIS — I519 Heart disease, unspecified: Secondary | ICD-10-CM | POA: Diagnosis not present

## 2021-02-08 DIAGNOSIS — I10 Essential (primary) hypertension: Secondary | ICD-10-CM | POA: Diagnosis not present

## 2021-02-08 DIAGNOSIS — I472 Ventricular tachycardia, unspecified: Secondary | ICD-10-CM | POA: Diagnosis not present

## 2021-03-01 DIAGNOSIS — I428 Other cardiomyopathies: Secondary | ICD-10-CM | POA: Diagnosis not present

## 2021-03-10 ENCOUNTER — Encounter: Payer: Self-pay | Admitting: Family Medicine

## 2021-03-10 DIAGNOSIS — I428 Other cardiomyopathies: Secondary | ICD-10-CM | POA: Insufficient documentation

## 2021-03-11 ENCOUNTER — Ambulatory Visit (INDEPENDENT_AMBULATORY_CARE_PROVIDER_SITE_OTHER): Payer: Medicare Other | Admitting: Family Medicine

## 2021-03-11 ENCOUNTER — Encounter: Payer: Self-pay | Admitting: Family Medicine

## 2021-03-11 VITALS — BP 144/58 | HR 50 | Ht 69.0 in | Wt 172.0 lb

## 2021-03-11 DIAGNOSIS — I10 Essential (primary) hypertension: Secondary | ICD-10-CM

## 2021-03-11 DIAGNOSIS — R7301 Impaired fasting glucose: Secondary | ICD-10-CM

## 2021-03-11 DIAGNOSIS — J441 Chronic obstructive pulmonary disease with (acute) exacerbation: Secondary | ICD-10-CM | POA: Diagnosis not present

## 2021-03-11 DIAGNOSIS — E785 Hyperlipidemia, unspecified: Secondary | ICD-10-CM | POA: Diagnosis not present

## 2021-03-11 DIAGNOSIS — Z1159 Encounter for screening for other viral diseases: Secondary | ICD-10-CM | POA: Diagnosis not present

## 2021-03-11 MED ORDER — AMBULATORY NON FORMULARY MEDICATION: 0 refills | Status: DC

## 2021-03-11 NOTE — Progress Notes (Signed)
CPE  Subjective:  Patient ID: Zachary Waters, male    DOB: 28-Aug-1940  Age: 80 y.o. MRN: [id number]  CC:  Chief Complaint  Patient presents with   Annual Exam    HPI Zachary Waters presents for CPE.  He is still staying active, golfing twice a week.  He wears his oxygen only when he feels short of breath.  He does not wear it at night.  Usually if he feels more tired because he does not get a good night sleep those are the days where he struggles more with his breathing.  Since I last saw him he was hospitalized for 7 days over the summer in July with pneumonia.  He is doing okay overall just struggling more with having thicker mucus that he cannot seem to clear he says normally that is not a problem he can just easily clear it out but lately its been more of a struggle.  He still does not wear his oxygen every day just when he feels he needs it.  Though he did have to wear it twice this week when he went golfing he still doing that regularly.  He also goes to the [place] for care but wanted to let me know that his primary care provider there of 10 years has retired.  And his pulmonologist who was previously seeing there has left as well.  He does see Dr. Michela Pitcher outside of the [facility] for pulmonary care.  Past Medical History:  Diagnosis Date   Cervical spondylosis with radiculopathy 03/08/2012   Cervical spondylosis with radiculopathy    COPD (chronic obstructive pulmonary disease) (HCC)    Dyspnea    Essential hypertension    GERD (gastroesophageal reflux disease) once in a great while   Hyperlipidemia 02/02/2018   Pneumonia 10/2009   Pneumonia 11/24/2020   Thyroid disease     Past Surgical History:  Procedure Laterality Date   CARDIAC CATHETERIZATION  11/30/2020   COLONOSCOPY     HERNIA REPAIR     inguinal hernia repair on right and left   INGUINAL HERNIA REPAIR Bilateral 04/21/2016   Procedure: LAPAROSCOPIC LEFT INGUINAL HERNIA REPAIR AND RIGHT FEMORAL HERNIA REPAIR;   Surgeon: Michael Boston, MD;  Location: [place];  Service: General;  Laterality: Bilateral;   INSERTION OF MESH Bilateral 04/21/2016   Procedure: INSERTION OF MESH LEFT INGUINAL HERNIA  AND INSERTION OF MESH RIGHT FEMORAL HERNIA;  Surgeon: Michael Boston, MD;  Location: [place];  Service: General;  Laterality: Bilateral;   LEG SURGERY Right    tumor removed as a child from femur?   ROTATOR CUFF REPAIR  2005   Left   TONSILLECTOMY      Family History  Problem Relation Age of Onset   Prostate cancer Father 14   Heart disease Mother 21       deceased age 12   Depression Brother        Commited suicide   Depression Brother     Social History   Socioeconomic History   Marital status: Married    Spouse name: [name]   Number of children: 2   Years of education: 14   Highest education level: Associate degree: academic program  Occupational History   Occupation: retired    Comment: Producer, television/film/video  Tobacco Use   Smoking status: Former    Types: Pipe    Quit date: 10/07/2009    Years since quitting: 11.4   Smokeless tobacco: Never  Vaping Use  Vaping Use: Never used  Substance and Sexual Activity   Alcohol use: Yes    Comment: occasional   Drug use: No   Sexual activity: Never  Other Topics Concern   Not on file  Social History Narrative   Lives with his wife. He has two children. He is recovering from pneumonia and a recent cardiac cath. And is recovering and is not able to do much exercise or play golf.    Social Determinants of Health   Financial Resource Strain: Low Risk    Difficulty of Paying Living Expenses: Not hard at all  Food Insecurity: No Food Insecurity   Worried About Charity fundraiser in the Last Year: Never true   [place] in the Last Year: Never true  Transportation Needs: No Transportation Needs   Lack of Transportation (Medical): No   Lack of Transportation (Non-Medical): No  Physical Activity: Inactive   Days of Exercise per Week: 0 days   Minutes  of Exercise per Session: 0 min  Stress: No Stress Concern Present   Feeling of Stress : Not at all  Social Connections: Socially Integrated   Frequency of Communication with Friends and Family: More than three times a week   Frequency of Social Gatherings with Friends and Family: Once a week   Attends Religious Services: More than 4 times per year   Active Member of [employer] or Organizations: Yes   Attends Music therapist: More than 4 times per year   Marital Status: Married  Human resources officer Violence: Not At Risk   Fear of Current or Ex-Partner: No   Emotionally Abused: No   Physically Abused: No   Sexually Abused: No    Outpatient Medications Prior to Visit  Medication Sig Dispense Refill   albuterol (PROVENTIL) (5 MG/ML) 0.5% nebulizer solution Take 2.5 mg by nebulization every 6 (six) hours as needed.     albuterol (VENTOLIN HFA) 108 (90 Base) MCG/ACT inhaler Inhale 2 puffs into the lungs every 6 (six) hours as needed.     Ascorbic Acid (VITAMIN C) 1000 MG tablet Take 2,000 mg by mouth daily.     aspirin EC 81 MG tablet Take 81 mg by mouth daily.     atorvastatin (LIPITOR) 80 MG tablet Take 80 mg by mouth daily.  3   carvedilol (COREG) 6.25 MG tablet Take 6.25 mg by mouth 2 (two) times daily.     fluticasone-salmeterol (WIXELA INHUB) 100-50 MCG/ACT AEPB Inhale 2 puffs into the lungs 2 (two) times daily.     levothyroxine (SYNTHROID) 112 MCG tablet TAKE 1 TABLET BY MOUTH EVERY DAY BEFORE BREAKFAST 90 tablet 1   Omega-3 Fatty Acids (FISH OIL) 1000 MG CAPS Take 1 capsule by mouth daily.     QUERCETIN PO Take 1 tablet by mouth daily. Pt reports taking 1 tablet daily     tiotropium (SPIRIVA HANDIHALER) 18 MCG inhalation capsule Place 1 capsule (18 mcg total) into inhaler and inhale daily. (Patient taking differently: Place 18 mcg into inhaler and inhale 2 (two) times daily.) 1 capsule 0   fluticasone-salmeterol (ADVAIR) 100-50 MCG/ACT AEPB Inhale 1 puff into the lungs 2 (two)  times daily.     sacubitril-valsartan (ENTRESTO) 24-26 MG Take 1 tablet by mouth in the morning and at bedtime.     No facility-administered medications prior to visit.    Allergies  Allergen Reactions   No Known Allergies     ROS Review of Systems    Objective:  Physical Exam  BP (!) 144/58   Pulse (!) 50   Ht 5\' 9"  (1.753 m)   Wt 172 lb (78 kg)   SpO2 98%   BMI 25.40 kg/m  Wt Readings from Last 3 Encounters:  03/11/21 172 lb (78 kg)  10/01/20 166 lb (75.3 kg)  07/13/20 165 lb (74.8 kg)     Health Maintenance Due  Topic Date Due   Hepatitis C Screening  Never done    There are no preventive care reminders to display for this patient.  Lab Results  Component Value Date   TSH 1.38 03/13/2020   Lab Results  Component Value Date   WBC 4.3 03/13/2020   HGB 13.4 03/13/2020   HCT 41.9 03/13/2020   MCV 94.2 03/13/2020   PLT 188 03/13/2020   Lab Results  Component Value Date   NA 140 03/13/2020   K 5.0 03/13/2020   CO2 31 03/13/2020   GLUCOSE 96 03/13/2020   BUN 17 03/13/2020   CREATININE 1.04 03/13/2020   BILITOT 0.6 03/13/2020   ALKPHOS 66 12/28/2015   AST 18 03/13/2020   ALT 17 03/13/2020   PROT 6.3 03/13/2020   ALBUMIN 4.0 12/22/2014   CALCIUM 9.1 03/13/2020   ANIONGAP 8 04/21/2016   Lab Results  Component Value Date   CHOL 108 03/13/2020   Lab Results  Component Value Date   HDL 52 03/13/2020   Lab Results  Component Value Date   LDLCALC 42 03/13/2020   Lab Results  Component Value Date   TRIG 55 03/13/2020   Lab Results  Component Value Date   CHOLHDL 2.1 03/13/2020   Lab Results  Component Value Date   HGBA1C 5.6 09/05/2019      Assessment & Plan:   Problem List Items Addressed This Visit       Cardiovascular and Mediastinum   Essential hypertension - Primary     Endocrine   IFG (impaired fasting glucose)   Relevant Orders   COMPLETE METABOLIC PANEL WITH GFR   Lipid panel   CBC   TSH   Hepatitis C Antibody      Other   Hyperlipidemia   Relevant Orders   COMPLETE METABOLIC PANEL WITH GFR   Lipid panel   CBC   TSH   Hepatitis C Antibody   Other Visit Diagnoses     Encounter for hepatitis C screening test for low risk patient       Relevant Orders   COMPLETE METABOLIC PANEL WITH GFR   Lipid panel   CBC   TSH   Hepatitis C Antibody   Chronic obstructive pulmonary disease with acute exacerbation (HCC)       Relevant Medications   fluticasone-salmeterol (WIXELA INHUB) 100-50 MCG/ACT AEPB   AMBULATORY NON FORMULARY MEDICATION      CPE - Keep up a regular exercise program and make sure you are eating a healthy diet Try to eat 4 servings of dairy a day, or if you are lactose intolerant take a calcium with vitamin D daily.  Your vaccines are up to date.  Will get up to date labs today.  Health maintenance is otherwise up-to-date.  For excess thick mucus-recommend drinking plenty of fluids, consider using it mucolytic such as Mucinex, using a humidifier now that it is cooler and the heat is running in the air is little bit more dry.  Also recommend using his flutter valve he thinks he still has 1 at home this can be really helpful as  well.  Meds ordered this encounter  Medications   AMBULATORY NON FORMULARY MEDICATION    Sig: Medication Name: Overnight pulse oximetry patient has a history of COPD.  Please fax to his DME supplier.    Dispense:  1 Units    Refill:  0     Follow-up: Return in about 1 year (around 03/11/2022) for CPE.    Beatrice Lecher, MD

## 2021-03-12 ENCOUNTER — Encounter: Payer: Self-pay | Admitting: Family Medicine

## 2021-03-12 LAB — TSH: TSH: 0.4 mIU/L (ref 0.40–4.50)

## 2021-03-12 LAB — CBC
HCT: 44.8 % (ref 38.5–50.0)
Hemoglobin: 14.6 g/dL (ref 13.2–17.1)
MCH: 31.3 pg (ref 27.0–33.0)
MCHC: 32.6 g/dL (ref 32.0–36.0)
MCV: 96.1 fL (ref 80.0–100.0)
MPV: 10.5 fL (ref 7.5–12.5)
Platelets: 202 10*3/uL (ref 140–400)
RBC: 4.66 10*6/uL (ref 4.20–5.80)
RDW: 12.2 % (ref 11.0–15.0)
WBC: 6 10*3/uL (ref 3.8–10.8)

## 2021-03-12 LAB — COMPLETE METABOLIC PANEL WITH GFR
AG Ratio: 2.1 (calc) (ref 1.0–2.5)
ALT: 20 U/L (ref 9–46)
AST: 22 U/L (ref 10–35)
Albumin: 4.6 g/dL (ref 3.6–5.1)
Alkaline phosphatase (APISO): 70 U/L (ref 35–144)
BUN/Creatinine Ratio: 21 (calc) (ref 6–22)
BUN: 27 mg/dL — ABNORMAL HIGH (ref 7–25)
CO2: 27 mmol/L (ref 20–32)
Calcium: 9.6 mg/dL (ref 8.6–10.3)
Chloride: 104 mmol/L (ref 98–110)
Creat: 1.27 mg/dL (ref 0.70–1.28)
Globulin: 2.2 g/dL (calc) (ref 1.9–3.7)
Glucose, Bld: 92 mg/dL (ref 65–139)
Potassium: 5.7 mmol/L — ABNORMAL HIGH (ref 3.5–5.3)
Sodium: 140 mmol/L (ref 135–146)
Total Bilirubin: 0.6 mg/dL (ref 0.2–1.2)
Total Protein: 6.8 g/dL (ref 6.1–8.1)
eGFR: 58 mL/min/{1.73_m2} — ABNORMAL LOW (ref >=60)

## 2021-03-12 LAB — LIPID PANEL
Cholesterol: 114 mg/dL (ref <200)
HDL: 56 mg/dL (ref >=40)
LDL Cholesterol (Calc): 45 mg/dL (calc)
Non-HDL Cholesterol (Calc): 58 mg/dL (calc) (ref <130)
Total CHOL/HDL Ratio: 2 (calc) (ref <5.0)
Triglycerides: 52 mg/dL (ref <150)

## 2021-03-12 LAB — HEPATITIS C ANTIBODY
Hepatitis C Ab: NONREACTIVE
SIGNAL TO CUT-OFF: 0.04 (ref <1.00)

## 2021-03-12 NOTE — Progress Notes (Signed)
Hi Zachary Waters, kidney function is up a little bit at 1.2.  Normally you are closer to 1.0 but you also look a little bit more dry on your blood work.  Just make sure you are hydrating well.  Potassium was also elevated.  This is a little higher than your usual so I do want a recheck that in 1 to 2 weeks just to make sure there is trending back down.  Your cholesterol looks great LDL is under 70 which is perfect.  Blood count is normal.  Negative for hepatitis C.  Just keep an eye on your blood pressure and let us know if it is doing a little better at home.

## 2021-04-20 DIAGNOSIS — J9611 Chronic respiratory failure with hypoxia: Secondary | ICD-10-CM | POA: Diagnosis not present

## 2021-04-20 DIAGNOSIS — R918 Other nonspecific abnormal finding of lung field: Secondary | ICD-10-CM | POA: Diagnosis not present

## 2021-04-20 DIAGNOSIS — J449 Chronic obstructive pulmonary disease, unspecified: Secondary | ICD-10-CM | POA: Diagnosis not present

## 2021-05-10 DIAGNOSIS — I472 Ventricular tachycardia, unspecified: Secondary | ICD-10-CM | POA: Diagnosis not present

## 2021-06-14 DIAGNOSIS — I2510 Atherosclerotic heart disease of native coronary artery without angina pectoris: Secondary | ICD-10-CM | POA: Diagnosis not present

## 2021-06-17 ENCOUNTER — Other Ambulatory Visit: Payer: Self-pay | Admitting: Family Medicine

## 2021-07-01 ENCOUNTER — Telehealth: Payer: Self-pay | Admitting: *Deleted

## 2021-07-01 NOTE — Chronic Care Management (AMB) (Signed)
°  Care Management   Note  07/01/2021 Name: Zachary Waters MRN: [id number] DOB: 1940-12-05  Zachary Waters is a 81 y.o. year old male who is a primary care patient of Hali Marry, MD and is actively engaged with the care management team. I reached out to [employer] by phone today to assist with re-scheduling a follow up visit with the Pharmacist  Follow up plan: Unsuccessful telephone outreach attempt made. A HIPAA compliant phone message was left for the patient providing contact information and requesting a return call.   Julian Hy, [place] Management  Direct Dial: [phone]

## 2021-07-13 NOTE — Chronic Care Management (AMB) (Signed)
?  Care Management  ? ?Note ? ?07/13/2021 ?Name: Zachary Waters MRN: [id number] DOB: Dec 25, 1940 ? ?Zachary Waters is a 81 y.o. year old male who is a primary care patient of Hali Marry, MD and is actively engaged with the care management team. I reached out to [employer] by phone today to assist with re-scheduling a follow up visit with the Pharmacist ? ?Follow up plan: ?2nd Unsuccessful telephone outreach attempt made. A HIPAA compliant phone message was left for the patient providing contact information and requesting a return call.  ? ?Aubrei Bouchie, CCMA ?Care Guide, Embedded Care Coordination ?[place]  Care Management  ?Direct Dial: [phone] ? ? ?

## 2021-07-18 ENCOUNTER — Encounter: Payer: Self-pay | Admitting: Family Medicine

## 2021-07-20 DIAGNOSIS — J441 Chronic obstructive pulmonary disease with (acute) exacerbation: Secondary | ICD-10-CM | POA: Diagnosis not present

## 2021-07-20 DIAGNOSIS — J9611 Chronic respiratory failure with hypoxia: Secondary | ICD-10-CM | POA: Diagnosis not present

## 2021-07-20 DIAGNOSIS — R918 Other nonspecific abnormal finding of lung field: Secondary | ICD-10-CM | POA: Diagnosis not present

## 2021-07-20 NOTE — Telephone Encounter (Signed)
Contacted patient and have him scheduled for 07/21/2021 at 11:30 ?

## 2021-07-21 ENCOUNTER — Ambulatory Visit (INDEPENDENT_AMBULATORY_CARE_PROVIDER_SITE_OTHER): Payer: Medicare Other | Admitting: Family Medicine

## 2021-07-21 ENCOUNTER — Other Ambulatory Visit: Payer: Self-pay

## 2021-07-21 ENCOUNTER — Encounter: Payer: Self-pay | Admitting: Family Medicine

## 2021-07-21 VITALS — BP 130/78 | HR 52 | Ht 69.0 in | Wt 175.0 lb

## 2021-07-21 DIAGNOSIS — J441 Chronic obstructive pulmonary disease with (acute) exacerbation: Secondary | ICD-10-CM | POA: Diagnosis not present

## 2021-07-21 NOTE — Progress Notes (Signed)
? ?Acute Office Visit ? ?Subjective:  ? ? Patient ID: Zachary Waters, male    DOB: November 02, 1940, 81 y.o.   MRN: [id number] ? ?Chief Complaint  ?Patient presents with  ? Follow-up  ? ? ?HPI ?Patient is in today for COPD exacerbation.   ? ?He was seen by pulmonary yesterday.  He was given Solu-Medrol 40 mg and Rocephin 1 gram yesterday. Given azithromycin and prednisone taper.  He says he is actually feeling much better today he still has a persistent cough.  He has been getting dark yellow phlegm.  No fevers or chills.  He says his runny nose is actually a lot better today.  He did do a walk test yesterday as well and he said he did not perform very well.  He had to give up golf for the winter but is hopeful he will get to play again in the spring.  He says he feels like it might be getting to the point where he may not be able to play golf anymore.  He did start going back to the fitness center to exercise he has not been able to walk because his oxygen drops even while he is on oxygen.  But he has been able to do the stationary bike. ? ?Past Medical History:  ?Diagnosis Date  ? Cervical spondylosis with radiculopathy 03/08/2012  ? Cervical spondylosis with radiculopathy   ? COPD (chronic obstructive pulmonary disease) ([place])   ? Dyspnea   ? Essential hypertension   ? GERD (gastroesophageal reflux disease) once in a great while  ? Hyperlipidemia 02/02/2018  ? Pneumonia 10/2009  ? Pneumonia 11/24/2020  ? Thyroid disease   ? ? ?Past Surgical History:  ?Procedure Laterality Date  ? CARDIAC CATHETERIZATION  11/30/2020  ? COLONOSCOPY    ? HERNIA REPAIR    ? inguinal hernia repair on right and left  ? INGUINAL HERNIA REPAIR Bilateral 04/21/2016  ? Procedure: LAPAROSCOPIC LEFT INGUINAL HERNIA REPAIR AND RIGHT FEMORAL HERNIA REPAIR;  Surgeon: Michael Boston, MD;  Location: [facility];  Service: General;  Laterality: Bilateral;  ? INSERTION OF MESH Bilateral 04/21/2016  ? Procedure: INSERTION OF MESH LEFT INGUINAL HERNIA  AND  INSERTION OF MESH RIGHT FEMORAL HERNIA;  Surgeon: Michael Boston, MD;  Location: [facility];  Service: General;  Laterality: Bilateral;  ? LEG SURGERY Right   ? tumor removed as a child from femur?  ? [place]  2005  ? Left  ? TONSILLECTOMY    ? ? ?Family History  ?Problem Relation Age of Onset  ? Prostate cancer Father 28  ? Heart disease Mother 51  ?     deceased age 61  ? Depression Brother   ?     Commited suicide  ? Depression Brother   ? ? ?Social History  ? ?Socioeconomic History  ? Marital status: Married  ?  Spouse name: [name]  ? Number of children: 2  ? Years of education: 77  ? Highest education level: Associate degree: academic program  ?Occupational History  ? Occupation: retired  ?  Comment: IT specialist  ?Tobacco Use  ? Smoking status: Former  ?  Types: Pipe  ?  Quit date: 10/07/2009  ?  Years since quitting: 11.7  ? Smokeless tobacco: Never  ?Vaping Use  ? Vaping Use: Never used  ?Substance and Sexual Activity  ? Alcohol use: Yes  ?  Comment: occasional  ? Drug use: No  ? Sexual activity: Never  ?Other Topics Concern  ?  Not on file  ?Social History Narrative  ? Lives with his wife. He has two children. He is recovering from pneumonia and a recent cardiac cath. And is recovering and is not able to do much exercise or play golf.   ? ?Social Determinants of Health  ? ?Financial Resource Strain: Low Risk   ? Difficulty of Paying Living Expenses: Not hard at all  ?Food Insecurity: No Food Insecurity  ? Worried About Charity fundraiser in the Last Year: Never true  ? Ran Out of Food in the Last Year: Never true  ?Transportation Needs: No Transportation Needs  ? Lack of Transportation (Medical): No  ? Lack of Transportation (Non-Medical): No  ?Physical Activity: Inactive  ? Days of Exercise per Week: 0 days  ? Minutes of Exercise per Session: 0 min  ?Stress: No Stress Concern Present  ? Feeling of Stress : Not at all  ?Social Connections: Socially Integrated  ? Frequency of Communication with Friends  and Family: More than three times a week  ? Frequency of Social Gatherings with Friends and Family: Once a week  ? Attends Religious Services: More than 4 times per year  ? Active Member of Clubs or Organizations: Yes  ? Attends Archivist Meetings: More than 4 times per year  ? Marital Status: Married  ?Intimate Partner Violence: Not At Risk  ? Fear of Current or Ex-Partner: No  ? Emotionally Abused: No  ? Physically Abused: No  ? Sexually Abused: No  ? ? ?Outpatient Medications Prior to Visit  ?Medication Sig Dispense Refill  ? albuterol (PROVENTIL) (5 MG/ML) 0.5% nebulizer solution Take 2.5 mg by nebulization every 6 (six) hours as needed.    ? albuterol (VENTOLIN HFA) 108 (90 Base) MCG/ACT inhaler Inhale 2 puffs into the lungs every 6 (six) hours as needed.    ? AMBULATORY NON FORMULARY MEDICATION Medication Name: Overnight pulse oximetry patient has a history of COPD.  Please fax to his DME supplier. 1 Units 0  ? Ascorbic Acid (VITAMIN C) 1000 MG tablet Take 2,000 mg by mouth daily.    ? aspirin EC 81 MG tablet Take 81 mg by mouth daily.    ? atorvastatin (LIPITOR) 80 MG tablet Take 80 mg by mouth daily.  3  ? azithromycin (ZITHROMAX) 500 MG tablet Take 500 mg by mouth daily.    ? carvedilol (COREG) 6.25 MG tablet Take 6.25 mg by mouth 2 (two) times daily.    ? fluticasone-salmeterol (ADVAIR) 100-50 MCG/ACT AEPB Inhale 2 puffs into the lungs 2 (two) times daily.    ? levothyroxine (SYNTHROID) 112 MCG tablet TAKE 1 TABLET BY MOUTH EVERY DAY BEFORE BREAKFAST 90 tablet 1  ? Omega-3 Fatty Acids (FISH OIL) 1000 MG CAPS Take 1 capsule by mouth daily.    ? predniSONE (DELTASONE) 10 MG tablet Take by mouth.    ? QUERCETIN PO Take 1 tablet by mouth daily. Pt reports taking 1 tablet daily    ? tiotropium (SPIRIVA HANDIHALER) 18 MCG inhalation capsule Place 1 capsule (18 mcg total) into inhaler and inhale daily. (Patient taking differently: Place 18 mcg into inhaler and inhale 2 (two) times daily.) 1 capsule 0   ? ?No facility-administered medications prior to visit.  ? ? ?Allergies  ?Allergen Reactions  ? No Known Allergies   ? ? ?Review of Systems ? ?   ?Objective:  ?  ?Physical Exam ?Constitutional:   ?   Appearance: He is well-developed.  ?HENT:  ?  Head: Normocephalic and atraumatic.  ?   Right Ear: Ear canal and external ear normal.  ?   Left Ear: Ear canal and external ear normal.  ?   Ears:  ?   Comments: Right TM blocked by cerumen. Left TM is partially blocked by cerumen.  ?   Nose: Nose normal.  ?Eyes:  ?   Conjunctiva/sclera: Conjunctivae normal.  ?   Pupils: Pupils are equal, round, and reactive to light.  ?Neck:  ?   Thyroid: No thyromegaly.  ?Cardiovascular:  ?   Rate and Rhythm: Normal rate.  ?   Heart sounds: Normal heart sounds.  ?Pulmonary:  ?   Effort: Pulmonary effort is normal.  ?   Breath sounds: Normal breath sounds.  ?   Comments: Dec air movement at the bases bilateral. No crackle or rhonchi today./   ?Musculoskeletal:  ?   Cervical back: Neck supple.  ?Lymphadenopathy:  ?   Cervical: No cervical adenopathy.  ?Skin: ?   General: Skin is warm and dry.  ?Neurological:  ?   Mental Status: He is alert and oriented to person, place, and time.  ? ? ?BP 130/78   Pulse (!) 52   Ht 5' 9"  (1.753 m)   Wt 175 lb (79.4 kg)   SpO2 95%   BMI 25.84 kg/m?  ?Wt Readings from Last 3 Encounters:  ?07/21/21 175 lb (79.4 kg)  ?03/11/21 172 lb (78 kg)  ?10/01/20 166 lb (75.3 kg)  ? ? ?There are no preventive care reminders to display for this patient. ? ?There are no preventive care reminders to display for this patient. ? ? ?Lab Results  ?Component Value Date  ? TSH 0.40 03/11/2021  ? ?Lab Results  ?Component Value Date  ? WBC 6.0 03/11/2021  ? HGB 14.6 03/11/2021  ? HCT 44.8 03/11/2021  ? MCV 96.1 03/11/2021  ? PLT 202 03/11/2021  ? ?Lab Results  ?Component Value Date  ? NA 140 03/11/2021  ? K 5.7 (H) 03/11/2021  ? CO2 27 03/11/2021  ? GLUCOSE 92 03/11/2021  ? BUN 27 (H) 03/11/2021  ? CREATININE 1.27 03/11/2021   ? BILITOT 0.6 03/11/2021  ? ALKPHOS 66 12/28/2015  ? AST 22 03/11/2021  ? ALT 20 03/11/2021  ? PROT 6.8 03/11/2021  ? ALBUMIN 4.0 12/22/2014  ? CALCIUM 9.6 03/11/2021  ? ANIONGAP 8 04/21/2016  ? EGFR 58 (L) 11/

## 2021-07-21 NOTE — Chronic Care Management (AMB) (Signed)
?  Care Management  ? ?Note ? ?07/21/2021 ?Name: Zachary Waters MRN: [id number] DOB: 1941/04/19 ? ?Zachary Waters is a 81 y.o. year old male who is a primary care patient of Hali Marry, MD and is actively engaged with the care management team. I reached out to [employer] by phone today to assist with re-scheduling a follow up visit with the Pharmacist ? ?Follow up plan: ?Telephone appointment with care management team member scheduled for: 10/21/2021 ? ?Jillane Po, CCMA ?Care Guide, Embedded Care Coordination ?[place]  Care Management  ?Direct Dial: [phone] ? ? ?

## 2021-07-29 ENCOUNTER — Telehealth: Payer: Medicare Other

## 2021-08-09 DIAGNOSIS — I472 Ventricular tachycardia, unspecified: Secondary | ICD-10-CM | POA: Diagnosis not present

## 2021-08-23 DIAGNOSIS — J449 Chronic obstructive pulmonary disease, unspecified: Secondary | ICD-10-CM | POA: Diagnosis not present

## 2021-08-23 DIAGNOSIS — R918 Other nonspecific abnormal finding of lung field: Secondary | ICD-10-CM | POA: Diagnosis not present

## 2021-08-23 DIAGNOSIS — J9611 Chronic respiratory failure with hypoxia: Secondary | ICD-10-CM | POA: Diagnosis not present

## 2021-10-02 ENCOUNTER — Encounter: Payer: Self-pay | Admitting: Emergency Medicine

## 2021-10-02 ENCOUNTER — Emergency Department (INDEPENDENT_AMBULATORY_CARE_PROVIDER_SITE_OTHER): Payer: Medicare Other

## 2021-10-02 ENCOUNTER — Emergency Department (INDEPENDENT_AMBULATORY_CARE_PROVIDER_SITE_OTHER)
Admission: EM | Admit: 2021-10-02 | Discharge: 2021-10-02 | Disposition: A | Discharge status: Home / Self Care | Payer: Medicare Other | Source: Home / Self Care

## 2021-10-02 DIAGNOSIS — I700 Atherosclerosis of aorta: Secondary | ICD-10-CM | POA: Diagnosis not present

## 2021-10-02 DIAGNOSIS — J441 Chronic obstructive pulmonary disease with (acute) exacerbation: Secondary | ICD-10-CM | POA: Diagnosis not present

## 2021-10-02 DIAGNOSIS — R0602 Shortness of breath: Secondary | ICD-10-CM

## 2021-10-02 DIAGNOSIS — J439 Emphysema, unspecified: Secondary | ICD-10-CM | POA: Diagnosis not present

## 2021-10-02 MED ORDER — METHYLPREDNISOLONE SODIUM SUCC 125 MG IJ SOLR
125.0000 mg | Freq: Once | INTRAMUSCULAR | Status: AC
  Administered 2021-10-02: 125 mg via INTRAMUSCULAR

## 2021-10-02 MED ORDER — IPRATROPIUM BROMIDE 0.02 % IN SOLN: 0.5000 mg | Freq: Four times a day (QID) | RESPIRATORY_TRACT | 12 refills | Status: DC

## 2021-10-02 MED ORDER — PREDNISONE 50 MG PO TABS: ORAL_TABLET | ORAL | 0 refills | Status: DC

## 2021-10-02 NOTE — ED Triage Notes (Addendum)
SOB w/ cough since last night  DOE - O2 sats w/ walking 83-84% at home O2 sats  normally 91-92% per pt  No home COVID tests on O2 prn - currently at 2 LNC Nebulizer at 0900 Usually has pneumonia when it flares up like this  Denies fever  Here with wife

## 2021-10-02 NOTE — ED Provider Notes (Signed)
[place]   MRN: [id number] DOB: [birth year]  Subjective:   Chief Complaint; SOB SOB w/ cough since last night  DOE - O2 sats w/ walking 83-84% at home O2 sats  normally 91-92% per pt  No home COVID tests on O2 prn - currently at 2 LNC Nebulizer at 0900 Usually has pneumonia when it flares up like this  Denies fever  Here with wife   Anan Dapolito is a 81 y.o. male with history of hypertension, hyperlipidemia, COPD with home O2 as needed presenting for increasing shortness of breath on ambulation down to mid 80s.  Patient normally sats 91 to 92% at home.  Patient states he feels now like he usually does when he gets pneumonia.  He denies fever but has had some chills.  Patient denies chest pain.  He has been using his albuterol at home via nebulizer  No current facility-administered medications for this encounter.  Current Outpatient Medications:    ipratropium (ATROVENT) 0.02 % nebulizer solution, Take 2.5 mLs (0.5 mg total) by nebulization 4 (four) times daily., Disp: 75 mL, Rfl: 12   predniSONE (DELTASONE) 50 MG tablet, Take 1 pill daily for 5 days as directed, Disp: 5 tablet, Rfl: 0   albuterol (PROVENTIL) (5 MG/ML) 0.5% nebulizer solution, Take 2.5 mg by nebulization every 6 (six) hours as needed., Disp: , Rfl:    albuterol (VENTOLIN HFA) 108 (90 Base) MCG/ACT inhaler, Inhale 2 puffs into the lungs every 6 (six) hours as needed., Disp: , Rfl:    AMBULATORY NON FORMULARY MEDICATION, Medication Name: Overnight pulse oximetry patient has a history of COPD.  Please fax to his DME supplier., Disp: 1 Units, Rfl: 0   Ascorbic Acid (VITAMIN C) 1000 MG tablet, Take 2,000 mg by mouth daily., Disp: , Rfl:    aspirin EC 81 MG tablet, Take 81 mg by mouth daily., Disp: , Rfl:    atorvastatin (LIPITOR) 80 MG tablet, Take 80 mg by mouth daily., Disp: , Rfl: 3   carvedilol (COREG) 6.25 MG tablet, Take 6.25 mg by mouth 2 (two) times daily., Disp: , Rfl:     fluticasone-salmeterol (ADVAIR) 100-50 MCG/ACT AEPB, Inhale 2 puffs into the lungs 2 (two) times daily., Disp: , Rfl:    levothyroxine (SYNTHROID) 112 MCG tablet, TAKE 1 TABLET BY MOUTH EVERY DAY BEFORE BREAKFAST, Disp: 90 tablet, Rfl: 1   Omega-3 Fatty Acids (FISH OIL) 1000 MG CAPS, Take 1 capsule by mouth daily., Disp: , Rfl:    QUERCETIN PO, Take 1 tablet by mouth daily. Pt reports taking 1 tablet daily, Disp: , Rfl:    tiotropium (SPIRIVA HANDIHALER) 18 MCG inhalation capsule, Place 1 capsule (18 mcg total) into inhaler and inhale daily. (Patient taking differently: Place 18 mcg into inhaler and inhale 2 (two) times daily.), Disp: 1 capsule, Rfl: 0   No Known Allergies   Past Medical History:  Diagnosis Date   Cervical spondylosis with radiculopathy 03/08/2012   Cervical spondylosis with radiculopathy    COPD (chronic obstructive pulmonary disease) (H[facility])    Dyspnea    Essential hypertension    GERD (gastroesophageal reflux disease) once in a great while   Hyperlipidemia 02/02/2018   Pneumonia 10/2009   Pneumonia 11/24/2020   Thyroid disease      Review of Systems  All other systems reviewed and are negative.   Objective:   Vitals: BP (!) 163/72 (BP Location: Left Arm)   Pulse 61   Temp 99.2 F (37.3 C) (  Oral)   Resp (!) 28   Ht '5\' 9"'$  (1.753 m)   Wt 170 lb (77.1 kg)   SpO2 97%   BMI 25.10 kg/m   Physical Exam Vitals and nursing note reviewed.  Constitutional:      General: He is not in acute distress.    Appearance: He is well-developed.  HENT:     Head: Normocephalic and atraumatic.     Nose: No congestion.  Eyes:     Conjunctiva/sclera: Conjunctivae normal.  Cardiovascular:     Rate and Rhythm: Normal rate and regular rhythm.     Heart sounds: No murmur heard. Pulmonary:     Effort: Pulmonary effort is normal. No respiratory distress.     Breath sounds: Rhonchi present. No wheezing or rales.     Comments: Mild diminished bs in bases- rhonchi upper airway  clear on cough- mild tachypnea and accessory muscle use-  Abdominal:     Palpations: Abdomen is soft.     Tenderness: There is no abdominal tenderness.  Musculoskeletal:        General: No swelling.     Cervical back: Neck supple.  Skin:    General: Skin is warm and dry.     Capillary Refill: Capillary refill takes less than 2 seconds.  Neurological:     Mental Status: He is alert.  Psychiatric:        Mood and Affect: Mood normal.    No results found for this or any previous visit (from the past 24 hour(s)).  DG Chest 2 View  Result Date: 10/02/2021 CLINICAL DATA:  Increased shortness of breath EXAM: CHEST - 2 VIEW COMPARISON:  None Available. FINDINGS: The heart size and mediastinal contours are within normal limits. Atherosclerotic calcification of the aortic arch. Pacemaker lead terminating in the right ventricle. Emphysematous changes. No focal consolidation or pleural effusion. The visualized skeletal structures are unremarkable. IMPRESSION: No active cardiopulmonary disease. Electronically Signed   By: Keane Police D.O.   On: 10/02/2021 11:47       Assessment and Plan :   1. COPD exacerbation ([place])     Meds ordered this encounter  Medications   methylPREDNISolone sodium succinate (SOLU-MEDROL) 125 mg/2 mL injection 125 mg   ipratropium (ATROVENT) 0.02 % nebulizer solution    Sig: Take 2.5 mLs (0.5 mg total) by nebulization 4 (four) times daily.    Dispense:  75 mL    Refill:  12   predniSONE (DELTASONE) 50 MG tablet    Sig: Take 1 pill daily for 5 days as directed    Dispense:  5 tablet    Refill:  0    MDM:  Caillou Minus is a 81 y.o. male with history of COPD chronic using home O2 as needed presenting for increased shortness of breath for the last couple of days despite current treatment.  His physical exam was unremarkable except for a few upper airway rhonchi that cleared with cough.  Chest x-ray showed no evidence of acute infection.  We will trial  Solu-Medrol 125 IM injection given in clinic today and sent home on prednisone 50 daily for 5 days.  He will continue his as needed oxygen to prevent hypoxia he will go to the ER if worse or new symptoms for further diagnostics.  I discussed treatment, follow up and return instructions. Questions were answered. Patient/representative stated understanding of instructions and patient is stable for discharge.  Leida Lauth FNP-C MCN    Hezzie Bump, NP 10/02/21  1213  

## 2021-10-02 NOTE — Discharge Instructions (Addendum)
Use the Atrovent and with the albuterol when using your inhaler every 4 hours.  Add steroid.  You were given Solu-Medrol injection today to help open your lungs decrease the shortness of breath.  Go to emergency room if not improving.  Your chest x-ray today showed no signs of pneumonia.  Continue on oxygen at 2 L until improving

## 2021-10-03 ENCOUNTER — Encounter: Payer: Self-pay | Admitting: Family Medicine

## 2021-10-05 ENCOUNTER — Encounter: Payer: Self-pay | Admitting: Family Medicine

## 2021-10-05 ENCOUNTER — Ambulatory Visit (INDEPENDENT_AMBULATORY_CARE_PROVIDER_SITE_OTHER): Payer: Medicare Other | Admitting: Family Medicine

## 2021-10-05 VITALS — BP 161/76 | HR 61 | Resp 18 | Ht 69.0 in | Wt 177.0 lb

## 2021-10-05 DIAGNOSIS — J432 Centrilobular emphysema: Secondary | ICD-10-CM

## 2021-10-05 DIAGNOSIS — J441 Chronic obstructive pulmonary disease with (acute) exacerbation: Secondary | ICD-10-CM

## 2021-10-05 MED ORDER — DOXYCYCLINE HYCLATE 100 MG PO TABS: 100.0000 mg | ORAL_TABLET | Freq: Two times a day (BID) | ORAL | 0 refills | Status: DC

## 2021-10-05 MED ORDER — PREDNISONE 20 MG PO TABS: ORAL_TABLET | ORAL | 0 refills | Status: AC

## 2021-10-05 MED ORDER — AMBULATORY NON FORMULARY MEDICATION: 0 refills | Status: DC

## 2021-10-05 NOTE — Assessment & Plan Note (Signed)
He has had 2 flares in a very short period of time.  I do think maybe adjusting his medications could be helpful and there are other options if he continues to have more frequent flares such as putting him on triweekly azithromycin.  For now I want him to use his albuterol liberally.  I did give him an antibiotic today if the cough persist or he feels like he is getting more or change in sputum production.  We also discussed getting home oxygen instead of just his portable that he uses when he leaves the house.

## 2021-10-05 NOTE — Telephone Encounter (Signed)
LVM for patient to call back to get patient scheduled. AMUCK

## 2021-10-05 NOTE — Progress Notes (Signed)
DOE - O2 sats w/ walking 83-84%  O2 sats w/Oxygen 94

## 2021-10-05 NOTE — Progress Notes (Signed)
Established Patient Office Visit  Subjective   Patient ID: Zachary Waters, male    DOB: 03-05-1941  Age: 81 y.o. MRN: [id number]  Chief Complaint  Patient presents with   Follow-up    COPD. Patient states he received a steroid shot and prednisone on Friday. Patient stated his COPD flare ups are coming more often.     HPI   Here today for follow-up for ED visit for acute COPD exacerbation on May 27.  He started experiencing significant shortness of breath and drop in oxygen without his oxygen concentrator on.  Oxygen was dropping into the low 80s with minimal effort.  He really was not having significant cough or sputum production at that time.  Chest x-ray negative for pneumonia.  He was given methylprednisolone injection and prednisone for 5 days.  He is feeling slightly better but he is concerned that this is his second flare in the last 2 months.  Normally he would get 2 flares per year.  So he is worried that this is a sign that his COPD is progressing.  Plus the last time he saw his pulmonologist he had a decline in his pulmonary function.  He does follow-up next month with Dr. Michela Pitcher for some repeat testing.  Dr. Michela Pitcher had wanted to change one of his nebulizer treatments but he has had tricky time try to get it approved through the [place].  He did give up golfing last December.  He does have a little bit of a cough here in the office today and has noticed that his sputum is a dark [place] color.  Home COVID test yesterday was negative.  No fever.  Now he only uses a portable concentrator he actually does not have home oxygen.      ROS    Objective:     BP (!) 161/76   Pulse 61   Resp 18   Ht _0  (1.753 m)   Wt 177 lb (80.3 kg)   SpO2 94%   BMI 26.14 kg/m    Physical Exam Constitutional:      Appearance: He is well-developed.  HENT:     Head: Normocephalic and atraumatic.  Cardiovascular:     Rate and Rhythm: Normal rate and regular rhythm.     Heart sounds:  Normal heart sounds.  Pulmonary:     Effort: Pulmonary effort is normal.     Comments: Slight expiratory wheeze in the left lung upper and lower.  No crackles.  Diffuse decreased air movement. Skin:    General: Skin is warm and dry.  Neurological:     Mental Status: He is alert and oriented to person, place, and time.  Psychiatric:        Behavior: Behavior normal.     No results found for any visits on 10/05/21.  Last metabolic panel Lab Results  Component Value Date   GLUCOSE 92 03/11/2021   NA 140 03/11/2021   K 5.7 (H) 03/11/2021   CL 104 03/11/2021   CO2 27 03/11/2021   BUN 27 (H) 03/11/2021   CREATININE 1.27 03/11/2021   EGFR 58 (L) 03/11/2021   CALCIUM 9.6 03/11/2021   PROT 6.8 03/11/2021   ALBUMIN 4.0 12/22/2014   BILITOT 0.6 03/11/2021   ALKPHOS 66 12/28/2015   AST 22 03/11/2021   ALT 20 03/11/2021   ANIONGAP 8 04/21/2016      The ASCVD Risk score (Arnett DK, et al., 2019) failed to calculate for the following reasons:  The patient has a prior MI or stroke diagnosis    Assessment & Plan:   Problem List Items Addressed This Visit       Respiratory   Centrilobular emphysema ([place])    He has had 2 flares in a very short period of time.  I do think maybe adjusting his medications could be helpful and there are other options if he continues to have more frequent flares such as putting him on triweekly azithromycin.  For now I want him to use his albuterol liberally.  I did give him an antibiotic today if the cough persist or he feels like he is getting more or change in sputum production.  We also discussed getting home oxygen instead of just his portable that he uses when he leaves the house.       Relevant Medications   predniSONE (DELTASONE) 20 MG tablet   AMBULATORY NON FORMULARY MEDICATION   Other Visit Diagnoses     COPD exacerbation ([place])    -  Primary   Relevant Medications   predniSONE (DELTASONE) 20 MG tablet   Chronic obstructive pulmonary  disease with acute exacerbation ([facility])       Relevant Medications   predniSONE (DELTASONE) 20 MG tablet   AMBULATORY NON FORMULARY MEDICATION       No follow-ups on file.    Beatrice Lecher, MD

## 2021-10-14 DIAGNOSIS — J441 Chronic obstructive pulmonary disease with (acute) exacerbation: Secondary | ICD-10-CM | POA: Diagnosis not present

## 2021-10-14 DIAGNOSIS — J432 Centrilobular emphysema: Secondary | ICD-10-CM | POA: Diagnosis not present

## 2021-10-14 DIAGNOSIS — J449 Chronic obstructive pulmonary disease, unspecified: Secondary | ICD-10-CM | POA: Diagnosis not present

## 2021-10-21 ENCOUNTER — Ambulatory Visit (INDEPENDENT_AMBULATORY_CARE_PROVIDER_SITE_OTHER): Payer: Medicare Other | Admitting: Pharmacist

## 2021-10-21 DIAGNOSIS — I10 Essential (primary) hypertension: Secondary | ICD-10-CM

## 2021-10-21 DIAGNOSIS — J432 Centrilobular emphysema: Secondary | ICD-10-CM

## 2021-10-21 DIAGNOSIS — R7301 Impaired fasting glucose: Secondary | ICD-10-CM

## 2021-10-21 DIAGNOSIS — E785 Hyperlipidemia, unspecified: Secondary | ICD-10-CM

## 2021-10-21 NOTE — Progress Notes (Signed)
Chronic Care Management Pharmacy Note  10/21/2021 Name:  Zachary Waters MRN:  [id number] DOB:  1940-11-09  Summary: addressed IFG, HTN, HLD, COPD. Extensive discussion about COPD progression, logistics of arranging his care between [place], pulmonologists, and oxygen use.  BP home readings: 120-130s/60-70s, usually.  BP check this AM: 130/68 HR 65.  Recommendations/Changes made from today's visit: none, patient has appropriate plans in place for when to seek care if breathing condition worsens.  Plan: f/u with pharmacist in 6 months  Subjective: Zachary Waters is an 81 y.o. year old male who is a primary patient of Metheney, Rene Kocher, MD.  The CCM team was consulted for assistance with disease management and care coordination needs.    Engaged with patient by telephone for follow up visit in response to provider referral for pharmacy case management and/or care coordination services.   Consent to Services:  The patient was given information about Chronic Care Management services, agreed to services, and gave verbal consent prior to initiation of services.  Please see initial visit note for detailed documentation.   Patient Care Team: Hali Marry, MD as PCP - General (Family Medicine) Marcy Panning (Pulmonary Disease) Milas Kocher (Chiropractic Medicine) Michael Boston, MD as Consulting Physician (General Surgery) Darius Bump, [facility] as Pharmacist (Pharmacist) Darius Bump, [facility] (Pharmacist)  Recent office visits: 12/11/20 Beatrice Lecher MD(PCP)- patient was seen for Medicare annual. Follow up as planned.   10/01/20 Beatrice Lecher MD- Patient was seen for COPD exacerbation. Patient started Amoxicillin-Pot 875-125 mg BID and taper of Prednisone 20 mg. Patient completed course of  Doxycycline 100 mg. No follow up noted.   Recent consult visits:  01/08/21 Corazon Mulles- Pt seen for Hypothyroidism. Unable to view documentation.   01/05/21 Adnan Javaid  (Pulm)- Pt was seen for 4 week f/u for COPD.A Pulm. Function test was performed. No follow up noted.   01/05/21 Gearlean Alf Ramirez-Lopez (Pulm)- Unable to view documentation.   12/08/20 Dionne Milo (Pulm)- Pt was seen for COPD hospital follow up. Chest xray was performed and peak flow was done. Follow up in 4 weeks.   10/20/20 Dionne Milo (Pulm)- Patient was seen for COPD. Unable to view documentation.   08/28/20 Ronnie Doss (Derm)- Unable to view documentation.   08/24/20 Wendie Chess (Cardiology) Patient was seen for ventricular tachycardia. Unable to view documentation.   Hospital visits:  Patient was seen for Respiratory Distress on 11/24/20 at [facility]. Patient was given Duo Neb x2 By EMS. No discharge summary documented.    Objective:  Lab Results  Component Value Date   CREATININE 1.27 03/11/2021   CREATININE 1.04 03/13/2020   CREATININE 1.04 07/05/2018    Lab Results  Component Value Date   HGBA1C 5.6 09/05/2019      Component Value Date/Time   CHOL 114 03/11/2021 0000   TRIG 52 03/11/2021 0000   HDL 56 03/11/2021 0000   CHOLHDL 2.0 03/11/2021 0000   VLDL 23 07/10/2014 0912   LDLCALC 45 03/11/2021 0000       Latest Ref Rng & Units 03/11/2021   12:00 AM 03/13/2020    8:54 AM 07/05/2018   11:42 AM  Hepatic Function  Total Protein 6.1 - 8.1 g/dL 6.8  6.3  6.2   AST 10 - 35 U/L '22  18  16   '$ ALT 9 - 46 U/L '20  17  14   '$ Total Bilirubin 0.2 - 1.2 mg/dL 0.6  0.6  0.4     Lab  Results  Component Value Date/Time   TSH 0.40 03/11/2021 12:00 AM   TSH 1.38 03/13/2020 08:54 AM       Latest Ref Rng & Units 03/11/2021   12:00 AM 03/13/2020    8:54 AM 03/08/2019    8:39 AM  CBC  WBC 3.8 - 10.8 Thousand/uL 6.0  4.3  5.1   Hemoglobin 13.2 - 17.1 g/dL 14.6  13.4  15.2   Hematocrit 38.5 - 50.0 % 44.8  41.9  45.9   Platelets 140 - 400 Thousand/uL 202  188  246     Lab Results  Component Value Date/Time   VD25OH 35.00 12/22/2014 12:00 AM    Clinical  ASCVD: Yes  The ASCVD Risk score (Arnett DK, et al., 2019) failed to calculate for the following reasons:   The patient has a prior MI or stroke diagnosis    Social History   Tobacco Use  Smoking Status Former   Types: Pipe   Quit date: 10/07/2009   Years since quitting: 12.0  Smokeless Tobacco Never   BP Readings from Last 3 Encounters:  10/05/21 (!) 161/76  10/02/21 (!) 163/72  07/21/21 130/78   Pulse Readings from Last 3 Encounters:  10/05/21 61  10/02/21 61  07/21/21 (!) 52   Wt Readings from Last 3 Encounters:  10/05/21 177 lb (80.3 kg)  10/02/21 170 lb (77.1 kg)  07/21/21 175 lb (79.4 kg)    Assessment: Review of patient past medical history, allergies, medications, health status, including review of consultants reports, laboratory and other test data, was performed as part of comprehensive evaluation and provision of chronic care management services.   SDOH:  (Social Determinants of Health) assessments and interventions performed:    CCM Care Plan  No Known Allergies   Medications Reviewed Today     Reviewed by Darius Bump, [facility] (Pharmacist) on 10/21/21 at 1314  Med List Status: <None>   Medication Order Taking? Sig Documenting Provider Last Dose Status Informant  albuterol (PROVENTIL) (5 MG/ML) 0.5% nebulizer solution [id number] Yes Take 2.5 mg by nebulization every 6 (six) hours as needed. [provider] Taking Active   albuterol (VENTOLIN HFA) 108 (90 Base) MCG/ACT inhaler [id number] Yes Inhale 2 puffs into the lungs every 6 (six) hours as needed. [provider] Taking Active Self  Camillo Flaming MEDICATION [id number] Yes Medication Name: Overnight pulse oximetry patient has a history of COPD.  Please fax to his DME supplier. Hali Marry, MD Taking Active   Camillo Flaming MEDICATION [id number] Yes Medication Name: Oxygen and supplies, 2 liters/min.  Severe COPD. Hali Marry, MD Taking Active   Ascorbic  Acid (VITAMIN C) 1000 MG tablet [id number] Yes Take 2,000 mg by mouth daily. [provider] Taking Active Self  aspirin EC 81 MG tablet [id number] Yes Take 81 mg by mouth daily. [provider] Taking Active   atorvastatin (LIPITOR) 80 MG tablet [id number] Yes Take 80 mg by mouth daily. [provider] Taking Active   carvedilol (COREG) 6.25 MG tablet [id number] Yes Take 6.25 mg by mouth 2 (two) times daily. Hayden Rasmussen IV, NP Taking Active   fluticasone-salmeterol (ADVAIR) 100-50 MCG/ACT AEPB [id number] Yes Inhale 2 puffs into the lungs 2 (two) times daily. [provider] Taking Active   levothyroxine (SYNTHROID) 112 MCG tablet [id number] Yes TAKE 1 TABLET BY MOUTH EVERY DAY BEFORE BREAKFAST Hali Marry, MD Taking Active   Omega-3 Fatty Acids (FISH OIL) 1000 MG CAPS [id number] Yes Take  1 capsule by mouth daily. [provider] Taking Active Self  QUERCETIN PO [id number] Yes Take 1 tablet by mouth daily. Pt reports taking 1 tablet daily [provider] Taking Active Self  tiotropium (SPIRIVA HANDIHALER) 18 MCG inhalation capsule [id number] Yes Place 1 capsule (18 mcg total) into inhaler and inhale daily.  Patient taking differently: Place 18 mcg into inhaler and inhale 2 (two) times daily.   Hali Marry, MD Taking Active             Patient Active Problem List   Diagnosis Date Noted   Nonischemic cardiomyopathy ([place]) 03/10/2021   History of MI (myocardial infarction) 05/39/7673   Diastolic dysfunction without heart failure 01/20/2020   Coronary artery disease involving native coronary artery of native heart with angina pectoris ([place]) 01/11/2020   History of smoking 01/06/2020   Asymmetrical deep tendon reflexes 03/07/2019   Hyperlipidemia 02/02/2018   Pain in joint of right shoulder 02/26/2017   Coronary artery calcification seen on CAT scan 11/01/2016   History of adenomatous polyp of colon 06/15/2016   Recurrent  left inguinal hernia s/p lap repair with mesh 04/21/2016 04/21/2016   Femoral hernia of right side s/p lap repair with mesh 04/21/2016 04/21/2016   IFG (impaired fasting glucose) 01/08/2016   Milk intolerance 09/11/2015   Thyroid activity decreased 09/11/2015   Centrilobular emphysema ([place]) 09/11/2015   Essential hypertension 07/30/2015   Cervical spondylosis with radiculopathy 03/08/2012   Left shoulder, arm, and hand pain 01/06/2012   Hypothyroid 10/20/2010   COPD, group D, by GOLD 2017 classification ([place]) 10/20/2010    Immunization History  Administered Date(s) Administered   Fluad Quad(high Dose 65+) 02/12/2020, 02/24/2021   Influenza Split 02/06/2013   Influenza, High Dose Seasonal PF 01/24/2012, 02/04/2016, 02/02/2017, 02/06/2018, 02/05/2019   Influenza,inj,Quad PF,6+ Mos 02/12/2013, 01/09/2014, 01/13/2015, 02/07/2016   Moderna Covid-19 Vaccine Bivalent Booster 58yr & up 02/24/2021   Moderna Sars-Covid-2 Vaccination 05/30/2019, 06/11/2019, 07/02/2019, 03/26/2020, 08/24/2020   Pneumococcal Conjugate-13 08/06/2013, 12/20/2013, 05/15/2019   Pneumococcal Polysaccharide-23 05/09/2010   Pneumococcal-Unspecified 06/09/2010   Tdap 01/03/2012   Zoster Recombinat (Shingrix) 02/09/2017, 04/13/2017   Zoster, Live 10/27/2010, 02/09/2017    Conditions to be addressed/monitored: HTN, HLD, and IFG , COPD  Care Plan : Medication Management  Updates made by KDarius Bump RTatumsince 10/21/2021 12:00 AM     Problem: HTN, HLD, COPD, IFG      Long-Range Goal: Disease Progression Prevention   Start Date: 01/29/2021  Recent Progress: On track  Priority: High  Note:   Current Barriers:  None at present  Pharmacist Clinical Goal(s):  Over the next 180 days, patient will maintain control of chronic conditions as evidenced by medication fill history, lab values, and vital signs  through collaboration with PharmD and provider.   Interventions: 1:1 collaboration with MHali Marry MD regarding development and update of comprehensive plan of care as evidenced by provider attestation and co-signature Inter-disciplinary care team collaboration (see longitudinal plan of care) Comprehensive medication review performed; medication list updated in electronic medical record  Impaired Fasting Glucose:  Controlled; current treatment:lifestyle modifications only;   Current glucose readings: not currently checking, appropriate at this time  Denies hypoglycemic/hyperglycemic symptoms  Current meal patterns:to be discussed at next visit  Current exercise: to be discussed at next visit  Educated on various numbers, a1c, glucose, etc and review of diet Recommended continue current regimen,  Hypertension: 01/05/20  Controlled; current treatment:coreg 6.'25mg'$  BID;   Current home readings: 126/78, HR 64.  Normal SBP 120-130s/ DBP 60-70s and HR usual 60s  Denies hypotensive/hypertensive symptoms  Counseled on goal numbers Recommended continue current regimen,  Hyperlipidemia:  Controlled; current treatment:atorvastatin '80mg'$  daily, fish oil '1000mg'$  daily;   Recommended continue current regimen Chronic Obstructive Pulmonary Disease:  Controlled; current treatment: advair BID, spiriva once daily, albuterol PRN;   2 exacerbations requiring treatment in the last 6 months   Recommended continue current regimen   Patient Goals/Self-Care Activities Over the next  180 days, patient will:  take medications as prescribed  Follow Up Plan: Telephone follow up appointment with care management team member scheduled for:  6 months      Medication Assistance: None required.  Patient affirms current coverage meets needs.  Patient's [facility] pharmacy is:  [id number] [id number]: [phone]: [phone]  Uses pill box? Yes Pt endorses 100% compliance  Follow Up:  Patient agrees to Care  Plan and Follow-up.  Plan: Telephone follow up appointment with care management team member scheduled for:  6 months  KLarinda Buttery PharmD Clinical Pharmacist [phone]

## 2021-10-21 NOTE — Patient Instructions (Signed)
Visit Information  Thank you for taking time to visit with me today. Please don't hesitate to contact me if I can be of assistance to you before our next scheduled telephone appointment.  Following are the goals we discussed today:  Patient Goals/Self-Care Activities Over the next 180 days, patient will:  take medications as prescribed  Follow Up Plan: Telephone follow up appointment with care management team member scheduled for: 6 months  Please call the care guide team at [phone] if you need to cancel or reschedule your appointment.   Patient verbalizes understanding of instructions and care plan provided today and agrees to view in MyChart. Active MyChart status and patient understanding of how to access instructions and care plan via MyChart confirmed with patient.     Zachary Waters Zachary Waters  

## 2021-10-27 ENCOUNTER — Telehealth: Payer: Self-pay | Admitting: Family Medicine

## 2021-10-27 NOTE — Telephone Encounter (Signed)
Pt dropped by today.  He left paperwork for handicapp placard. Paper work placed in Dr. Gardiner Ramus basket on 6/21.

## 2021-10-27 NOTE — Telephone Encounter (Signed)
Completed and placed in [place] B's basket.

## 2021-10-28 NOTE — Telephone Encounter (Signed)
Pt informed that his form is completed and he can p/u

## 2021-11-05 DIAGNOSIS — I10 Essential (primary) hypertension: Secondary | ICD-10-CM

## 2021-11-05 DIAGNOSIS — J449 Chronic obstructive pulmonary disease, unspecified: Secondary | ICD-10-CM

## 2021-11-05 DIAGNOSIS — E785 Hyperlipidemia, unspecified: Secondary | ICD-10-CM

## 2021-11-08 DIAGNOSIS — I472 Ventricular tachycardia, unspecified: Secondary | ICD-10-CM | POA: Diagnosis not present

## 2021-11-11 DIAGNOSIS — J449 Chronic obstructive pulmonary disease, unspecified: Secondary | ICD-10-CM | POA: Diagnosis not present

## 2021-11-11 DIAGNOSIS — R058 Other specified cough: Secondary | ICD-10-CM | POA: Diagnosis not present

## 2021-11-13 DIAGNOSIS — J441 Chronic obstructive pulmonary disease with (acute) exacerbation: Secondary | ICD-10-CM | POA: Diagnosis not present

## 2021-11-13 DIAGNOSIS — J432 Centrilobular emphysema: Secondary | ICD-10-CM | POA: Diagnosis not present

## 2021-11-13 DIAGNOSIS — J449 Chronic obstructive pulmonary disease, unspecified: Secondary | ICD-10-CM | POA: Diagnosis not present

## 2021-11-29 DIAGNOSIS — J449 Chronic obstructive pulmonary disease, unspecified: Secondary | ICD-10-CM | POA: Diagnosis not present

## 2021-11-29 DIAGNOSIS — J9611 Chronic respiratory failure with hypoxia: Secondary | ICD-10-CM | POA: Diagnosis not present

## 2021-11-29 DIAGNOSIS — Z87891 Personal history of nicotine dependence: Secondary | ICD-10-CM | POA: Diagnosis not present

## 2021-12-11 ENCOUNTER — Other Ambulatory Visit: Payer: Self-pay | Admitting: Family Medicine

## 2021-12-13 DIAGNOSIS — I519 Heart disease, unspecified: Secondary | ICD-10-CM | POA: Diagnosis not present

## 2021-12-13 DIAGNOSIS — I2510 Atherosclerotic heart disease of native coronary artery without angina pectoris: Secondary | ICD-10-CM | POA: Diagnosis not present

## 2021-12-13 DIAGNOSIS — I469 Cardiac arrest, cause unspecified: Secondary | ICD-10-CM | POA: Diagnosis not present

## 2021-12-13 DIAGNOSIS — I10 Essential (primary) hypertension: Secondary | ICD-10-CM | POA: Diagnosis not present

## 2021-12-14 DIAGNOSIS — J441 Chronic obstructive pulmonary disease with (acute) exacerbation: Secondary | ICD-10-CM | POA: Diagnosis not present

## 2021-12-14 DIAGNOSIS — J449 Chronic obstructive pulmonary disease, unspecified: Secondary | ICD-10-CM | POA: Diagnosis not present

## 2021-12-14 DIAGNOSIS — J432 Centrilobular emphysema: Secondary | ICD-10-CM | POA: Diagnosis not present

## 2021-12-22 ENCOUNTER — Telehealth: Payer: Self-pay

## 2021-12-22 NOTE — Telephone Encounter (Signed)
Patient did not want to see another provider besides Dr Madilyn Fireman, is this okay to use an Acute slot for Friday for this?

## 2021-12-22 NOTE — Telephone Encounter (Signed)
See if he can come in around 740 tomorrow morning

## 2021-12-22 NOTE — Telephone Encounter (Signed)
Pt is requesting  an appointment for  sinus congestion and sob  with dr. Madilyn Fireman

## 2021-12-23 ENCOUNTER — Encounter: Payer: Self-pay | Admitting: Family Medicine

## 2021-12-23 ENCOUNTER — Ambulatory Visit (INDEPENDENT_AMBULATORY_CARE_PROVIDER_SITE_OTHER): Payer: Medicare Other | Admitting: Family Medicine

## 2021-12-23 VITALS — BP 160/83 | HR 55 | Temp 97.8°F | Resp 18 | Wt 175.0 lb

## 2021-12-23 DIAGNOSIS — J449 Chronic obstructive pulmonary disease, unspecified: Secondary | ICD-10-CM | POA: Diagnosis not present

## 2021-12-23 DIAGNOSIS — J441 Chronic obstructive pulmonary disease with (acute) exacerbation: Secondary | ICD-10-CM

## 2021-12-23 LAB — HEPATIC FUNCTION PANEL
ALT: 24 U/L (ref 10–40)
AST: 23 (ref 14–40)
Alkaline Phosphatase: 85 (ref 25–125)
Bilirubin, Total: 0.4

## 2021-12-23 LAB — BASIC METABOLIC PANEL
BUN: 16 (ref 4–21)
CO2: 30 — AB (ref 13–22)
Chloride: 104 (ref 99–108)
Creatinine: 1.1 (ref 0.6–1.3)
Glucose: 92
Potassium: 5 mEq/L (ref 3.5–5.1)
Sodium: 135 — AB (ref 137–147)

## 2021-12-23 LAB — CBC AND DIFFERENTIAL
HCT: 47 (ref 41–53)
Hemoglobin: 15.5 (ref 13.5–17.5)
Platelets: 319 10*3/uL (ref 150–400)
WBC: 7.1

## 2021-12-23 LAB — TSH: TSH: 0.7 (ref 0.41–5.90)

## 2021-12-23 LAB — HEMOGLOBIN A1C: Hemoglobin A1C: 6.6

## 2021-12-23 LAB — VITAMIN B12: Vitamin B-12: 372

## 2021-12-23 LAB — COMPREHENSIVE METABOLIC PANEL
Albumin: 3.7 (ref 3.5–5.0)
Calcium: 9 (ref 8.7–10.7)

## 2021-12-23 LAB — LIPID PANEL
Cholesterol: 95 (ref 0–200)
HDL: 50 (ref 35–70)
LDL Cholesterol: 32
Triglycerides: 66 (ref 40–160)

## 2021-12-23 LAB — PROTEIN, TOTAL: Total Protein: 7.9 (ref 6.4–8.2)

## 2021-12-23 LAB — VITAMIN D 25 HYDROXY (VIT D DEFICIENCY, FRACTURES): Vit D, 25-Hydroxy: 36.45

## 2021-12-23 MED ORDER — PREDNISONE 20 MG PO TABS: 40.0000 mg | ORAL_TABLET | Freq: Every day | ORAL | 0 refills | Status: DC

## 2021-12-23 MED ORDER — AMOXICILLIN-POT CLAVULANATE 875-125 MG PO TABS: 1.0000 | ORAL_TABLET | Freq: Two times a day (BID) | ORAL | 0 refills | Status: DC

## 2021-12-23 NOTE — Assessment & Plan Note (Addendum)
Unfortunately have a COPD exacerbation today.  He is using his inhalers regularly.  He does plan to get back in with Dr. Geraldo Pitter as well as the [place].  He just feels like gradually he is continuing to lose for lung function.

## 2021-12-23 NOTE — Progress Notes (Signed)
Acute Office Visit  Subjective:     Patient ID: Zachary Waters, male    DOB: 12/15/1940, 80 y.o.   MRN: [id number]  Chief Complaint  Patient presents with   Cough    HPI Patient is in today for Cough, inc sputum reduction and SOB x 2.5 weeks. No fever or chills.    ROS      Objective:    BP (!) 160/83   Pulse (!) 55   Temp 97.8 F (36.6 C) (Oral)   Resp 18   Wt 175 lb (79.4 kg)   SpO2 94%   BMI 25.84 kg/m    Physical Exam Constitutional:      Appearance: He is well-developed.  HENT:     Head: Normocephalic and atraumatic.     Right Ear: External ear normal.     Left Ear: External ear normal.     Ears:     Comments: TMs blocked by cerumen bilat    Nose: Nose normal.  Eyes:     Conjunctiva/sclera: Conjunctivae normal.     Pupils: Pupils are equal, round, and reactive to light.  Neck:     Thyroid: No thyromegaly.  Cardiovascular:     Rate and Rhythm: Normal rate and regular rhythm.     Heart sounds: Normal heart sounds.  Pulmonary:     Effort: Pulmonary effort is normal.     Breath sounds: Wheezing present.     Comments: Bilateral expiratory wheeze at the bases. Musculoskeletal:     Cervical back: Neck supple.  Lymphadenopathy:     Cervical: No cervical adenopathy.  Skin:    General: Skin is warm and dry.  Neurological:     Mental Status: He is alert and oriented to person, place, and time.  Psychiatric:        Behavior: Behavior normal.     No results found for any visits on 12/23/21.      Assessment & Plan:   Problem List Items Addressed This Visit       Respiratory   COPD, group D, by GOLD 2017 classification ([place])    Unfortunately have a COPD exacerbation today.  He is using his inhalers regularly.  He does plan to get back in with Dr. Geraldo Pitter as well as the [place].  He just feels like gradually he is continuing to lose for lung function.      Relevant Medications   predniSONE (DELTASONE) 20 MG tablet   Other Visit Diagnoses      COPD exacerbation ([place])    -  Primary   Relevant Medications   predniSONE (DELTASONE) 20 MG tablet      ED exacerbation-we will go ahead and treat with Augmentin and prednisone.  It sounds like he has been in a flare for a little over 2 weeks at this point and is not continuing to improve he is coughing up large amounts of sputum throughout the day and even in the middle the night.  Call if not improving over the next 5 days.  Meds ordered this encounter  Medications   amoxicillin-clavulanate (AUGMENTIN) 875-125 MG tablet    Sig: Take 1 tablet by mouth 2 (two) times daily.    Dispense:  14 tablet    Refill:  0   predniSONE (DELTASONE) 20 MG tablet    Sig: Take 2 tablets (40 mg total) by mouth daily with breakfast.    Dispense:  10 tablet    Refill:  0    No follow-ups  on file.  Beatrice Lecher, MD

## 2021-12-29 ENCOUNTER — Encounter: Payer: Self-pay | Admitting: General Practice

## 2022-01-06 ENCOUNTER — Ambulatory Visit (INDEPENDENT_AMBULATORY_CARE_PROVIDER_SITE_OTHER): Payer: Medicare Other | Admitting: Family Medicine

## 2022-01-06 VITALS — BP 148/86 | HR 56 | Ht 69.0 in | Wt 176.0 lb

## 2022-01-06 DIAGNOSIS — I10 Essential (primary) hypertension: Secondary | ICD-10-CM

## 2022-01-06 NOTE — Progress Notes (Signed)
   Subjective:    Patient ID: Zachary Waters, male    DOB: [birth year], 81 y.o.   MRN: [id number]  HPI Patient presents today for a blood pressure check after having an elevated reading during an office visit 2 weeks ago. Patient brought readings from home from the past week: 8/22 135/72 59 8/23 125/69 59 8/24 136/67 57 8/25 132/74 55 8/31 124/68 60   Review of Systems     Objective:   Physical Exam        Assessment & Plan:  Patient's initial blood pressure reading was elevated. Patient was allowed to sit for several minutes and the blood pressure was repeated manually. Per Dr. Madilyn Fireman, patient to continue to monitor blood pressure at home and to bring his home cuff with him to his next appt to compare with our machine. Patient aware of this recommendation and verbalized understanding.

## 2022-01-06 NOTE — Progress Notes (Signed)
Repeat blood pressures here were still elevated but home blood pressures look well controlled.  It is improved from his last visit about 2 weeks ago.  Encouraged him to bring in his home blood pressure cuff at next office visit this fall.  Consistent with his medications.

## 2022-01-14 DIAGNOSIS — J449 Chronic obstructive pulmonary disease, unspecified: Secondary | ICD-10-CM | POA: Diagnosis not present

## 2022-01-14 DIAGNOSIS — J441 Chronic obstructive pulmonary disease with (acute) exacerbation: Secondary | ICD-10-CM | POA: Diagnosis not present

## 2022-01-14 DIAGNOSIS — J432 Centrilobular emphysema: Secondary | ICD-10-CM | POA: Diagnosis not present

## 2022-02-13 DIAGNOSIS — J432 Centrilobular emphysema: Secondary | ICD-10-CM | POA: Diagnosis not present

## 2022-02-13 DIAGNOSIS — J449 Chronic obstructive pulmonary disease, unspecified: Secondary | ICD-10-CM | POA: Diagnosis not present

## 2022-02-13 DIAGNOSIS — J441 Chronic obstructive pulmonary disease with (acute) exacerbation: Secondary | ICD-10-CM | POA: Diagnosis not present

## 2022-02-28 DIAGNOSIS — I472 Ventricular tachycardia, unspecified: Secondary | ICD-10-CM | POA: Diagnosis not present

## 2022-03-01 DIAGNOSIS — R918 Other nonspecific abnormal finding of lung field: Secondary | ICD-10-CM | POA: Diagnosis not present

## 2022-03-01 DIAGNOSIS — J9611 Chronic respiratory failure with hypoxia: Secondary | ICD-10-CM | POA: Diagnosis not present

## 2022-03-01 DIAGNOSIS — Z23 Encounter for immunization: Secondary | ICD-10-CM | POA: Diagnosis not present

## 2022-03-01 DIAGNOSIS — J449 Chronic obstructive pulmonary disease, unspecified: Secondary | ICD-10-CM | POA: Diagnosis not present

## 2022-03-10 ENCOUNTER — Ambulatory Visit (INDEPENDENT_AMBULATORY_CARE_PROVIDER_SITE_OTHER): Payer: Medicare Other | Admitting: Family Medicine

## 2022-03-10 ENCOUNTER — Encounter: Payer: Self-pay | Admitting: Family Medicine

## 2022-03-10 VITALS — BP 137/64 | HR 56 | Ht 69.0 in | Wt 177.1 lb

## 2022-03-10 DIAGNOSIS — R7301 Impaired fasting glucose: Secondary | ICD-10-CM

## 2022-03-10 DIAGNOSIS — J441 Chronic obstructive pulmonary disease with (acute) exacerbation: Secondary | ICD-10-CM

## 2022-03-10 DIAGNOSIS — E871 Hypo-osmolality and hyponatremia: Secondary | ICD-10-CM | POA: Diagnosis not present

## 2022-03-10 DIAGNOSIS — I10 Essential (primary) hypertension: Secondary | ICD-10-CM | POA: Diagnosis not present

## 2022-03-10 DIAGNOSIS — J449 Chronic obstructive pulmonary disease, unspecified: Secondary | ICD-10-CM | POA: Diagnosis not present

## 2022-03-10 DIAGNOSIS — Z23 Encounter for immunization: Secondary | ICD-10-CM | POA: Diagnosis not present

## 2022-03-10 MED ORDER — AMOXICILLIN-POT CLAVULANATE 875-125 MG PO TABS: 1.0000 | ORAL_TABLET | Freq: Two times a day (BID) | ORAL | 0 refills | Status: DC

## 2022-03-10 MED ORDER — BUDESONIDE 1 MG/2ML IN SUSP: 1.0000 mg | Freq: Two times a day (BID) | RESPIRATORY_TRACT | 0 refills | Status: DC

## 2022-03-10 NOTE — Addendum Note (Signed)
Addended by: Beatrice Lecher D on: 03/10/2022 12:49 PM   Modules accepted: Orders

## 2022-03-10 NOTE — Progress Notes (Addendum)
Established Patient Office Visit  Subjective   Patient ID: Zachary Waters, male    DOB: 1940-10-29  Age: 81 y.o. MRN: [id number]  Chief Complaint  Patient presents with   COPD    HPI  Feeling bad with increased shortness of breath and cough 3 days ago in the evening.  No fevers or chills.  He did a COVID test at that was negative.  He also recently saw pulmonology and they adjusted some of his medications.  They actually took him off of [place] and have switched him to L inhaled medication such as Brovana, Pulmicort and Yupevi.  Also started on azithromycin every other day.  Noticed that he is having more frequent flares now every 3 to 4 months instead of twice a year.    ROS    Objective:     BP 137/64   Pulse (!) 56   Ht '5\' 9"'$  (1.753 m)   Wt 177 lb 1.9 oz (80.3 kg)   SpO2 94%   BMI 26.16 kg/m    Physical Exam Constitutional:      Appearance: He is well-developed.  HENT:     Head: Normocephalic and atraumatic.     Right Ear: External ear normal.     Left Ear: External ear normal.     Ears:     Comments: Both canals blocked by cerumen    Nose: Nose normal.  Eyes:     Conjunctiva/sclera: Conjunctivae normal.     Pupils: Pupils are equal, round, and reactive to light.  Neck:     Thyroid: No thyromegaly.  Cardiovascular:     Rate and Rhythm: Normal rate.     Heart sounds: Normal heart sounds.  Pulmonary:     Effort: Pulmonary effort is normal.     Breath sounds: Normal breath sounds.  Musculoskeletal:     Cervical back: Neck supple.  Lymphadenopathy:     Cervical: No cervical adenopathy.  Skin:    General: Skin is warm and dry.  Neurological:     Mental Status: He is alert and oriented to person, place, and time.      No results found for any visits on 03/10/22.    The ASCVD Risk score (Arnett DK, et al., 2019) failed to calculate for the following reasons:   The patient has a prior MI or stroke diagnosis    Assessment & Plan:    Problem List Items Addressed This Visit       Cardiovascular and Mediastinum   Essential hypertension   Relevant Orders   Hemoglobin T1X   BASIC METABOLIC PANEL WITH GFR     Respiratory   COPD, group D, by GOLD 2017 classification ([place]) - Primary   Relevant Medications   budesonide (PULMICORT) 1 MG/2ML nebulizer solution     Endocrine   IFG (impaired fasting glucose)   Relevant Orders   Hemoglobin B2I   BASIC METABOLIC PANEL WITH GFR   Other Visit Diagnoses     COPD exacerbation ([place])       Relevant Medications   budesonide (PULMICORT) 1 MG/2ML nebulizer solution   Encounter for immunization       Relevant Orders   Pfizer Fall 2023 Covid-19 Vaccine 20yr and older (Completed)   Hyponatremia       Relevant Orders   Hemoglobin AO0B  BASIC METABOLIC PANEL WITH GFR      COPD exacerbation-we will treat with Augmentin and prednisone.  Call if not better in 1 week.  I am to have him go ahead and hold the azithromycin while he is on the Augmentin and can restart every other day.  He still waiting on the Pulmicort to come through his mail order so I did go ahead and send over a 30-day supply to give him time to reach back out to the [place].  The other medications can be quite expensive so he will need to reach back out to them.  For now he will continue with the [facility] which she is using, and the albuterol.  He is also coming in for routine follow-up next week and would like to get his blood work done ahead of time.  We will go ahead and get those labs ordered today.  Return if symptoms worsen or fail to improve.    Beatrice Lecher, MD

## 2022-03-11 ENCOUNTER — Encounter: Payer: Self-pay | Admitting: Family Medicine

## 2022-03-11 MED ORDER — PREDNISONE 20 MG PO TABS: 40.0000 mg | ORAL_TABLET | Freq: Every day | ORAL | 0 refills | Status: DC

## 2022-03-11 NOTE — Telephone Encounter (Signed)
Meds ordered this encounter  Medications   predniSONE (DELTASONE) 20 MG tablet    Sig: Take 2 tablets (40 mg total) by mouth daily with breakfast.    Dispense:  10 tablet    Refill:  0    

## 2022-03-16 DIAGNOSIS — J449 Chronic obstructive pulmonary disease, unspecified: Secondary | ICD-10-CM | POA: Diagnosis not present

## 2022-03-16 DIAGNOSIS — J432 Centrilobular emphysema: Secondary | ICD-10-CM | POA: Diagnosis not present

## 2022-03-16 DIAGNOSIS — J441 Chronic obstructive pulmonary disease with (acute) exacerbation: Secondary | ICD-10-CM | POA: Diagnosis not present

## 2022-03-17 ENCOUNTER — Telehealth: Payer: Self-pay | Admitting: *Deleted

## 2022-03-17 ENCOUNTER — Encounter: Payer: Self-pay | Admitting: Family Medicine

## 2022-03-17 ENCOUNTER — Ambulatory Visit (INDEPENDENT_AMBULATORY_CARE_PROVIDER_SITE_OTHER): Payer: Medicare Other | Admitting: Family Medicine

## 2022-03-17 VITALS — BP 136/66 | HR 81 | Ht 69.0 in | Wt 178.0 lb

## 2022-03-17 DIAGNOSIS — R7301 Impaired fasting glucose: Secondary | ICD-10-CM | POA: Diagnosis not present

## 2022-03-17 DIAGNOSIS — I10 Essential (primary) hypertension: Secondary | ICD-10-CM | POA: Diagnosis not present

## 2022-03-17 DIAGNOSIS — Z0000 Encounter for general adult medical examination without abnormal findings: Secondary | ICD-10-CM | POA: Diagnosis not present

## 2022-03-17 DIAGNOSIS — E871 Hypo-osmolality and hyponatremia: Secondary | ICD-10-CM | POA: Diagnosis not present

## 2022-03-17 DIAGNOSIS — Z23 Encounter for immunization: Secondary | ICD-10-CM

## 2022-03-17 MED ORDER — PREDNISONE 20 MG PO TABS: 20.0000 mg | ORAL_TABLET | Freq: Every day | ORAL | 0 refills | Status: DC

## 2022-03-17 NOTE — Telephone Encounter (Signed)
Pt called and stated that Dr. Madilyn Fireman was going to call in another prescription for Prednisone for him.   Pt advised that this was sent this morning. He said that the pharmacy hadn't called him to let him know that it has been filled.

## 2022-03-17 NOTE — Progress Notes (Unsigned)
Complete physical exam  Patient: Zachary Waters   DOB: 04-18-1941   81 y.o. Male  MRN: [id number]  Subjective:    Chief Complaint  Patient presents with   Annual Exam         Zachary Waters is a 81 y.o. male who presents today for a complete physical exam. He reports consuming a general diet. {types:19826} He generally feels fairly well. .   Recently seen for COPD exacerbation he feels like overall he is somewhat better but still having a lot of cough.  He thinks he might need a little bit longer course on his prednisone.  He just finished up 5 days of 40 mg.   Most recent fall risk assessment:    03/10/2022   11:09 AM  [place] in the past year? 0  Number falls in past yr: 0  Injury with Fall? 0  Risk for fall due to : No Fall Risks  Follow up Falls evaluation completed     Most recent depression screenings:    03/10/2022   11:09 AM 12/23/2021    8:05 AM  PHQ 2/9 Scores  PHQ - 2 Score 0 0    {VISON DENTAL STD PSA (Optional):27386}  Past Medical History:  Diagnosis Date   Cervical spondylosis with radiculopathy 03/08/2012   Cervical spondylosis with radiculopathy    COPD (chronic obstructive pulmonary disease) (HCC)    Dyspnea    Essential hypertension    GERD (gastroesophageal reflux disease) once in a great while   Hyperlipidemia 02/02/2018   Pneumonia 10/2009   Pneumonia 11/24/2020   Thyroid disease    Past Surgical History:  Procedure Laterality Date   CARDIAC CATHETERIZATION  11/30/2020   COLONOSCOPY     HERNIA REPAIR     inguinal hernia repair on right and left   INGUINAL HERNIA REPAIR Bilateral 04/21/2016   Procedure: LAPAROSCOPIC LEFT INGUINAL HERNIA REPAIR AND RIGHT FEMORAL HERNIA REPAIR;  Surgeon: Michael Boston, MD;  Location: [place];  Service: General;  Laterality: Bilateral;   INSERTION OF MESH Bilateral 04/21/2016   Procedure: INSERTION OF MESH LEFT INGUINAL HERNIA  AND INSERTION OF MESH RIGHT FEMORAL HERNIA;  Surgeon:  Michael Boston, MD;  [facility];  Service: General;  Laterality: Bilateral;   LEG SURGERY Right    tumor removed as a child from femur?   ROTATOR CUFF REPAIR  2005   Left   TONSILLECTOMY     Family History  Problem Relation Age of Onset   Prostate cancer Father 83   Heart disease Mother 21       deceased age 75   Depression Brother        Commited suicide   Depression Brother    No Known Allergies    Patient Care Team: Hali Marry, MD as PCP - General (Family Medicine) Marcy Panning (Pulmonary Disease) Milas Kocher (Chiropractic Medicine) Michael Boston, MD as Consulting Physician (General Surgery) Darius Bump, [facility] as Pharmacist (Pharmacist) Darius Bump, [facility] (Pharmacist)   Outpatient Medications Prior to Visit  Medication Sig   albuterol (PROVENTIL) (5 MG/ML) 0.5% nebulizer solution Take 2.5 mg by nebulization every 6 (six) hours as needed.   albuterol (VENTOLIN HFA) 108 (90 Base) MCG/ACT inhaler Inhale 2 puffs into the lungs every 6 (six) hours as needed.   AMBULATORY NON FORMULARY MEDICATION Medication Name: Overnight pulse oximetry patient has a history of COPD.  Please fax to his DME supplier.   AMBULATORY NON FORMULARY  MEDICATION Medication Name: Oxygen and supplies, 2 liters/min.  Severe COPD.   Ascorbic Acid (VITAMIN C) 1000 MG tablet Take 2,000 mg by mouth daily.   aspirin EC 81 MG tablet Take 81 mg by mouth daily.   atorvastatin (LIPITOR) 80 MG tablet Take 80 mg by mouth daily.   budesonide (PULMICORT) 1 MG/2ML nebulizer solution Take 2 mLs (1 mg total) by nebulization in the morning and at bedtime.   carvedilol (COREG) 6.25 MG tablet Take 6.25 mg by mouth 2 (two) times daily.   fluticasone-salmeterol (ADVAIR) 100-50 MCG/ACT AEPB Inhale 2 puffs into the lungs 2 (two) times daily.   levothyroxine (SYNTHROID) 112 MCG tablet TAKE 1 TABLET BY MOUTH EVERY DAY BEFORE BREAKFAST   Omega-3 Fatty Acids (FISH OIL) 1000 MG CAPS Take 1 capsule by mouth daily.    QUERCETIN PO Take 1 tablet by mouth daily. Pt reports taking 1 tablet daily   tiotropium (SPIRIVA HANDIHALER) 18 MCG inhalation capsule Place 1 capsule (18 mcg total) into inhaler and inhale daily. (Patient taking differently: Place 18 mcg into inhaler and inhale 2 (two) times daily.)   [DISCONTINUED] amoxicillin-clavulanate (AUGMENTIN) 875-125 MG tablet Take 1 tablet by mouth 2 (two) times daily.   [DISCONTINUED] predniSONE (DELTASONE) 20 MG tablet Take 2 tablets (40 mg total) by mouth daily with breakfast.   No facility-administered medications prior to visit.    ROS        Objective:     BP 136/66   Pulse 81   Ht '5\' 9"'$  (1.753 m)   Wt 178 lb (80.7 kg)   SpO2 93%   BMI 26.29 kg/m     Physical Exam Constitutional:      Appearance: He is well-developed.  HENT:     Head: Normocephalic and atraumatic.  Cardiovascular:     Rate and Rhythm: Normal rate and regular rhythm.     Heart sounds: Normal heart sounds.  Pulmonary:     Effort: Pulmonary effort is normal.     Comments: Coarse BS Skin:    General: Skin is warm and dry.  Neurological:     Mental Status: He is alert and oriented to person, place, and time.  Psychiatric:        Behavior: Behavior normal.      No results found for any visits on 03/17/22. {Show previous labs (optional):23779}    Assessment & Plan:    Routine Health Maintenance and Physical Exam  Immunization History  Administered Date(s) Administered   COVID-19, mRNA, vaccine(Comirnaty)12 years and older 03/10/2022   Fluad Quad(high Dose 65+) 02/12/2020, 02/24/2021   Influenza Inj Mdck Quad Pf 03/01/2022   Influenza Split 02/06/2013   Influenza, High Dose Seasonal PF 01/24/2012, 02/04/2016, 02/02/2017, 02/06/2018, 02/05/2019   Influenza,inj,Quad PF,6+ Mos 02/12/2013, 01/09/2014, 01/13/2015, 02/07/2016   Moderna Covid-19 Vaccine Bivalent Booster 61yr & up 02/24/2021   Moderna Sars-Covid-2 Vaccination 05/30/2019, 06/11/2019, 07/02/2019,  03/26/2020, 08/24/2020   PNEUMOCOCCAL CONJUGATE-20 03/17/2022   Pneumococcal Conjugate-13 08/06/2013, 12/20/2013, 05/15/2019   Pneumococcal Polysaccharide-23 05/09/2010   Pneumococcal-Unspecified 06/09/2010   Tdap 01/03/2012, 12/23/2021   Zoster Recombinat (Shingrix) 02/09/2017, 04/13/2017   Zoster, Live 10/27/2010, 02/09/2017    Health Maintenance  Topic Date Due   Lung Cancer Screening  07/07/2019   COVID-19 Vaccine (7 - Moderna risk series) 04/21/2021   Medicare Annual Wellness (AWV)  12/11/2021   COLONOSCOPY (Pts 45-452yrInsurance coverage will need to be confirmed)  07/17/2022   TETANUS/TDAP  12/24/2031   Pneumonia Vaccine 6537Years old  Completed  INFLUENZA VACCINE  Completed   Hepatitis C Screening  Completed   Zoster Vaccines- Shingrix  Completed   HPV VACCINES  Aged Out    Discussed health benefits of physical activity, and encouraged him to engage in regular exercise appropriate for his age and condition.  Problem List Items Addressed This Visit   None Visit Diagnoses     Wellness examination    -  Primary   Need for pneumococcal 20-valent conjugate vaccination       Relevant Orders   Pneumococcal conjugate vaccine 20-valent (Prevnar 20) (Completed)      Keep up a regular exercise program and make sure you are eating a healthy diet Try to eat 4 servings of dairy a day, or if you are lactose intolerant take a calcium with vitamin D daily.  Your vaccines are up to date.   COPD exacerbation-we will add 5 more days of 20 mg tabs to try to taper him.  Hopefully this will help if not please let us know.  Return in about 6 months (around 09/15/2022) for IFG, COPD, etc .     Beatrice Lecher, MD

## 2022-03-18 LAB — BASIC METABOLIC PANEL WITH GFR
BUN: 20 mg/dL (ref 7–25)
CO2: 32 mmol/L (ref 20–32)
Calcium: 9.3 mg/dL (ref 8.6–10.3)
Chloride: 102 mmol/L (ref 98–110)
Creat: 1.04 mg/dL (ref 0.70–1.28)
Glucose, Bld: 96 mg/dL (ref 65–99)
Potassium: 4.9 mmol/L (ref 3.5–5.3)
Sodium: 141 mmol/L (ref 135–146)
eGFR: 73 mL/min/{1.73_m2} (ref >=60)

## 2022-03-18 LAB — HEMOGLOBIN A1C
Hgb A1c MFr Bld: 6.2 % of total Hgb — ABNORMAL HIGH (ref <5.7)
Mean Plasma Glucose: 131 mg/dL
eAG (mmol/L): 7.3 mmol/L

## 2022-03-18 NOTE — Progress Notes (Signed)
Lvm for the patient to call and schedule an appointment for follow up with [facility] for results from labs. Tvt

## 2022-03-18 NOTE — Progress Notes (Signed)
A1C looks much better than last time! Great work.  Metabolic panel is normal.  We would love to get schedule for the Medicare yearly Well Visit with our nurse [place].  It can be done over the phone and usually only takes 20 minutes.

## 2022-04-11 DIAGNOSIS — J449 Chronic obstructive pulmonary disease, unspecified: Secondary | ICD-10-CM | POA: Diagnosis not present

## 2022-04-11 DIAGNOSIS — J9611 Chronic respiratory failure with hypoxia: Secondary | ICD-10-CM | POA: Diagnosis not present

## 2022-04-11 DIAGNOSIS — R918 Other nonspecific abnormal finding of lung field: Secondary | ICD-10-CM | POA: Diagnosis not present

## 2022-04-13 ENCOUNTER — Ambulatory Visit (INDEPENDENT_AMBULATORY_CARE_PROVIDER_SITE_OTHER): Payer: Medicare Other | Admitting: Family Medicine

## 2022-04-13 DIAGNOSIS — Z0000 Encounter for general adult medical examination without abnormal findings: Secondary | ICD-10-CM | POA: Diagnosis not present

## 2022-04-13 NOTE — Progress Notes (Signed)
MEDICARE ANNUAL WELLNESS VISIT  04/13/2022  Telephone Visit Disclaimer This Medicare AWV was conducted by telephone due to national recommendations for restrictions regarding the COVID-19 Pandemic (e.g. social distancing).  I verified, using two identifiers, that I am speaking with Zachary Waters or their authorized healthcare agent. I discussed the limitations, risks, security, and privacy concerns of performing an evaluation and management service by telephone and the potential availability of an in-person appointment in the future. The patient expressed understanding and agreed to proceed.  Location of Patient: Home Location of Provider (nurse):  In the office.  Subjective:    Zachary Waters is a 81 y.o. male patient of Metheney, Rene Kocher, MD who had a Medicare Annual Wellness Visit today via telephone. Zachary Waters is Retired and lives with their spouse. Zachary Waters has 2 children. Zachary Waters reports that Zachary Waters is socially active and does interact with friends/family regularly. Zachary Waters is minimally physically active and enjoys golf and working out.  Patient Care Team: Hali Marry, MD as PCP - General (Family Medicine) Marcy Panning (Pulmonary Disease) Milas Kocher (Chiropractic Medicine) Michael Boston, MD as Consulting Physician (General Surgery) Darius Bump, [facility] as Pharmacist (Pharmacist) Darius Bump, [facility] (Pharmacist)     04/13/2022    9:05 AM 03/17/2022    9:16 AM 12/11/2020   10:26 AM 03/10/2020    9:21 AM 02/12/2019    9:22 AM 02/06/2018    8:37 AM 02/24/2017    9:59 AM  Advanced Directives  Does Patient Have a Medical Advance Directive? Yes Yes Yes Yes Yes Yes No  Type of Advance Directive Living will;Healthcare Power of [place];Living will Living will;Healthcare Power of Attorney Living will;Healthcare Power of [place];Living will Living will;Healthcare Power of Attorney   Does patient want to make changes to  medical advance directive?  No - Patient declined No - Patient declined  No - Patient declined    Copy of [place] in Chart? Yes - validated most recent copy scanned in chart (See row information) Yes - validated most recent copy scanned in chart (See row information) No - copy requested No - copy requested No - copy requested No - copy requested     Hospital Utilization Over the Past 12 Months: # of hospitalizations or ER visits: 0 # of surgeries: 0  Review of Systems    Patient reports that his overall health is unchanged compared to last year.  History obtained from chart review and the patient  Patient Reported Readings (BP, Pulse, CBG, Weight, etc) none  Pain Assessment Pain : No/denies pain     Current Medications & Allergies (verified) Allergies as of 04/13/2022   No Known Allergies      Medication List        Accurate as of April 13, 2022  9:16 AM. If you have any questions, ask your nurse or doctor.          albuterol 108 (90 Base) MCG/ACT inhaler Commonly known as: VENTOLIN HFA Inhale 2 puffs into the lungs every 6 (six) hours as needed.   albuterol (5 MG/ML) 0.5% nebulizer solution Commonly known as: PROVENTIL Take 2.5 mg by nebulization every 6 (six) hours as needed.   AMBULATORY NON FORMULARY MEDICATION Medication Name: Overnight pulse oximetry patient has a history of COPD.  Please fax to his DME supplier.   AMBULATORY NON FORMULARY MEDICATION Medication Name: Oxygen and supplies, 2 liters/min.  Severe COPD.   aspirin  EC 81 MG tablet Take 81 mg by mouth daily.   atorvastatin 80 MG tablet Commonly known as: LIPITOR Take 80 mg by mouth daily.   budesonide 1 MG/2ML nebulizer solution Commonly known as: Pulmicort Take 2 mLs (1 mg total) by nebulization in the morning and at bedtime.   carvedilol 6.25 MG tablet Commonly known as: COREG Take 6.25 mg by mouth 2 (two) times daily.   Fish Oil 1000 MG Caps Take 1 capsule by  mouth daily.   fluticasone-salmeterol 100-50 MCG/ACT Aepb Commonly known as: ADVAIR Inhale 2 puffs into the lungs 2 (two) times daily.   levothyroxine 112 MCG tablet Commonly known as: SYNTHROID TAKE 1 TABLET BY MOUTH EVERY DAY BEFORE BREAKFAST   predniSONE 20 MG tablet Commonly known as: DELTASONE Take 1 tablet (20 mg total) by mouth daily with breakfast.   QUERCETIN PO Take 1 tablet by mouth daily. Pt reports taking 1 tablet daily   tiotropium 18 MCG inhalation capsule Commonly known as: Spiriva HandiHaler Place 1 capsule (18 mcg total) into inhaler and inhale daily. What changed: when to take this   vitamin C 1000 MG tablet Take 2,000 mg by mouth daily.        History (reviewed): Past Medical History:  Diagnosis Date   Cervical spondylosis with radiculopathy 03/08/2012   Cervical spondylosis with radiculopathy    COPD (chronic obstructive pulmonary disease) (HCC)    COPD, group D, by GOLD 2017 classification ([place]) 10/20/2010   Dyspnea    Essential hypertension    GERD (gastroesophageal reflux disease) once in a great while   Heart murmur I have a defibulator   Hyperlipidemia 02/02/2018   Pneumonia 10/2009   Pneumonia 11/24/2020   Thyroid disease    Past Surgical History:  Procedure Laterality Date   CARDIAC CATHETERIZATION  11/30/2020   COLONOSCOPY     HERNIA REPAIR     inguinal hernia repair on right and left   INGUINAL HERNIA REPAIR Bilateral 04/21/2016   Procedure: LAPAROSCOPIC LEFT INGUINAL HERNIA REPAIR AND RIGHT FEMORAL HERNIA REPAIR;  Surgeon: Michael Boston, MD;  Location: [place];  Service: General;  Laterality: Bilateral;   INSERTION OF MESH Bilateral 04/21/2016   Procedure: INSERTION OF MESH LEFT INGUINAL HERNIA  AND INSERTION OF MESH RIGHT FEMORAL HERNIA;  Surgeon: Michael Boston, MD;  Location: [place];  Service: General;  Laterality: Bilateral;   LEG SURGERY Right    tumor removed as a child from femur?   ROTATOR CUFF REPAIR  2005   Left    TONSILLECTOMY     Family History  Problem Relation Age of Onset   Prostate cancer Father 19   Heart disease Mother 32       deceased age 19   Depression Brother        Commited suicide   Depression Brother    Social History   Socioeconomic History   Marital status: Married    Spouse name: [name]   Number of children: 2   Years of education: 14   Highest education level: Associate degree: academic program  Occupational History   Occupation: retired    Comment: Producer, television/film/video  Tobacco Use   Smoking status: Former    Types: Pipe    Quit date: 10/07/2009    Years since quitting: 12.5   Smokeless tobacco: Never  Vaping Use   Vaping Use: Never used  Substance and Sexual Activity   Alcohol use: Yes    Alcohol/week: 2.0 standard drinks of alcohol  Types: 1 Glasses of wine, 1 Cans of beer per week    Comment: occasional   Drug use: No   Sexual activity: Never  Other Topics Concern   Not on file  Social History Narrative   Lives with his wife. Zachary Waters has two children. Zachary Waters is recovering from pneumonia and a recent cardiac cath. And is recovering and is not able to do much exercise or play golf.    Social Determinants of Health   Financial Resource Strain: Low Risk  (04/12/2022)   Overall Financial Resource Strain (CARDIA)    Difficulty of Paying Living Expenses: Not very hard  Food Insecurity: No Food Insecurity (04/12/2022)   Hunger Vital Sign    Worried About Running Out of Food in the Last Year: Never true    Ran Out of Food in the Last Year: Never true  Transportation Needs: No Transportation Needs (04/12/2022)   PRAPARE - Hydrologist (Medical): No    Lack of Transportation (Non-Medical): No  Physical Activity: Inactive (04/12/2022)   Exercise Vital Sign    Days of Exercise per Week: 0 days    Minutes of Exercise per Session: 0 min  Stress: No Stress Concern Present (04/12/2022)   [place]    Feeling of Stress : Only a little  Social Connections: Socially Integrated (04/13/2022)   Social Connection and Isolation Panel [NHANES]    Frequency of Communication with Friends and Family: Twice a week    Frequency of Social Gatherings with Friends and Family: Once a week    Attends Religious Services: More than 4 times per year    Active Member of [employer] or Organizations: Yes    Attends Archivist Meetings: More than 4 times per year    Marital Status: Married    Activities of Daily Living    04/13/2022    9:07 AM 04/12/2022   10:26 AM  In your present state of health, do you have any difficulty performing the following activities:  Hearing?  1  Comment bilateral hearing aids.   Vision?  0  Difficulty concentrating or making decisions?  0  Walking or climbing stairs?  1  Dressing or bathing?  0  Doing errands, shopping?  0  Preparing Food and eating ?  N  Using the Toilet?  N  In the past six months, have you accidently leaked urine?  N  Do you have problems with loss of bowel control?  N  Managing your Medications?  N  Managing your Finances?  N  Housekeeping or managing your Housekeeping?  N    Patient Education/ Literacy How often do you need to have someone help you when you read instructions, pamphlets, or other written materials from your doctor or pharmacy?: 1 - Never What is the last grade level you completed in school?: Associates degree  Exercise Current Exercise Habits: The patient does not participate in regular exercise at present, Exercise limited by: respiratory conditions(s)  Diet Patient reports consuming 2 meals a day and 1 snack(s) a day Patient reports that his primary diet is: Regular Patient reports that she does have regular access to food.   Depression Screen    04/13/2022    9:09 AM 03/10/2022   11:09 AM 12/23/2021    8:05 AM 10/05/2021   10:40 AM 12/11/2020   10:23 AM 03/10/2020    9:10 AM 02/12/2019    9:25 AM  PHQ  2/9 Scores  PHQ - 2 Score 0 0 0 0 0 0 0     Fall Risk    04/13/2022    9:10 AM 04/12/2022   10:26 AM 03/10/2022   11:09 AM 12/23/2021    8:05 AM 10/05/2021   10:40 AM  Fall Risk   Falls in the past year? 0 0 0 0 0  Number falls in past yr: 0 0 0 0 0  Injury with Fall? 0 0 0 0 0  Risk for fall due to : No Fall Risks  No Fall Risks No Fall Risks No Fall Risks  Follow up Falls evaluation completed  Falls evaluation completed Falls evaluation completed Falls prevention discussed;Falls evaluation completed     Objective:  Mouhamed Glassco seemed alert and oriented and Zachary Waters participated appropriately during our telephone visit.  Blood Pressure Weight BMI  BP Readings from Last 3 Encounters:  03/17/22 136/66  03/10/22 137/64  01/06/22 (!) 148/86   Wt Readings from Last 3 Encounters:  03/17/22 178 lb (80.7 kg)  03/10/22 177 lb 1.9 oz (80.3 kg)  01/06/22 176 lb (79.8 kg)   BMI Readings from Last 1 Encounters:  03/17/22 26.29 kg/m    *Unable to obtain current vital signs, weight, and BMI due to telephone visit type  Hearing/Vision  Marcello Moores did not seem to have difficulty with hearing/understanding during the telephone conversation Reports that Zachary Waters has had a formal eye exam by an eye care professional within the past year Reports that Zachary Waters has not had a formal hearing evaluation within the past year *Unable to fully assess hearing and vision during telephone visit type  Cognitive Function:    04/13/2022    9:10 AM 03/17/2022    9:15 AM 12/11/2020   10:30 AM 02/12/2019    9:28 AM 02/06/2018    8:32 AM  6CIT Screen  What Year? 0 points 0 points 0 points 0 points 0 points  What month? 0 points 0 points 0 points 0 points 0 points  What time? 0 points 0 points 0 points 0 points 0 points  Count back from 20 0 points 0 points 0 points 0 points 0 points  Months in reverse 0 points 0 points 0 points 0 points 0 points  Repeat phrase 0 points 0 points 0 points 0 points 0 points  Total Score 0  points 0 points 0 points 0 points 0 points   (Normal:0-7, Significant for Dysfunction: >8)  Normal Cognitive Function Screening: Yes   Immunization & Health Maintenance Record Immunization History  Administered Date(s) Administered   COVID-19, mRNA, vaccine(Comirnaty)12 years and older 03/10/2022   Fluad Quad(high Dose 65+) 02/12/2020, 02/24/2021   Influenza Inj Mdck Quad Pf 03/01/2022   Influenza Split 02/06/2013   Influenza, High Dose Seasonal PF 01/24/2012, 02/04/2016, 02/02/2017, 02/06/2018, 02/05/2019   Influenza,inj,Quad PF,6+ Mos 02/12/2013, 01/09/2014, 01/13/2015, 02/07/2016   Moderna Covid-19 Vaccine Bivalent Booster 34yr & up 02/24/2021   Moderna Sars-Covid-2 Vaccination 05/30/2019, 06/11/2019, 07/02/2019, 03/26/2020, 08/24/2020   PNEUMOCOCCAL CONJUGATE-20 03/17/2022   Pneumococcal Conjugate-13 08/06/2013, 12/20/2013, 05/15/2019   Pneumococcal Polysaccharide-23 05/09/2010   Pneumococcal-Unspecified 06/09/2010   Tdap 01/03/2012, 12/23/2021   Zoster Recombinat (Shingrix) 02/09/2017, 04/13/2017   Zoster, Live 10/27/2010, 02/09/2017    Health Maintenance  Topic Date Due   Lung Cancer Screening  04/14/2023 (Originally 07/07/2019)   COVID-19 Vaccine (8 - 2023-24 season) 05/05/2022   COLONOSCOPY (Pts 45-467yrInsurance coverage will need to be confirmed)  07/17/2022   Medicare Annual Wellness (AWV)  04/14/2023   DTaP/Tdap/Td (3 - Td or Tdap) 12/24/2031   Pneumonia Vaccine 71+ Years old  Completed   INFLUENZA VACCINE  Completed   Hepatitis C Screening  Completed   Zoster Vaccines- Shingrix  Completed   HPV VACCINES  Aged Out       Assessment  This is a routine wellness examination for [employer].  Health Maintenance: Due or Overdue There are no preventive care reminders to display for this patient.   Caren Macadam Gaza does not need a referral for [employer] Assistance: Care Management:   no Social Work:    no Prescription  Assistance:  no Nutrition/Diabetes Education:  no   Plan:  Personalized Goals  Goals Addressed               This Visit's Progress     Patient Stated (pt-stated)        Patient stated that Zachary Waters would like to start pulmonary rehab and hopes to start soon.       Personalized Health Maintenance & Screening Recommendations  There are no preventive care reminders to display for this patient.  Lung Cancer Screening Recommended: yes; declined at this time. (Low Dose CT Chest recommended if Age 81-80 years, 20 pack-year currently smoking OR have quit w/in past 15 years) Hepatitis C Screening recommended: no HIV Screening recommended: no  Advanced Directives: Written information was not prepared per patient's request.  Referrals & Orders No orders of the defined types were placed in this encounter.   Follow-up Plan Follow-up with Hali Marry, MD as planned Discuss lung cancer screening with PCP. Medicare wellness visit in one year. Patient will access AVS on my chart.   I have personally reviewed and noted the following in the patient's chart:   Medical and social history Use of alcohol, tobacco or illicit drugs  Current medications and supplements Functional ability and status Nutritional status Physical activity Advanced directives List of other physicians Hospitalizations, surgeries, and ER visits in previous 12 months Vitals Screenings to include cognitive, depression, and falls Referrals and appointments  In addition, I have reviewed and discussed with Zachary Waters certain preventive protocols, quality metrics, and best practice recommendations. A written personalized care plan for preventive services as well as general preventive health recommendations is available and can be mailed to the patient at his request.      Tinnie Gens, RN BSN  04/13/2022

## 2022-04-13 NOTE — Patient Instructions (Addendum)
[place] Maintenance Summary and Written Plan of Care  [name] ,  Thank you for allowing me to perform your Medicare Annual Wellness Visit and for your ongoing commitment to your health.   Health Maintenance & Immunization History Health Maintenance  Topic Date Due   Lung Cancer Screening  04/14/2023 (Originally 07/07/2019)   COVID-19 Vaccine (8 - 2023-24 season) 05/05/2022   COLONOSCOPY (Pts 45-28yr Insurance coverage will need to be confirmed)  07/17/2022   Medicare Annual Wellness (AWV)  04/14/2023   DTaP/Tdap/Td (3 - Td or Tdap) 12/24/2031   Pneumonia Vaccine 79 Years old  Completed   INFLUENZA VACCINE  Completed   Hepatitis C Screening  Completed   Zoster Vaccines- Shingrix  Completed   HPV VACCINES  Aged Out   Immunization History  Administered Date(s) Administered   COVID-19, mRNA, vaccine(Comirnaty)12 years and older 03/10/2022   Fluad Quad(high Dose 79+) 02/12/2020, 02/24/2021   Influenza Inj Mdck Quad Pf 03/01/2022   Influenza Split 02/06/2013   Influenza, High Dose Seasonal PF 01/24/2012, 02/04/2016, 02/02/2017, 02/06/2018, 02/05/2019   Influenza,inj,Quad PF,6+ Mos 02/12/2013, 01/09/2014, 01/13/2015, 02/07/2016   Moderna Covid-19 Vaccine Bivalent Booster 132yr& up 02/24/2021   Moderna Sars-Covid-2 Vaccination 05/30/2019, 06/11/2019, 07/02/2019, 03/26/2020, 08/24/2020   PNEUMOCOCCAL CONJUGATE-20 03/17/2022   Pneumococcal Conjugate-13 08/06/2013, 12/20/2013, 05/15/2019   Pneumococcal Polysaccharide-23 05/09/2010   Pneumococcal-Unspecified 06/09/2010   Tdap 01/03/2012, 12/23/2021   Zoster Recombinat (Shingrix) 02/09/2017, 04/13/2017   Zoster, Live 10/27/2010, 02/09/2017    These are the patient goals that we discussed:  Goals Addressed               This Visit's Progress     Patient Stated (pt-stated)        Patient stated that he would like to start pulmonary rehab and hopes to start soon.         This is a list of  Health Maintenance Items that are overdue or due now: There are no preventive care reminders to display for this patient.    Orders/Referrals Placed Today: No orders of the defined types were placed in this encounter.  (Contact our referral department at [phone] you have not spoken with someone about your referral appointment within the next 5 days)    Follow-up Plan Follow-up with MeHali MarryMD as planned Discuss lung cancer screening with PCP. Medicare wellness visit in one year. Patient will access AVS on my chart.      Health Maintenance, Male Adopting a healthy lifestyle and getting preventive care are important in promoting health and wellness. Ask your health care provider about: The right schedule for you to have regular tests and exams. Things you can do on your own to prevent diseases and keep yourself healthy. What should I know about diet, weight, and exercise? Eat a healthy diet  Eat a diet that includes plenty of vegetables, fruits, low-fat dairy products, and lean protein. Do not eat a lot of foods that are high in solid fats, added sugars, or sodium. Maintain a healthy weight Body mass index (BMI) is a measurement that can be used to identify possible weight problems. It estimates body fat based on height and weight. Your health care provider can help determine your BMI and help you achieve or maintain a healthy weight. Get regular exercise Get regular exercise. This is one of the most important things you can do for your health. Most adults should: Exercise for at least 150 minutes each week. The exercise  should increase your heart rate and make you sweat (moderate-intensity exercise). Do strengthening exercises at least twice a week. This is in addition to the moderate-intensity exercise. Spend less time sitting. Even light physical activity can be beneficial. Watch cholesterol and blood lipids Have your blood tested for lipids and cholesterol  at 79 years of age, then have this test every 5 years. You may need to have your cholesterol levels checked more often if: Your lipid or cholesterol levels are high. You are older than 79 years of age. You are at high risk for heart disease. What should I know about cancer screening? Many types of cancers can be detected early and may often be prevented. Depending on your health history and family history, you may need to have cancer screening at various ages. This may include screening for: Colorectal cancer. Prostate cancer. Skin cancer. Lung cancer. What should I know about heart disease, diabetes, and high blood pressure? Blood pressure and heart disease High blood pressure causes heart disease and increases the risk of stroke. This is more likely to develop in people who have high blood pressure readings or are overweight. Talk with your health care provider about your target blood pressure readings. Have your blood pressure checked: Every 3-5 years if you are 32-71 years of age. Every year if you are 48 years old or older. If you are between the ages of 1 and 30 and are a current or former smoker, ask your health care provider if you should have a one-time screening for abdominal aortic aneurysm (AAA). Diabetes Have regular diabetes screenings. This checks your fasting blood sugar level. Have the screening done: Once every three years after age 21 if you are at a normal weight and have a low risk for diabetes. More often and at a younger age if you are overweight or have a high risk for diabetes. What should I know about preventing infection? Hepatitis B If you have a higher risk for hepatitis B, you should be screened for this virus. Talk with your health care provider to find out if you are at risk for hepatitis B infection. Hepatitis C Blood testing is recommended for: Everyone born from [birth year] through 1963. Anyone with known risk factors for hepatitis C. Sexually transmitted  infections (STIs) You should be screened each year for STIs, including gonorrhea and chlamydia, if: You are sexually active and are younger than 79 years of age. You are older than 79 years of age and your health care provider tells you that you are at risk for this type of infection. Your sexual activity has changed since you were last screened, and you are at increased risk for chlamydia or gonorrhea. Ask your health care provider if you are at risk. Ask your health care provider about whether you are at high risk for HIV. Your health care provider may recommend a prescription medicine to help prevent HIV infection. If you choose to take medicine to prevent HIV, you should first get tested for HIV. You should then be tested every 3 months for as long as you are taking the medicine. Follow these instructions at home: Alcohol use Do not drink alcohol if your health care provider tells you not to drink. If you drink alcohol: Limit how much you have to 0-2 drinks a day. Know how much alcohol is in your drink. In the [place] one drink equals one 12 oz bottle of beer (355 mL), one 5 oz glass of wine (148 mL), or one 1  oz glass of hard liquor (44 mL). Lifestyle Do not use any products that contain nicotine or tobacco. These products include cigarettes, chewing tobacco, and vaping devices, such as e-cigarettes. If you need help quitting, ask your health care provider. Do not use street drugs. Do not share needles. Ask your health care provider for help if you need support or information about quitting drugs. General instructions Schedule regular health, dental, and eye exams. Stay current with your vaccines. Tell your health care provider if: You often feel depressed. You have ever been abused or do not feel safe at home. Summary Adopting a healthy lifestyle and getting preventive care are important in promoting health and wellness. Follow your health care provider's instructions about healthy  diet, exercising, and getting tested or screened for diseases. Follow your health care provider's instructions on monitoring your cholesterol and blood pressure. This information is not intended to replace advice given to you by your health care provider. Make sure you discuss any questions you have with your health care provider. Document Revised: 09/14/2020 Document Reviewed: 09/14/2020 Elsevier Patient Education  [place].

## 2022-04-14 ENCOUNTER — Ambulatory Visit (INDEPENDENT_AMBULATORY_CARE_PROVIDER_SITE_OTHER): Payer: Medicare Other | Admitting: Pharmacist

## 2022-04-14 DIAGNOSIS — I10 Essential (primary) hypertension: Secondary | ICD-10-CM

## 2022-04-14 DIAGNOSIS — R7301 Impaired fasting glucose: Secondary | ICD-10-CM

## 2022-04-14 DIAGNOSIS — E785 Hyperlipidemia, unspecified: Secondary | ICD-10-CM

## 2022-04-14 DIAGNOSIS — J449 Chronic obstructive pulmonary disease, unspecified: Secondary | ICD-10-CM

## 2022-04-14 NOTE — Progress Notes (Signed)
Chronic Care Management Pharmacy Note  04/14/2022 Name:  Zachary Waters MRN:  [id number] DOB:  12/29/1940  Summary: addressed IFG, HTN, HLD, COPD. Extensive discussion about COPD progression, logistics of arranging his care between [place], pulmonologists, and pulmonary as well as cardiac rehab.  BP home readings:  120-130s/60-70s  Recommendations/Changes made from today's visit: no changes at this time  Plan: f/u with pharmacist as needed  Subjective: Zachary Waters is an 81 y.o. year old male who is a primary patient of Metheney, Rene Kocher, MD.  The CCM team was consulted for assistance with disease management and care coordination needs.    Engaged with patient by telephone for follow up visit in response to provider referral for pharmacy case management and/or care coordination services.   Consent to Services:  The patient was given information about Chronic Care Management services, agreed to services, and gave verbal consent prior to initiation of services.  Please see initial visit note for detailed documentation.   Patient Care Team: Hali Marry, MD as PCP - General (Family Medicine) Marcy Panning (Pulmonary Disease) Milas Kocher (Chiropractic Medicine) Michael Boston, MD as Consulting Physician (General Surgery) Darius Bump, [facility] as Pharmacist (Pharmacist) Darius Bump, [facility] (Pharmacist)  Recent office visits: 12/11/20 Beatrice Lecher MD(PCP)- patient was seen for Medicare annual. Follow up as planned.   10/01/20 Beatrice Lecher MD- Patient was seen for COPD exacerbation. Patient started Amoxicillin-Pot 875-125 mg BID and taper of Prednisone 20 mg. Patient completed course of  Doxycycline 100 mg. No follow up noted.   Recent consult visits:  01/08/21 Corazon Mulles- Pt seen for Hypothyroidism. Unable to view documentation.   01/05/21 Adnan Javaid (Pulm)- Pt was seen for 4 week f/u for COPD.A Pulm. Function test was performed. No follow up  noted.   01/05/21 Gearlean Alf Ramirez-Lopez (Pulm)- Unable to view documentation.   12/08/20 Dionne Milo (Pulm)- Pt was seen for COPD hospital follow up. Chest xray was performed and peak flow was done. Follow up in 4 weeks.   10/20/20 Dionne Milo (Pulm)- Patient was seen for COPD. Unable to view documentation.   08/28/20 Ronnie Doss (Derm)- Unable to view documentation.   08/24/20 Wendie Chess (Cardiology) Patient was seen for ventricular tachycardia. Unable to view documentation.   Hospital visits:  Patient was seen for Respiratory Distress on 11/24/20 at [facility]. Patient was given Duo Neb x2 By EMS. No discharge summary documented.    Objective:  Lab Results  Component Value Date   CREATININE 1.04 03/17/2022   CREATININE 1.1 12/23/2021   CREATININE 1.27 03/11/2021    Lab Results  Component Value Date   HGBA1C 6.2 (H) 03/17/2022      Component Value Date/Time   CHOL 95 12/23/2021 0000   TRIG 66 12/23/2021 0000   HDL 50 12/23/2021 0000   CHOLHDL 2.0 03/11/2021 0000   VLDL 23 07/10/2014 0912   LDLCALC 32 12/23/2021 0000   LDLCALC 45 03/11/2021 0000       Latest Ref Rng & Units 12/23/2021   12:00 AM 03/11/2021   12:00 AM 03/13/2020    8:54 AM  Hepatic Function  Total Protein 6.4 - 8.2 7.9     6.8  6.3   Albumin 3.5 - 5.0 3.7        AST 14 - 40 _0 ALT 10 - 40 U/L _1 Alk Phosphatase 25 - 125 85  Total Bilirubin 0.2 - 1.2 mg/dL  0.6  0.6      This result is from an external source.    Lab Results  Component Value Date/Time   TSH 0.70 12/23/2021 12:00 AM   TSH 0.40 03/11/2021 12:00 AM   TSH 1.38 03/13/2020 08:54 AM       Latest Ref Rng & Units 12/23/2021   12:00 AM 03/11/2021   12:00 AM 03/13/2020    8:54 AM  CBC  WBC  7.1     6.0  4.3   Hemoglobin 13.5 - 17.5 15.5     14.6  13.4   Hematocrit 41 - 53 47     44.8  41.9   Platelets 150 - 400 K/uL 319     202  188      This result is from an external source.     Lab Results  Component Value Date/Time   VD25OH 36.45 12/23/2021 12:00 AM   VD25OH 35.00 12/22/2014 12:00 AM    Clinical ASCVD: Yes  The ASCVD Risk score (Arnett DK, et al., 2019) failed to calculate for the following reasons:   The patient has a prior MI or stroke diagnosis    Social History   Tobacco Use  Smoking Status Former   Types: Pipe   Quit date: 10/07/2009   Years since quitting: 12.5  Smokeless Tobacco Never   BP Readings from Last 3 Encounters:  03/17/22 136/66  03/10/22 137/64  01/06/22 (!) 148/86   Pulse Readings from Last 3 Encounters:  03/17/22 81  03/10/22 (!) 56  01/06/22 (!) 56   Wt Readings from Last 3 Encounters:  03/17/22 178 lb (80.7 kg)  03/10/22 177 lb 1.9 oz (80.3 kg)  01/06/22 176 lb (79.8 kg)    Assessment: Review of patient past medical history, allergies, medications, health status, including review of consultants reports, laboratory and other test data, was performed as part of comprehensive evaluation and provision of chronic care management services.   SDOH:  (Social Determinants of Health) assessments and interventions performed:  SDOH Interventions    Flowsheet Row Office Visit from 12/11/2020 in [place] Interventions Intervention Not Indicated  Housing Interventions Intervention Not Indicated  Transportation Interventions Intervention Not Indicated  Financial Strain Interventions Intervention Not Indicated  Physical Activity Interventions Intervention Not Indicated  Stress Interventions Intervention Not Indicated  Social Connections Interventions Intervention Not Indicated       CCM Care Plan  No Known Allergies   Medications Reviewed Today     Reviewed by Tinnie Gens, RN (Registered Nurse) on 04/13/22 at [phone]  Med List Status: <None>   Medication Order Taking? Sig Documenting Provider Last Dose Status Informant  albuterol (PROVENTIL) (5  MG/ML) 0.5% nebulizer solution [id number] Yes Take 2.5 mg by nebulization every 6 (six) hours as needed. [provider] Taking Active   albuterol (VENTOLIN HFA) 108 (90 Base) MCG/ACT inhaler [id number] Yes Inhale 2 puffs into the lungs every 6 (six) hours as needed. [provider] Taking Active Self  Camillo Flaming MEDICATION [id number] Yes Medication Name: Overnight pulse oximetry patient has a history of COPD.  Please fax to his DME supplier. Hali Marry, MD Taking Active   Camillo Flaming MEDICATION [id number] Yes Medication Name: Oxygen and supplies, 2 liters/min.  Severe COPD. Hali Marry, MD Taking Active   Ascorbic Acid (VITAMIN C) 1000 MG tablet [id number] Yes Take 2,000 mg by mouth  daily. [provider] Taking Active Self  aspirin EC 81 MG tablet [id number] Yes Take 81 mg by mouth daily. [provider] Taking Active   atorvastatin (LIPITOR) 80 MG tablet [id number] Yes Take 80 mg by mouth daily. [provider] Taking Active   budesonide (PULMICORT) 1 MG/2ML nebulizer solution [id number] Yes Take 2 mLs (1 mg total) by nebulization in the morning and at bedtime. Hali Marry, MD Taking Active   carvedilol (COREG) 6.25 MG tablet [id number] Yes Take 6.25 mg by mouth 2 (two) times daily. Hayden Rasmussen IV, NP Taking Active   fluticasone-salmeterol (ADVAIR) 100-50 MCG/ACT AEPB [id number] Yes Inhale 2 puffs into the lungs 2 (two) times daily. [provider] Taking Active   levothyroxine (SYNTHROID) 112 MCG tablet [id number] Yes TAKE 1 TABLET BY MOUTH EVERY DAY BEFORE BREAKFAST Hali Marry, MD Taking Active   Omega-3 Fatty Acids (FISH OIL) 1000 MG CAPS [id number] Yes Take 1 capsule by mouth daily. [provider] Taking Active Self  predniSONE (DELTASONE) 20 MG tablet [id number]  Take 1 tablet (20 mg total) by mouth daily with breakfast. Hali Marry, MD  Active   QUERCETIN PO  [id number] Yes Take 1 tablet by mouth daily. Pt reports taking 1 tablet daily [provider] Taking Active Self  tiotropium (SPIRIVA HANDIHALER) 18 MCG inhalation capsule [id number] Yes Place 1 capsule (18 mcg total) into inhaler and inhale daily.  Patient taking differently: Place 18 mcg into inhaler and inhale 2 (two) times daily.   Hali Marry, MD Taking Active             Patient Active Problem List   Diagnosis Date Noted   Nonischemic cardiomyopathy ([place]) 03/10/2021   History of MI (myocardial infarction) 80/99/8338   Diastolic dysfunction without heart failure 01/20/2020   Coronary artery disease involving native coronary artery of native heart with angina pectoris ([place]) 01/11/2020   History of smoking 01/06/2020   Asymmetrical deep tendon reflexes 03/07/2019   Hyperlipidemia 02/02/2018   Pain in joint of right shoulder 02/26/2017   Coronary artery calcification seen on CAT scan 11/01/2016   History of adenomatous polyp of colon 06/15/2016   Recurrent left inguinal hernia s/p lap repair with mesh 04/21/2016 04/21/2016   Femoral hernia of right side s/p lap repair with mesh 04/21/2016 04/21/2016   IFG (impaired fasting glucose) 01/08/2016   Milk intolerance 09/11/2015   Thyroid activity decreased 09/11/2015   Centrilobular emphysema ([place]) 09/11/2015   Essential hypertension 07/30/2015   Cervical spondylosis with radiculopathy 03/08/2012   Left shoulder, arm, and hand pain 01/06/2012   Hypothyroid 10/20/2010   COPD, group D, by GOLD 2017 classification ([place]) 10/20/2010    Immunization History  Administered Date(s) Administered   COVID-19, mRNA, vaccine(Comirnaty)12 years and older 03/10/2022   Fluad Quad(high Dose 65+) 02/12/2020, 02/24/2021   Influenza Inj Mdck Quad Pf 03/01/2022   Influenza Split 02/06/2013   Influenza, High Dose Seasonal PF 01/24/2012, 02/04/2016, 02/02/2017, 02/06/2018, 02/05/2019   Influenza,inj,Quad PF,6+ Mos 02/12/2013,  01/09/2014, 01/13/2015, 02/07/2016   Moderna Covid-19 Vaccine Bivalent Booster 65yr & up 02/24/2021   Moderna Sars-Covid-2 Vaccination 05/30/2019, 06/11/2019, 07/02/2019, 03/26/2020, 08/24/2020   PNEUMOCOCCAL CONJUGATE-20 03/17/2022   Pneumococcal Conjugate-13 08/06/2013, 12/20/2013, 05/15/2019   Pneumococcal Polysaccharide-23 05/09/2010   Pneumococcal-Unspecified 06/09/2010   Tdap 01/03/2012, 12/23/2021   Zoster Recombinat (Shingrix) 02/09/2017, 04/13/2017   Zoster, Live 10/27/2010, 02/09/2017    Conditions to be addressed/monitored: HTN, HLD, and IFG , COPD    Medication Assistance: None required.  Patient affirms current coverage meets needs.  Patient's [facility] pharmacy [facility]:  [id number] [id number]: [phone]: [phone]  Uses pill box? Yes Pt endorses 100% compliance  Follow Up:  Patient agrees to Care Plan and Follow-up.  Plan: The patient has been provided with contact information for the care management team and has been advised to call with any health related questions or concerns.   KLarinda Buttery PharmD Clinical Pharmacist [facility]ter[phone]

## 2022-04-15 DIAGNOSIS — J432 Centrilobular emphysema: Secondary | ICD-10-CM | POA: Diagnosis not present

## 2022-04-15 DIAGNOSIS — J441 Chronic obstructive pulmonary disease with (acute) exacerbation: Secondary | ICD-10-CM | POA: Diagnosis not present

## 2022-04-15 DIAGNOSIS — J449 Chronic obstructive pulmonary disease, unspecified: Secondary | ICD-10-CM | POA: Diagnosis not present

## 2022-05-05 ENCOUNTER — Telehealth: Payer: Self-pay | Admitting: *Deleted

## 2022-05-05 NOTE — Telephone Encounter (Signed)
Pt called and stated that he is experiencing another COPD exacerbation. He said that he has felt like this for the past 1.5 weeks. He has been using his inhalers,nebulizer,and Nyquil to get some relief but unfortunately this hasn't really helped. He said that this is what he usually comes in and sees Dr. Madilyn Fireman for and she treats him with ABX and possibly steroids. Pt advised that Dr. Madilyn Fireman is not in the office today and she doesn't have any appointments tomorrow. He said that he understood and would be willing to wait until early next week to be seen.   Pt was advise to seek emergent care should his sxs get worse he voiced understanding and agreed.   Will fwd to pcp.

## 2022-05-06 ENCOUNTER — Encounter: Payer: Self-pay | Admitting: Family Medicine

## 2022-05-06 ENCOUNTER — Ambulatory Visit (INDEPENDENT_AMBULATORY_CARE_PROVIDER_SITE_OTHER): Payer: Medicare Other | Admitting: Family Medicine

## 2022-05-06 VITALS — BP 126/56 | HR 56 | Temp 97.6°F | Wt 180.0 lb

## 2022-05-06 DIAGNOSIS — J441 Chronic obstructive pulmonary disease with (acute) exacerbation: Secondary | ICD-10-CM

## 2022-05-06 MED ORDER — AMOXICILLIN-POT CLAVULANATE 875-125 MG PO TABS: 1.0000 | ORAL_TABLET | Freq: Two times a day (BID) | ORAL | 0 refills | Status: DC

## 2022-05-06 MED ORDER — PREDNISONE 20 MG PO TABS: ORAL_TABLET | ORAL | 0 refills | Status: AC

## 2022-05-06 NOTE — Progress Notes (Signed)
   Acute Office Visit  Subjective:     Patient ID: Zachary Waters, male    DOB: February 10, 1941, 81 y.o.   MRN: [id number]  Chief Complaint  Patient presents with   COPD    HPI Patient is in today for Cough, runny, SOB for about 2 weeks.  He says that he held a couple tabs of prednisone leftover and he used it over Christmas even Christmas Day and actually got relief for about 3 to 4 days with his breathing but then it all came back again.  He did test for COVID and it was negative.  He says his wife has been sick with cold symptoms as well.  Feels like the nebulizers that he is using for COPD do not work any better than the inhalers and is a little bit more time-consuming.  ROS      Objective:    BP (!) 126/56   Pulse (!) 56   Temp 97.6 F (36.4 C)   Wt 180 lb (81.6 kg)   SpO2 99%   BMI 26.58 kg/m    Physical Exam  No results found for any visits on 05/06/22.      Assessment & Plan:   Problem List Items Addressed This Visit   None Visit Diagnoses     COPD exacerbation ([place])    -  Primary   Relevant Medications   predniSONE (DELTASONE) 20 MG tablet      COPD exacerbation-it sounds like it probably started with a viral illness.  Will go ahead and treat with Augmentin and prednisone.  Call if not better in 1 week.  He is can to speak with the pulmonologist about the neb treatments.  He is also going to start back into pulmonary rehab at the end of January through the [place].  Meds ordered this encounter  Medications   amoxicillin-clavulanate (AUGMENTIN) 875-125 MG tablet    Sig: Take 1 tablet by mouth 2 (two) times daily.    Dispense:  14 tablet    Refill:  0   predniSONE (DELTASONE) 20 MG tablet    Sig: Take 2 tablets (40 mg total) by mouth daily with breakfast for 5 days, THEN 1 tablet (20 mg total) daily with breakfast for 5 days.    Dispense:  15 tablet    Refill:  0    No follow-ups on file.  Beatrice Lecher, MD

## 2022-05-06 NOTE — Telephone Encounter (Signed)
Pt called and switched to today.

## 2022-05-06 NOTE — Telephone Encounter (Signed)
Sorry just checked this. I got him scheduled for Tuesday at 3pm. Unless you would like him to be seen today?

## 2022-05-06 NOTE — Telephone Encounter (Signed)
Can you see if he can com in now?

## 2022-05-10 ENCOUNTER — Ambulatory Visit: Payer: Medicare Other | Admitting: Family Medicine

## 2022-05-16 DIAGNOSIS — J441 Chronic obstructive pulmonary disease with (acute) exacerbation: Secondary | ICD-10-CM | POA: Diagnosis not present

## 2022-05-16 DIAGNOSIS — J449 Chronic obstructive pulmonary disease, unspecified: Secondary | ICD-10-CM | POA: Diagnosis not present

## 2022-05-16 DIAGNOSIS — J432 Centrilobular emphysema: Secondary | ICD-10-CM | POA: Diagnosis not present

## 2022-05-30 DIAGNOSIS — Z95810 Presence of automatic (implantable) cardiac defibrillator: Secondary | ICD-10-CM | POA: Diagnosis not present

## 2022-05-30 DIAGNOSIS — I472 Ventricular tachycardia, unspecified: Secondary | ICD-10-CM | POA: Diagnosis not present

## 2022-06-01 DIAGNOSIS — J9611 Chronic respiratory failure with hypoxia: Secondary | ICD-10-CM | POA: Diagnosis not present

## 2022-06-01 DIAGNOSIS — R918 Other nonspecific abnormal finding of lung field: Secondary | ICD-10-CM | POA: Diagnosis not present

## 2022-06-01 DIAGNOSIS — J449 Chronic obstructive pulmonary disease, unspecified: Secondary | ICD-10-CM | POA: Diagnosis not present

## 2022-06-03 DIAGNOSIS — J449 Chronic obstructive pulmonary disease, unspecified: Secondary | ICD-10-CM | POA: Diagnosis not present

## 2022-06-05 ENCOUNTER — Other Ambulatory Visit: Payer: Self-pay | Admitting: Family Medicine

## 2022-06-06 ENCOUNTER — Telehealth: Payer: Self-pay | Admitting: Licensed Clinical Social Worker

## 2022-06-06 DIAGNOSIS — J449 Chronic obstructive pulmonary disease, unspecified: Secondary | ICD-10-CM | POA: Diagnosis not present

## 2022-06-06 NOTE — Patient Outreach (Signed)
  Care Coordination   06/06/2022 Name: Hitesh Fouche MRN: [id number] DOB: March 31, 1941   Care Coordination Outreach Attempts:  An unsuccessful telephone outreach was attempted today to offer the patient information about available care coordination services as a benefit of their health plan.   Follow Up Plan:  Additional outreach attempts will be made to offer the patient care coordination information and services.   Encounter Outcome:  No Answer   Care Coordination Interventions:  No, not indicated    Casimer Lanius, [place] [phone]

## 2022-06-08 DIAGNOSIS — J449 Chronic obstructive pulmonary disease, unspecified: Secondary | ICD-10-CM | POA: Diagnosis not present

## 2022-06-16 DIAGNOSIS — J441 Chronic obstructive pulmonary disease with (acute) exacerbation: Secondary | ICD-10-CM | POA: Diagnosis not present

## 2022-06-16 DIAGNOSIS — J449 Chronic obstructive pulmonary disease, unspecified: Secondary | ICD-10-CM | POA: Diagnosis not present

## 2022-06-16 DIAGNOSIS — J432 Centrilobular emphysema: Secondary | ICD-10-CM | POA: Diagnosis not present

## 2022-06-20 ENCOUNTER — Encounter: Payer: Self-pay | Admitting: Family Medicine

## 2022-06-20 ENCOUNTER — Ambulatory Visit (INDEPENDENT_AMBULATORY_CARE_PROVIDER_SITE_OTHER): Payer: Medicare Other | Admitting: Family Medicine

## 2022-06-20 VITALS — BP 157/71 | HR 65 | Ht 69.0 in | Wt 183.0 lb

## 2022-06-20 DIAGNOSIS — J441 Chronic obstructive pulmonary disease with (acute) exacerbation: Secondary | ICD-10-CM | POA: Diagnosis not present

## 2022-06-20 MED ORDER — DOXYCYCLINE HYCLATE 100 MG PO TABS: 100.0000 mg | ORAL_TABLET | Freq: Two times a day (BID) | ORAL | 0 refills | Status: DC

## 2022-06-20 MED ORDER — PREDNISONE 20 MG PO TABS: 40.0000 mg | ORAL_TABLET | Freq: Every day | ORAL | 0 refills | Status: DC

## 2022-06-20 MED ORDER — DOXYCYCLINE HYCLATE 100 MG PO TABS: 100.0000 mg | ORAL_TABLET | Freq: Two times a day (BID) | ORAL | 1 refills | Status: DC

## 2022-06-20 NOTE — Progress Notes (Signed)
Acute Office Visit  Subjective:     Patient ID: Zachary Waters, male    DOB: September 06, 1940, 82 y.o.   MRN: [id number]  Chief Complaint  Patient presents with   Cough   COPD    HPI Patient is in today for Cough that started 4 days ago.Has been using his home oxygen.  Has been sneezing.  Going to pulm rehab, had to miss a session on Friday because he felt so bad he mostly just laid around and sat around on Friday and Saturday.  He did start develop nasal congestion.  That he feels like the nasal congestion just a little bit better today.  He did run a low-grade temp initially at 99.3.  He was able to go to pulmonary rehab this morning.  Been using his neb treatments twice a day.  ROS      Objective:    BP (!) 157/71   Pulse 65   Ht 5' 9"$  (1.753 m)   Wt 183 lb (83 kg)   SpO2 96% Comment: 2L  BMI 27.02 kg/m    Physical Exam Constitutional:      Appearance: He is well-developed.  HENT:     Head: Normocephalic and atraumatic.     Right Ear: Tympanic membrane, ear canal and external ear normal.     Left Ear: Tympanic membrane, ear canal and external ear normal.     Nose: Nose normal.     Mouth/Throat:     Pharynx: Oropharynx is clear.  Eyes:     Conjunctiva/sclera: Conjunctivae normal.     Pupils: Pupils are equal, round, and reactive to light.  Neck:     Thyroid: No thyromegaly.  Cardiovascular:     Rate and Rhythm: Normal rate.     Heart sounds: Normal heart sounds.  Pulmonary:     Effort: Pulmonary effort is normal.     Breath sounds: Wheezing and rhonchi present.     Comments: Diffuse wheezing and ronchi.  Musculoskeletal:     Cervical back: Neck supple.  Lymphadenopathy:     Cervical: No cervical adenopathy.  Skin:    General: Skin is warm and dry.  Neurological:     Mental Status: He is alert and oriented to person, place, and time.     No results found for any visits on 06/20/22.      Assessment & Plan:   Problem List Items Addressed This  Visit   None Visit Diagnoses     COPD exacerbation ([place])    -  Primary   Relevant Medications   predniSONE (DELTASONE) 20 MG tablet       COPD exacerbation-will treat with prednisone and doxycycline.  Call if not better in 1 week.  Meds ordered this encounter  Medications   predniSONE (DELTASONE) 20 MG tablet    Sig: Take 2 tablets (40 mg total) by mouth daily with breakfast.    Dispense:  10 tablet    Refill:  0   DISCONTD: doxycycline (VIBRA-TABS) 100 MG tablet    Sig: Take 1 tablet (100 mg total) by mouth 2 (two) times daily.    Dispense:  14 tablet    Refill:  0   doxycycline (VIBRA-TABS) 100 MG tablet    Sig: Take 1 tablet (100 mg total) by mouth 2 (two) times daily.    Dispense:  14 tablet    Refill:  1    Return if symptoms worsen or fail to improve.  Beatrice Lecher, MD

## 2022-06-23 DIAGNOSIS — I428 Other cardiomyopathies: Secondary | ICD-10-CM | POA: Diagnosis not present

## 2022-06-27 DIAGNOSIS — Z1152 Encounter for screening for COVID-19: Secondary | ICD-10-CM | POA: Diagnosis not present

## 2022-06-27 DIAGNOSIS — J441 Chronic obstructive pulmonary disease with (acute) exacerbation: Secondary | ICD-10-CM | POA: Diagnosis not present

## 2022-06-27 DIAGNOSIS — I700 Atherosclerosis of aorta: Secondary | ICD-10-CM | POA: Diagnosis not present

## 2022-06-27 DIAGNOSIS — I10 Essential (primary) hypertension: Secondary | ICD-10-CM | POA: Diagnosis not present

## 2022-06-27 DIAGNOSIS — R069 Unspecified abnormalities of breathing: Secondary | ICD-10-CM | POA: Diagnosis not present

## 2022-06-27 DIAGNOSIS — E039 Hypothyroidism, unspecified: Secondary | ICD-10-CM | POA: Diagnosis not present

## 2022-06-27 DIAGNOSIS — R6889 Other general symptoms and signs: Secondary | ICD-10-CM | POA: Diagnosis not present

## 2022-06-27 DIAGNOSIS — E860 Dehydration: Secondary | ICD-10-CM | POA: Diagnosis not present

## 2022-06-27 DIAGNOSIS — R918 Other nonspecific abnormal finding of lung field: Secondary | ICD-10-CM | POA: Diagnosis not present

## 2022-06-27 DIAGNOSIS — Z66 Do not resuscitate: Secondary | ICD-10-CM | POA: Diagnosis not present

## 2022-06-27 DIAGNOSIS — Z87891 Personal history of nicotine dependence: Secondary | ICD-10-CM | POA: Diagnosis not present

## 2022-06-27 DIAGNOSIS — J9611 Chronic respiratory failure with hypoxia: Secondary | ICD-10-CM | POA: Diagnosis not present

## 2022-06-27 DIAGNOSIS — Z950 Presence of cardiac pacemaker: Secondary | ICD-10-CM | POA: Diagnosis not present

## 2022-06-27 DIAGNOSIS — R0602 Shortness of breath: Secondary | ICD-10-CM | POA: Diagnosis not present

## 2022-06-27 DIAGNOSIS — R0600 Dyspnea, unspecified: Secondary | ICD-10-CM | POA: Diagnosis not present

## 2022-06-27 DIAGNOSIS — E785 Hyperlipidemia, unspecified: Secondary | ICD-10-CM | POA: Diagnosis not present

## 2022-06-27 DIAGNOSIS — J9809 Other diseases of bronchus, not elsewhere classified: Secondary | ICD-10-CM | POA: Diagnosis not present

## 2022-06-27 DIAGNOSIS — J9621 Acute and chronic respiratory failure with hypoxia: Secondary | ICD-10-CM | POA: Diagnosis not present

## 2022-06-27 DIAGNOSIS — Z743 Need for continuous supervision: Secondary | ICD-10-CM | POA: Diagnosis not present

## 2022-06-27 DIAGNOSIS — E875 Hyperkalemia: Secondary | ICD-10-CM | POA: Diagnosis not present

## 2022-06-27 DIAGNOSIS — J432 Centrilobular emphysema: Secondary | ICD-10-CM | POA: Diagnosis not present

## 2022-07-01 ENCOUNTER — Encounter: Payer: Self-pay | Admitting: Family Medicine

## 2022-07-01 DIAGNOSIS — J449 Chronic obstructive pulmonary disease, unspecified: Secondary | ICD-10-CM | POA: Diagnosis not present

## 2022-07-01 DIAGNOSIS — R918 Other nonspecific abnormal finding of lung field: Secondary | ICD-10-CM | POA: Diagnosis not present

## 2022-07-01 DIAGNOSIS — J9611 Chronic respiratory failure with hypoxia: Secondary | ICD-10-CM | POA: Diagnosis not present

## 2022-07-01 DIAGNOSIS — Z87891 Personal history of nicotine dependence: Secondary | ICD-10-CM | POA: Diagnosis not present

## 2022-07-06 NOTE — Telephone Encounter (Signed)
Please schedule him for hospital f/u per his note.

## 2022-07-12 ENCOUNTER — Encounter: Payer: Self-pay | Admitting: Family Medicine

## 2022-07-12 ENCOUNTER — Ambulatory Visit (INDEPENDENT_AMBULATORY_CARE_PROVIDER_SITE_OTHER): Payer: Medicare Other | Admitting: Family Medicine

## 2022-07-12 VITALS — BP 134/59 | HR 87 | Resp 20 | Ht 69.0 in | Wt 174.0 lb

## 2022-07-12 DIAGNOSIS — K219 Gastro-esophageal reflux disease without esophagitis: Secondary | ICD-10-CM | POA: Diagnosis not present

## 2022-07-12 DIAGNOSIS — J449 Chronic obstructive pulmonary disease, unspecified: Secondary | ICD-10-CM | POA: Diagnosis not present

## 2022-07-12 DIAGNOSIS — R911 Solitary pulmonary nodule: Secondary | ICD-10-CM | POA: Diagnosis not present

## 2022-07-12 DIAGNOSIS — J441 Chronic obstructive pulmonary disease with (acute) exacerbation: Secondary | ICD-10-CM

## 2022-07-12 DIAGNOSIS — I10 Essential (primary) hypertension: Secondary | ICD-10-CM | POA: Diagnosis not present

## 2022-07-12 MED ORDER — HYDROCOD POLI-CHLORPHE POLI ER 10-8 MG/5ML PO SUER: 5.0000 mL | Freq: Two times a day (BID) | ORAL | 0 refills | Status: DC | PRN

## 2022-07-12 MED ORDER — AMBULATORY NON FORMULARY MEDICATION: 0 refills | Status: DC

## 2022-07-12 NOTE — Assessment & Plan Note (Signed)
Pulmonology will follow-up in 8 weeks on the pulmonary nodule noted on the CTA done in the hospital.

## 2022-07-12 NOTE — Progress Notes (Signed)
Established Patient Office Visit  Subjective   Patient ID: Zachary Waters, male    DOB: 04-20-1941  Age: 82 y.o. MRN: [id number]  Chief Complaint  Patient presents with   Hospitalization Follow-up    Patient was in hospital from 02/18-02/22, Patient states that he was having issues with Shortness of breath    HPI  Her for hospital follow up at [facility] from 2/19 for COPD exacerbation.  We had treated him here in the office initially several days prior but he continued to get worse and then developed chest pain with nausea and vomiting so went to the emergency department.  Discharged home with azithromycin, prednisone taper and Tussionex.  Followed up with Pulm on 2/23. There is a 1.4 x 1.2 cm RLL nodule seen on his chest CT that could be infection and will repeat a chest CT in 8 weeks to evaluate for resolution.   He had been having more GERD symptoms recently and had some Prilosec at home.  He started taking it and got pretty quick resolution of his symptoms.  He only took 3 tabs total and has not had any symptoms in almost a week.  That seems to have resolved himself.  He has lost about 10 pounds since all of this started but he actually feels like his breathing has been better since he lost the 10 pounds.  He does not currently wear his oxygen overnight.  In fact he does not wear it most of the day he just uses it sometimes if he feels more short of breath or if his pulse ox is down.    ROS    Objective:     BP (!) 134/59   Pulse 87   Resp 20   Ht '5\' 9"'$  (1.753 m)   Wt 174 lb (78.9 kg)   SpO2 93%   BMI 25.70 kg/m    Physical Exam Constitutional:      Appearance: He is well-developed.  HENT:     Head: Normocephalic and atraumatic.  Cardiovascular:     Rate and Rhythm: Normal rate and regular rhythm.     Heart sounds: Normal heart sounds.  Pulmonary:     Effort: Pulmonary effort is normal.     Comments: Coarse BS bilat. No crackle Skin:    General: Skin  is warm and dry.  Neurological:     Mental Status: He is alert and oriented to person, place, and time.  Psychiatric:        Behavior: Behavior normal.      No results found for any visits on 07/12/22.    The ASCVD Risk score (Arnett DK, et al., 2019) failed to calculate for the following reasons:   The patient has a prior MI or stroke diagnosis    Assessment & Plan:   Problem List Items Addressed This Visit       Cardiovascular and Mediastinum   Essential hypertension    Blood pressure at goal today.  Continue current regimen.        Respiratory   Pulmonary nodule, right - Primary    Pulmonology will follow-up in 8 weeks on the pulmonary nodule noted on the CTA done in the hospital.      COPD, group D, by GOLD 2017 classification [facility])    He does follow with pulmonology at the [facility] and here locally.  I did encourage him to wear his oxygen when he is exercising most of the time he does not want to  wear it.  We did discuss getting an overnight pulse oximetry as well to see if he might benefit from wearing it at night 2.  He is restarting pulmonary rehab tomorrow.  He has completed the antibiotics and the prednisone.  He did want a refill on the Tussionex just to have on hand he said it was really helpful.  Just encouraged him to use it sparingly.      Relevant Medications   chlorpheniramine-HYDROcodone (TUSSIONEX) 10-8 MG/5ML   AMBULATORY NON FORMULARY MEDICATION   Other Visit Diagnoses     Chronic obstructive pulmonary disease with acute exacerbation (HCC)       Relevant Medications   chlorpheniramine-HYDROcodone (TUSSIONEX) 10-8 MG/5ML   AMBULATORY NON FORMULARY MEDICATION   Gastroesophageal reflux disease without esophagitis          GERD -Symptoms already improved.  Try to avoid trigger foods can use Prilosec as needed.  No follow-ups on file.    Beatrice Lecher, MD

## 2022-07-12 NOTE — Assessment & Plan Note (Signed)
Blood pressure at goal today.  Continue current regimen.

## 2022-07-12 NOTE — Assessment & Plan Note (Signed)
He does follow with pulmonology at the [facility] and here locally.  I did encourage him to wear his oxygen when he is exercising most of the time he does not want to wear it.  We did discuss getting an overnight pulse oximetry as well to see if he might benefit from wearing it at night 2.  He is restarting pulmonary rehab tomorrow.  He has completed the antibiotics and the prednisone.  He did want a refill on the Tussionex just to have on hand he said it was really helpful.  Just encouraged him to use it sparingly.

## 2022-07-15 DIAGNOSIS — J432 Centrilobular emphysema: Secondary | ICD-10-CM | POA: Diagnosis not present

## 2022-07-15 DIAGNOSIS — J441 Chronic obstructive pulmonary disease with (acute) exacerbation: Secondary | ICD-10-CM | POA: Diagnosis not present

## 2022-07-15 DIAGNOSIS — J449 Chronic obstructive pulmonary disease, unspecified: Secondary | ICD-10-CM | POA: Diagnosis not present

## 2022-08-08 DIAGNOSIS — J449 Chronic obstructive pulmonary disease, unspecified: Secondary | ICD-10-CM | POA: Diagnosis not present

## 2022-08-10 DIAGNOSIS — J449 Chronic obstructive pulmonary disease, unspecified: Secondary | ICD-10-CM | POA: Diagnosis not present

## 2022-08-12 DIAGNOSIS — J449 Chronic obstructive pulmonary disease, unspecified: Secondary | ICD-10-CM | POA: Diagnosis not present

## 2022-08-15 DIAGNOSIS — J449 Chronic obstructive pulmonary disease, unspecified: Secondary | ICD-10-CM | POA: Diagnosis not present

## 2022-08-15 DIAGNOSIS — J441 Chronic obstructive pulmonary disease with (acute) exacerbation: Secondary | ICD-10-CM | POA: Diagnosis not present

## 2022-08-15 DIAGNOSIS — J432 Centrilobular emphysema: Secondary | ICD-10-CM | POA: Diagnosis not present

## 2022-08-17 DIAGNOSIS — J449 Chronic obstructive pulmonary disease, unspecified: Secondary | ICD-10-CM | POA: Diagnosis not present

## 2022-08-22 DIAGNOSIS — J449 Chronic obstructive pulmonary disease, unspecified: Secondary | ICD-10-CM | POA: Diagnosis not present

## 2022-08-24 DIAGNOSIS — J449 Chronic obstructive pulmonary disease, unspecified: Secondary | ICD-10-CM | POA: Diagnosis not present

## 2022-08-26 DIAGNOSIS — J449 Chronic obstructive pulmonary disease, unspecified: Secondary | ICD-10-CM | POA: Diagnosis not present

## 2022-08-29 DIAGNOSIS — J439 Emphysema, unspecified: Secondary | ICD-10-CM | POA: Diagnosis not present

## 2022-08-29 DIAGNOSIS — I2510 Atherosclerotic heart disease of native coronary artery without angina pectoris: Secondary | ICD-10-CM | POA: Diagnosis not present

## 2022-08-29 DIAGNOSIS — J9811 Atelectasis: Secondary | ICD-10-CM | POA: Diagnosis not present

## 2022-08-29 DIAGNOSIS — I472 Ventricular tachycardia, unspecified: Secondary | ICD-10-CM | POA: Diagnosis not present

## 2022-08-29 DIAGNOSIS — R918 Other nonspecific abnormal finding of lung field: Secondary | ICD-10-CM | POA: Diagnosis not present

## 2022-08-29 DIAGNOSIS — J449 Chronic obstructive pulmonary disease, unspecified: Secondary | ICD-10-CM | POA: Diagnosis not present

## 2022-08-29 DIAGNOSIS — Z87891 Personal history of nicotine dependence: Secondary | ICD-10-CM | POA: Diagnosis not present

## 2022-08-31 DIAGNOSIS — R918 Other nonspecific abnormal finding of lung field: Secondary | ICD-10-CM | POA: Diagnosis not present

## 2022-08-31 DIAGNOSIS — J9611 Chronic respiratory failure with hypoxia: Secondary | ICD-10-CM | POA: Diagnosis not present

## 2022-08-31 DIAGNOSIS — J449 Chronic obstructive pulmonary disease, unspecified: Secondary | ICD-10-CM | POA: Diagnosis not present

## 2022-09-02 DIAGNOSIS — J449 Chronic obstructive pulmonary disease, unspecified: Secondary | ICD-10-CM | POA: Diagnosis not present

## 2022-09-05 ENCOUNTER — Ambulatory Visit (INDEPENDENT_AMBULATORY_CARE_PROVIDER_SITE_OTHER): Payer: Medicare Other | Admitting: Family Medicine

## 2022-09-05 ENCOUNTER — Encounter: Payer: Self-pay | Admitting: Family Medicine

## 2022-09-05 VITALS — BP 106/50 | HR 73 | Resp 20 | Ht 69.0 in | Wt 172.0 lb

## 2022-09-05 DIAGNOSIS — L989 Disorder of the skin and subcutaneous tissue, unspecified: Secondary | ICD-10-CM | POA: Diagnosis not present

## 2022-09-05 DIAGNOSIS — J449 Chronic obstructive pulmonary disease, unspecified: Secondary | ICD-10-CM | POA: Diagnosis not present

## 2022-09-05 DIAGNOSIS — R7301 Impaired fasting glucose: Secondary | ICD-10-CM | POA: Diagnosis not present

## 2022-09-05 DIAGNOSIS — I10 Essential (primary) hypertension: Secondary | ICD-10-CM

## 2022-09-05 NOTE — Assessment & Plan Note (Signed)
BP is OK today

## 2022-09-05 NOTE — Progress Notes (Signed)
Established Patient Office Visit  Subjective   Patient ID: Zachary Waters, male    DOB: Jun 11, 1940  Age: 82 y.o. MRN: [id number]  Chief Complaint  Patient presents with   Wound Infection    RIGHT LOWER LEG BY ANKLE    HPI  He has a "spot" on his right lower medial leg just above the medial malleolus.  He says it has been there for couple months he thought maybe he had injured it initially and was keeping it covered more recently has been keeping it uncovered no active drainage.  He says it is really not painful or bothersome except if he bumps it and it is really painful.   COPD-he is completing his full course of pulmonary rehab and feels like he is really made some great progress.  He is hoping to get back into the gym once he finishes up next week.    ROS    Objective:     BP (!) 106/50 (BP Location: Right Arm, Cuff Size: Normal)   Pulse 73   Resp 20   Ht 5\' 9"  (1.753 m)   Wt 172 lb 0.6 oz (78 kg)   SpO2 94%   BMI 25.41 kg/m    Physical Exam Constitutional:      Appearance: He is well-developed.  HENT:     Head: Normocephalic and atraumatic.  Cardiovascular:     Rate and Rhythm: Normal rate and regular rhythm.     Heart sounds: Normal heart sounds.  Pulmonary:     Effort: Pulmonary effort is normal.     Breath sounds: Normal breath sounds.     Comments: + coarse BS bilat with some exp wheezing.   Skin:    General: Skin is warm and dry.  Neurological:     Mental Status: He is alert and oriented to person, place, and time.  Psychiatric:        Behavior: Behavior normal.      No results found for any visits on 09/05/22.    The ASCVD Risk score (Arnett DK, et al., 2019) failed to calculate for the following reasons:   The patient has a prior MI or stroke diagnosis    Assessment & Plan:   Problem List Items Addressed This Visit       Cardiovascular and Mediastinum   Essential hypertension    BP is OK today.        Relevant Orders    COMPLETE METABOLIC PANEL WITH GFR   TSH   Hemoglobin A1c     Respiratory   COPD, group D, by GOLD 2017 classification (HCC) - Primary    He is doing really well with Pulm Rehab. Hoping to start that exercise routing at the gym.        Relevant Orders   COMPLETE METABOLIC PANEL WITH GFR   TSH   Hemoglobin A1c     Endocrine   IFG (impaired fasting glucose)    Due to recheck.       Relevant Orders   COMPLETE METABOLIC PANEL WITH GFR   TSH   Hemoglobin A1c   Other Visit Diagnoses     Skin lesion of right leg           Skin lesion on right leg is suspicious for a squamous cell carcinoma.  [facility]  So I think it safe to wait there is 3 weeks.  For now  okay to cover at night since that is when he tends to bump it and it is painful.  I do not see anything that looks like active infection or drainage.  I do not know if there was an initial injury or not but it has definitely been present for several weeks.  No follow-ups on file.    Nani Gasser, MD

## 2022-09-05 NOTE — Assessment & Plan Note (Signed)
He is doing really well with Pulm Rehab. Hoping to start that exercise routing at the gym.

## 2022-09-05 NOTE — Assessment & Plan Note (Signed)
Due to recheck

## 2022-09-06 LAB — COMPLETE METABOLIC PANEL WITH GFR
AG Ratio: 2.1 (calc) (ref 1.0–2.5)
ALT: 17 U/L (ref 9–46)
AST: 20 U/L (ref 10–35)
Albumin: 4.5 g/dL (ref 3.6–5.1)
Alkaline phosphatase (APISO): 64 U/L (ref 35–144)
BUN/Creatinine Ratio: 23 (calc) — ABNORMAL HIGH (ref 6–22)
BUN: 33 mg/dL — ABNORMAL HIGH (ref 7–25)
CO2: 29 mmol/L (ref 20–32)
Calcium: 9.4 mg/dL (ref 8.6–10.3)
Chloride: 101 mmol/L (ref 98–110)
Creat: 1.44 mg/dL — ABNORMAL HIGH (ref 0.70–1.28)
Globulin: 2.1 g/dL (calc) (ref 1.9–3.7)
Glucose, Bld: 95 mg/dL (ref 65–99)
Potassium: 6 mmol/L — ABNORMAL HIGH (ref 3.5–5.3)
Sodium: 136 mmol/L (ref 135–146)
Total Bilirubin: 0.4 mg/dL (ref 0.2–1.2)
Total Protein: 6.6 g/dL (ref 6.1–8.1)
eGFR: 49 mL/min/{1.73_m2} — ABNORMAL LOW (ref >=60)

## 2022-09-06 LAB — TSH: TSH: 0.42 mIU/L (ref 0.40–4.50)

## 2022-09-06 LAB — HEMOGLOBIN A1C
Hgb A1c MFr Bld: 6.4 % of total Hgb — ABNORMAL HIGH (ref <5.7)
Mean Plasma Glucose: 137 mg/dL
eAG (mmol/L): 7.6 mmol/L

## 2022-09-06 NOTE — Progress Notes (Signed)
Hi Zachary Waters, A1C is up a little at 6.4. still in prediabetes range. Look dry on labs so increase water intake. Plan to recheck kidneys in 1 mo. Thyroid looks great!

## 2022-09-14 DIAGNOSIS — J441 Chronic obstructive pulmonary disease with (acute) exacerbation: Secondary | ICD-10-CM | POA: Diagnosis not present

## 2022-09-14 DIAGNOSIS — J432 Centrilobular emphysema: Secondary | ICD-10-CM | POA: Diagnosis not present

## 2022-09-14 DIAGNOSIS — J449 Chronic obstructive pulmonary disease, unspecified: Secondary | ICD-10-CM | POA: Diagnosis not present

## 2022-09-15 ENCOUNTER — Ambulatory Visit: Payer: Medicare Other | Admitting: Family Medicine

## 2022-10-13 DIAGNOSIS — M79671 Pain in right foot: Secondary | ICD-10-CM | POA: Diagnosis not present

## 2022-10-13 DIAGNOSIS — M2041 Other hammer toe(s) (acquired), right foot: Secondary | ICD-10-CM | POA: Diagnosis not present

## 2022-10-15 DIAGNOSIS — J441 Chronic obstructive pulmonary disease with (acute) exacerbation: Secondary | ICD-10-CM | POA: Diagnosis not present

## 2022-10-15 DIAGNOSIS — J449 Chronic obstructive pulmonary disease, unspecified: Secondary | ICD-10-CM | POA: Diagnosis not present

## 2022-10-15 DIAGNOSIS — J432 Centrilobular emphysema: Secondary | ICD-10-CM | POA: Diagnosis not present

## 2022-10-25 ENCOUNTER — Telehealth: Payer: Self-pay | Admitting: *Deleted

## 2022-10-25 NOTE — Telephone Encounter (Signed)
Order for renewal of O2. Faxed ,confirmation received, scanned into chart.

## 2022-11-14 DIAGNOSIS — J441 Chronic obstructive pulmonary disease with (acute) exacerbation: Secondary | ICD-10-CM | POA: Diagnosis not present

## 2022-11-14 DIAGNOSIS — J449 Chronic obstructive pulmonary disease, unspecified: Secondary | ICD-10-CM | POA: Diagnosis not present

## 2022-11-14 DIAGNOSIS — J432 Centrilobular emphysema: Secondary | ICD-10-CM | POA: Diagnosis not present

## 2022-11-28 DIAGNOSIS — J449 Chronic obstructive pulmonary disease, unspecified: Secondary | ICD-10-CM | POA: Diagnosis not present

## 2022-11-28 DIAGNOSIS — I472 Ventricular tachycardia, unspecified: Secondary | ICD-10-CM | POA: Diagnosis not present

## 2022-11-28 DIAGNOSIS — R918 Other nonspecific abnormal finding of lung field: Secondary | ICD-10-CM | POA: Diagnosis not present

## 2022-11-28 DIAGNOSIS — J9611 Chronic respiratory failure with hypoxia: Secondary | ICD-10-CM | POA: Diagnosis not present

## 2022-11-28 DIAGNOSIS — Z95810 Presence of automatic (implantable) cardiac defibrillator: Secondary | ICD-10-CM | POA: Diagnosis not present

## 2022-11-29 ENCOUNTER — Other Ambulatory Visit: Payer: Self-pay | Admitting: Family Medicine

## 2022-12-15 DIAGNOSIS — J441 Chronic obstructive pulmonary disease with (acute) exacerbation: Secondary | ICD-10-CM | POA: Diagnosis not present

## 2022-12-15 DIAGNOSIS — J432 Centrilobular emphysema: Secondary | ICD-10-CM | POA: Diagnosis not present

## 2022-12-15 DIAGNOSIS — J449 Chronic obstructive pulmonary disease, unspecified: Secondary | ICD-10-CM | POA: Diagnosis not present

## 2022-12-20 ENCOUNTER — Encounter: Payer: Self-pay | Admitting: Family Medicine

## 2022-12-20 MED ORDER — LEVOTHYROXINE SODIUM 112 MCG PO TABS: 112.0000 ug | ORAL_TABLET | Freq: Every day | ORAL | 2 refills | Status: DC

## 2023-01-24 ENCOUNTER — Encounter: Payer: Self-pay | Admitting: Family Medicine

## 2023-01-25 ENCOUNTER — Ambulatory Visit (INDEPENDENT_AMBULATORY_CARE_PROVIDER_SITE_OTHER): Payer: Medicare Other | Admitting: Family Medicine

## 2023-01-25 ENCOUNTER — Encounter: Payer: Self-pay | Admitting: Family Medicine

## 2023-01-25 VITALS — BP 131/69 | HR 67 | Ht 69.0 in | Wt 176.0 lb

## 2023-01-25 DIAGNOSIS — J441 Chronic obstructive pulmonary disease with (acute) exacerbation: Secondary | ICD-10-CM | POA: Diagnosis not present

## 2023-01-25 MED ORDER — PREDNISONE 20 MG PO TABS: ORAL_TABLET | ORAL | 0 refills | Status: DC

## 2023-01-25 MED ORDER — DOXYCYCLINE HYCLATE 100 MG PO TABS: 100.0000 mg | ORAL_TABLET | Freq: Two times a day (BID) | ORAL | 0 refills | Status: DC

## 2023-01-25 NOTE — Progress Notes (Signed)
Acute Office Visit  Subjective:     Patient ID: Zachary Waters, male    DOB: 06-03-1940, 82 y.o.   MRN: [id number]  Chief Complaint  Patient presents with   Cough    HPI Patient is in today for days of increased cough, sputum production, and nasal congestion.  No fevers chills or sweats.  It is just been not improving.  He still using his nebulizer 3 times a day he is on azithromycin every other day.  Is actually been a while since he had a COPD exacerbation he said he is actually done pretty well since he was put on 5 mg of prednisone.  He really says he just takes it every other day instead of daily.  He also follows at the [place].  ROS      Objective:    BP 131/69   Pulse 67   Ht 5\' 9"  (1.753 m)   Wt 176 lb (79.8 kg)   SpO2 98%   BMI 25.99 kg/m    Physical Exam Constitutional:      Appearance: Normal appearance.  HENT:     Head: Normocephalic and atraumatic.     Right Ear: Tympanic membrane, ear canal and external ear normal. There is no impacted cerumen.     Left Ear: Tympanic membrane, ear canal and external ear normal. There is no impacted cerumen.     Nose: Nose normal.     Mouth/Throat:     Pharynx: Oropharynx is clear.  Eyes:     Conjunctiva/sclera: Conjunctivae normal.  Cardiovascular:     Rate and Rhythm: Normal rate and regular rhythm.  Pulmonary:     Effort: Pulmonary effort is normal.     Breath sounds: Wheezing present.     Comments: Dec BS Musculoskeletal:     Cervical back: Neck supple. No tenderness.  Lymphadenopathy:     Cervical: No cervical adenopathy.  Skin:    General: Skin is warm and dry.  Neurological:     Mental Status: He is alert and oriented to person, place, and time.  Psychiatric:        Mood and Affect: Mood normal.     No results found for any visits on 01/25/23.      Assessment & Plan:   Problem List Items Addressed This Visit   None Visit Diagnoses     COPD exacerbation (HCC)    -  Primary   Relevant  Medications   predniSONE (DELTASONE) 20 MG tablet       Will tx with doxy and prednisone.  Call if not improving continue with routine nebulizer treatments.  Okay to hold the low-dose prednisone until he is completed the burst.  Will do a little bit longer taper.  Continue to wear oxygen as needed.  Discussed doing the flu vaccine and COVID-vaccine in about 2 weeks when he is feeling better.  Meds ordered this encounter  Medications   DISCONTD: doxycycline (VIBRA-TABS) 100 MG tablet    Sig: Take 1 tablet (100 mg total) by mouth 2 (two) times daily.    Dispense:  14 tablet    Refill:  0   doxycycline (VIBRA-TABS) 100 MG tablet    Sig: Take 1 tablet (100 mg total) by mouth 2 (two) times daily.    Dispense:  14 tablet    Refill:  0   predniSONE (DELTASONE) 20 MG tablet    Sig: Take 2 tablets (40 mg total) by mouth daily with breakfast for 5 days,  THEN 1 tablet (20 mg total) daily with breakfast for 5 days.    Dispense:  15 tablet    Refill:  0    No follow-ups on file.  Nani Gasser, MD

## 2023-02-28 DIAGNOSIS — I472 Ventricular tachycardia, unspecified: Secondary | ICD-10-CM | POA: Diagnosis not present

## 2023-02-28 DIAGNOSIS — Z95810 Presence of automatic (implantable) cardiac defibrillator: Secondary | ICD-10-CM | POA: Diagnosis not present

## 2023-03-01 DIAGNOSIS — I519 Heart disease, unspecified: Secondary | ICD-10-CM | POA: Diagnosis not present

## 2023-03-01 DIAGNOSIS — I472 Ventricular tachycardia, unspecified: Secondary | ICD-10-CM | POA: Diagnosis not present

## 2023-03-01 DIAGNOSIS — I469 Cardiac arrest, cause unspecified: Secondary | ICD-10-CM | POA: Diagnosis not present

## 2023-03-01 DIAGNOSIS — I2510 Atherosclerotic heart disease of native coronary artery without angina pectoris: Secondary | ICD-10-CM | POA: Diagnosis not present

## 2023-03-01 DIAGNOSIS — I10 Essential (primary) hypertension: Secondary | ICD-10-CM | POA: Diagnosis not present

## 2023-03-21 ENCOUNTER — Ambulatory Visit (INDEPENDENT_AMBULATORY_CARE_PROVIDER_SITE_OTHER): Payer: Medicare Other | Admitting: Family Medicine

## 2023-03-21 ENCOUNTER — Encounter: Payer: Self-pay | Admitting: Family Medicine

## 2023-03-21 VITALS — BP 122/56 | HR 62 | Ht 69.0 in | Wt 176.0 lb

## 2023-03-21 DIAGNOSIS — R7301 Impaired fasting glucose: Secondary | ICD-10-CM | POA: Diagnosis not present

## 2023-03-21 DIAGNOSIS — Z23 Encounter for immunization: Secondary | ICD-10-CM

## 2023-03-21 DIAGNOSIS — J432 Centrilobular emphysema: Secondary | ICD-10-CM

## 2023-03-21 DIAGNOSIS — I10 Essential (primary) hypertension: Secondary | ICD-10-CM | POA: Diagnosis not present

## 2023-03-21 DIAGNOSIS — Z0000 Encounter for general adult medical examination without abnormal findings: Secondary | ICD-10-CM

## 2023-03-21 NOTE — Progress Notes (Addendum)
Complete physical exam  Patient: Zachary Waters   DOB: [birth year]   83 y.o. Male  MRN: [id number]  Subjective:    Chief Complaint  Patient presents with   Annual Exam    Tyren Fulmer is a 82 y.o. male who presents today for a complete physical exam. He reports consuming a general diet.  Stationary bike and walking.    He generally feels well. Marland Kitchen He does not have additional problems to discuss today.   Has an appointment at the [facility] today.  He did bring me his updated yearly informational sheet with his visits.  He is still trying to stay active.  Does wear his oxygen occasionally, does not have it on today.   3-4 days of nasal congestion. Says it comes and goes.    He urinates frequently.   Most recent fall risk assessment:    03/21/2023    9:29 AM  Fall Risk   Falls in the past year? 0  Number falls in past yr: 0  Injury with Fall? 0  Risk for fall due to : No Fall Risks  Follow up Falls evaluation completed     Most recent depression screenings:    03/21/2023    9:29 AM 09/05/2022    2:35 PM  PHQ 2/9 Scores  PHQ - 2 Score 0 0         Patient Care Team: Agapito Games, MD as PCP - General (Family Medicine) Jenne Pane (Pulmonary Disease) Wyline Mood (Chiropractic Medicine) Karie Soda, MD as Consulting Physician (General Surgery) Gabriel Carina, [facility] as Pharmacist (Pharmacist) Gabriel Carina, [facility] (Pharmacist)   Outpatient Medications Prior to Visit  Medication Sig   albuterol (PROVENTIL) (5 MG/ML) 0.5% nebulizer solution Take 2.5 mg by nebulization every 6 (six) hours as needed.   albuterol (VENTOLIN HFA) 108 (90 Base) MCG/ACT inhaler Inhale 2 puffs into the lungs every 6 (six) hours as needed.   Ascorbic Acid (VITAMIN C) 1000 MG tablet Take 2,000 mg by mouth daily.   aspirin EC 81 MG tablet Take 81 mg by mouth daily.   atorvastatin (LIPITOR) 80 MG tablet Take 80 mg by mouth daily.   levothyroxine (SYNTHROID) 112 MCG tablet Take 1  tablet (112 mcg total) by mouth daily before breakfast.   Omega-3 Fatty Acids (FISH OIL) 1000 MG CAPS Take 1 capsule by mouth daily.   predniSONE (DELTASONE) 5 MG tablet Take 5 mg by mouth.   QUERCETIN PO Take 1 tablet by mouth daily. Pt reports taking 1 tablet daily   [DISCONTINUED] azithromycin (ZITHROMAX) 250 MG tablet Take 250 mg by mouth every other day. Obtains from [place]   [DISCONTINUED] budesonide (PULMICORT) 1 MG/2ML nebulizer solution Take 2 mLs (1 mg total) by nebulization in the morning and at bedtime.   [DISCONTINUED] chlorpheniramine-HYDROcodone (TUSSIONEX) 10-8 MG/5ML Take 5 mLs by mouth every 12 (twelve) hours as needed for cough.   [DISCONTINUED] doxycycline (VIBRA-TABS) 100 MG tablet Take 1 tablet (100 mg total) by mouth 2 (two) times daily.   [DISCONTINUED] predniSONE (DELTASONE) 20 MG tablet Take 2 tablets (40 mg total) by mouth daily with breakfast. (Patient taking differently: Take 5 mg by mouth daily with breakfast.)   No facility-administered medications prior to visit.    ROS        Objective:     BP (!) 122/56   Pulse 62   Ht 5\' 9"  (1.753 m)   Wt 176 lb (79.8 kg)   SpO2 100%  BMI 25.99 kg/m     Physical Exam Constitutional:      Appearance: Normal appearance.  HENT:     Head: Normocephalic and atraumatic.     Right Ear: Tympanic membrane, ear canal and external ear normal.     Left Ear: Tympanic membrane, ear canal and external ear normal.     Nose: Nose normal.     Mouth/Throat:     Pharynx: Oropharynx is clear.  Eyes:     Extraocular Movements: Extraocular movements intact.     Conjunctiva/sclera: Conjunctivae normal.     Pupils: Pupils are equal, round, and reactive to light.  Neck:     Thyroid: No thyromegaly.  Cardiovascular:     Rate and Rhythm: Normal rate and regular rhythm.  Pulmonary:     Effort: Pulmonary effort is normal.     Breath sounds: Normal breath sounds.  Abdominal:     General: Bowel sounds are normal.     Palpations:  Abdomen is soft.     Tenderness: There is no abdominal tenderness.  Musculoskeletal:        General: No swelling.     Cervical back: Neck supple.  Skin:    General: Skin is warm and dry.  Neurological:     Mental Status: He is oriented to person, place, and time.  Psychiatric:        Mood and Affect: Mood normal.        Behavior: Behavior normal.      Results for orders placed or performed in visit on 03/21/23  HgB A1c  Result Value Ref Range   Hgb A1c MFr Bld 6.1 (H) 4.8 - 5.6 %   Est. average glucose Bld gHb Est-mCnc 128 mg/dL  BJY78+GNFA  Result Value Ref Range   Glucose 91 70 - 99 mg/dL   BUN 31 (H) 8 - 27 mg/dL   Creatinine, Ser 2.13 (H) 0.76 - 1.27 mg/dL   eGFR 50 (L) >08 MV/HQI/6.96   BUN/Creatinine Ratio 22 10 - 24   Sodium 142 134 - 144 mmol/L   Potassium 5.6 (H) 3.5 - 5.2 mmol/L   Chloride 102 96 - 106 mmol/L   CO2 23 20 - 29 mmol/L   Calcium 9.2 8.6 - 10.2 mg/dL   Total Protein 6.7 6.0 - 8.5 g/dL   Albumin 4.6 3.8 - 4.8 g/dL   Globulin, Total 2.1 1.5 - 4.5 g/dL   Bilirubin Total 0.5 0.0 - 1.2 mg/dL   Alkaline Phosphatase 70 44 - 121 IU/L   AST 24 0 - 40 IU/L   ALT 18 0 - 44 IU/L  Lipid Panel With LDL/HDL Ratio  Result Value Ref Range   Cholesterol, Total 152 100 - 199 mg/dL   Triglycerides 73 0 - 149 mg/dL   HDL 76 >29 mg/dL   VLDL Cholesterol Cal 14 5 - 40 mg/dL   LDL Chol Calc (NIH) 62 0 - 99 mg/dL   LDL/HDL Ratio 0.8 0.0 - 3.6 ratio  CBC  Result Value Ref Range   WBC 7.6 3.4 - 10.8 x10E3/uL   RBC 5.08 4.14 - 5.80 x10E6/uL   Hemoglobin 15.5 13.0 - 17.7 g/dL   Hematocrit 52.8 41.3 - 51.0 %   MCV 97 79 - 97 fL   MCH 30.5 26.6 - 33.0 pg   MCHC 31.6 31.5 - 35.7 g/dL   RDW 24.4 01.0 - 27.2 %   Platelets 229 150 - 450 x10E3/uL        Assessment & Plan:  Routine Health Maintenance and Physical Exam  Immunization History  Administered Date(s) Administered   Fluad Quad(high Dose 65+) 02/12/2020, 02/24/2021   Fluad Trivalent(High Dose 65+)  03/21/2023   Influenza Inj Mdck Quad Pf 03/01/2022   Influenza Split 02/06/2013   Influenza, High Dose Seasonal PF 01/24/2012, 02/04/2016, 02/02/2017, 02/06/2018, 02/05/2019   Influenza,inj,Quad PF,6+ Mos 02/12/2013, 01/09/2014, 01/13/2015, 02/07/2016   Moderna Covid-19 Vaccine Bivalent Booster 57yrs & up 02/24/2021   Moderna Sars-Covid-2 Vaccination 05/30/2019, 06/11/2019, 07/02/2019, 03/26/2020, 08/24/2020   PNEUMOCOCCAL CONJUGATE-20 03/17/2022   Pfizer(Comirnaty)Fall Seasonal Vaccine 12 years and older 03/10/2022, 03/21/2023   Pneumococcal Conjugate-13 08/06/2013, 12/20/2013, 05/15/2019   Pneumococcal Polysaccharide-23 05/09/2010   Pneumococcal-Unspecified 06/09/2010   Rsv, Bivalent, Protein Subunit Rsvpref,pf Verdis Frederickson) 12/20/2022   Tdap 01/03/2012, 12/23/2021   Zoster Recombinant(Shingrix) 02/09/2017, 04/13/2017   Zoster, Live 10/27/2010, 02/09/2017    Health Maintenance  Topic Date Due   Colonoscopy  07/17/2022   Medicare Annual Wellness (AWV)  04/14/2023   Lung Cancer Screening  04/14/2023 (Originally 07/07/2019)   COVID-19 Vaccine (9 - 2023-24 season) 05/16/2023   DTaP/Tdap/Td (3 - Td or Tdap) 12/24/2031   Pneumonia Vaccine 75+ Years old  Completed   INFLUENZA VACCINE  Completed   Hepatitis C Screening  Completed   Zoster Vaccines- Shingrix  Completed   HPV VACCINES  Aged Out    Discussed health benefits of physical activity, and encouraged him to engage in regular exercise appropriate for his age and condition.  Problem List Items Addressed This Visit       Cardiovascular and Mediastinum   Essential hypertension   Relevant Orders   HgB A1c (Completed)   CMP14+EGFR (Completed)   Lipid Panel With LDL/HDL Ratio (Completed)   CBC (Completed)     Respiratory   Centrilobular emphysema (HCC)    Oxygen dependent. Uses his portable and in home oxygen at night and with activities. Tolerates the oxygen well.  Stays active.       Relevant Medications   predniSONE  (DELTASONE) 5 MG tablet     Endocrine   IFG (impaired fasting glucose)   Relevant Orders   HgB A1c (Completed)   CMP14+EGFR (Completed)   Lipid Panel With LDL/HDL Ratio (Completed)   CBC (Completed)   Other Visit Diagnoses     Wellness examination    -  Primary   Encounter for immunization       Relevant Orders   Flu Vaccine Trivalent High Dose (Fluad) (Completed)   Pfizer Comirnaty Covid-19 Vaccine 9yrs & older (Completed)      Return in about 6 months (around 09/18/2023) for lungs, Hypertension. Keep up a regular exercise program and make sure you are eating a healthy diet Try to eat 4 servings of dairy a day, or if you are lactose intolerant take a calcium with vitamin D daily.  Your vaccines are up to date.  Flu vaccine and COVID-vaccine today.     Nani Gasser, MD

## 2023-03-22 LAB — HEMOGLOBIN A1C
Est. average glucose Bld gHb Est-mCnc: 128 mg/dL
Hgb A1c MFr Bld: 6.1 % — ABNORMAL HIGH (ref 4.8–5.6)

## 2023-03-22 LAB — CMP14+EGFR
ALT: 18 [IU]/L (ref 0–44)
AST: 24 [IU]/L (ref 0–40)
Albumin: 4.6 g/dL (ref 3.8–4.8)
Alkaline Phosphatase: 70 [IU]/L (ref 44–121)
BUN/Creatinine Ratio: 22 (ref 10–24)
BUN: 31 mg/dL — ABNORMAL HIGH (ref 8–27)
Bilirubin Total: 0.5 mg/dL (ref 0.0–1.2)
CO2: 23 mmol/L (ref 20–29)
Calcium: 9.2 mg/dL (ref 8.6–10.2)
Chloride: 102 mmol/L (ref 96–106)
Creatinine, Ser: 1.42 mg/dL — ABNORMAL HIGH (ref 0.76–1.27)
Globulin, Total: 2.1 g/dL (ref 1.5–4.5)
Glucose: 91 mg/dL (ref 70–99)
Potassium: 5.6 mmol/L — ABNORMAL HIGH (ref 3.5–5.2)
Sodium: 142 mmol/L (ref 134–144)
Total Protein: 6.7 g/dL (ref 6.0–8.5)
eGFR: 50 mL/min/{1.73_m2} — ABNORMAL LOW (ref >59)

## 2023-03-22 LAB — CBC
Hematocrit: 49.1 % (ref 37.5–51.0)
Hemoglobin: 15.5 g/dL (ref 13.0–17.7)
MCH: 30.5 pg (ref 26.6–33.0)
MCHC: 31.6 g/dL (ref 31.5–35.7)
MCV: 97 fL (ref 79–97)
Platelets: 229 10*3/uL (ref 150–450)
RBC: 5.08 x10E6/uL (ref 4.14–5.80)
RDW: 12.4 % (ref 11.6–15.4)
WBC: 7.6 10*3/uL (ref 3.4–10.8)

## 2023-03-22 LAB — LIPID PANEL WITH LDL/HDL RATIO
Cholesterol, Total: 152 mg/dL (ref 100–199)
HDL: 76 mg/dL (ref >39)
LDL Chol Calc (NIH): 62 mg/dL (ref 0–99)
LDL/HDL Ratio: 0.8 ratio (ref 0.0–3.6)
Triglycerides: 73 mg/dL (ref 0–149)
VLDL Cholesterol Cal: 14 mg/dL (ref 5–40)

## 2023-03-22 NOTE — Progress Notes (Signed)
Hi Zachary Waters, A1c is 6.1 in the prediabetes range.  Kidney function is still elevated around 1.4 where it was about 6 months ago.  Potassium is also still elevated not as high as it was 6 months ago but it is still high.  I would like for you to see a kidney specialist.  Would you be able to see someone at the [place] or do we need to try to refer you locally?  Cholesterol looks good and blood count looks good.

## 2023-03-23 ENCOUNTER — Telehealth: Payer: Self-pay | Admitting: Family Medicine

## 2023-03-23 DIAGNOSIS — J432 Centrilobular emphysema: Secondary | ICD-10-CM

## 2023-03-23 NOTE — Telephone Encounter (Signed)
[employer] called they're requesting Face to Face notes , Detail order for oxygen constator   For home use and supplies and testing forms  Please submit to Fax number [phone]  Phone [phone] office number

## 2023-03-24 MED ORDER — AMBULATORY NON FORMULARY MEDICATION: 0 refills | Status: AC

## 2023-03-24 NOTE — Assessment & Plan Note (Signed)
Oxygen dependent. Uses his portable and in home oxygen at night and with activities. Tolerates the oxygen well.  Stays active.

## 2023-03-24 NOTE — Telephone Encounter (Signed)
Order printed.  Not sure if they will require a walk test

## 2023-03-27 ENCOUNTER — Encounter: Payer: Self-pay | Admitting: Family Medicine

## 2023-03-27 DIAGNOSIS — R7989 Other specified abnormal findings of blood chemistry: Secondary | ICD-10-CM

## 2023-03-30 DIAGNOSIS — R7989 Other specified abnormal findings of blood chemistry: Secondary | ICD-10-CM | POA: Diagnosis not present

## 2023-03-31 ENCOUNTER — Ambulatory Visit: Payer: Medicare Other

## 2023-03-31 DIAGNOSIS — R7989 Other specified abnormal findings of blood chemistry: Secondary | ICD-10-CM

## 2023-03-31 DIAGNOSIS — N281 Cyst of kidney, acquired: Secondary | ICD-10-CM | POA: Diagnosis not present

## 2023-03-31 DIAGNOSIS — N2889 Other specified disorders of kidney and ureter: Secondary | ICD-10-CM | POA: Diagnosis not present

## 2023-04-10 ENCOUNTER — Encounter: Payer: Self-pay | Admitting: Family Medicine

## 2023-04-10 ENCOUNTER — Other Ambulatory Visit: Payer: Self-pay | Admitting: Family Medicine

## 2023-04-10 DIAGNOSIS — N179 Acute kidney failure, unspecified: Secondary | ICD-10-CM

## 2023-04-10 DIAGNOSIS — N281 Cyst of kidney, acquired: Secondary | ICD-10-CM

## 2023-04-10 DIAGNOSIS — R809 Proteinuria, unspecified: Secondary | ICD-10-CM

## 2023-04-10 DIAGNOSIS — N329 Bladder disorder, unspecified: Secondary | ICD-10-CM

## 2023-04-10 LAB — CBC WITH DIFFERENTIAL/PLATELET
Basophils Absolute: 0.1 10*3/uL (ref 0.0–0.2)
Basos: 1 %
EOS (ABSOLUTE): 0.2 10*3/uL (ref 0.0–0.4)
Eos: 2 %
Hematocrit: 47.1 % (ref 37.5–51.0)
Hemoglobin: 15.2 g/dL (ref 13.0–17.7)
Immature Grans (Abs): 0 10*3/uL (ref 0.0–0.1)
Immature Granulocytes: 0 %
Lymphocytes Absolute: 0.7 10*3/uL (ref 0.7–3.1)
Lymphs: 9 %
MCH: 31.5 pg (ref 26.6–33.0)
MCHC: 32.3 g/dL (ref 31.5–35.7)
MCV: 98 fL — ABNORMAL HIGH (ref 79–97)
Monocytes Absolute: 0.5 10*3/uL (ref 0.1–0.9)
Monocytes: 7 %
Neutrophils Absolute: 6 10*3/uL (ref 1.4–7.0)
Neutrophils: 81 %
Platelets: 231 10*3/uL (ref 150–450)
RBC: 4.83 x10E6/uL (ref 4.14–5.80)
RDW: 12.5 % (ref 11.6–15.4)
WBC: 7.4 10*3/uL (ref 3.4–10.8)

## 2023-04-10 LAB — HIV ANTIBODY (ROUTINE TESTING W REFLEX): HIV Screen 4th Generation wRfx: NONREACTIVE

## 2023-04-10 LAB — PROTEIN ELECTROPHORESIS, SERUM
A/G Ratio: 2 — ABNORMAL HIGH (ref 0.7–1.7)
Albumin ELP: 4.3 g/dL (ref 2.9–4.4)
Alpha 1: 0.2 g/dL (ref 0.0–0.4)
Alpha 2: 0.6 g/dL (ref 0.4–1.0)
Beta: 0.7 g/dL (ref 0.7–1.3)
Gamma Globulin: 0.5 g/dL (ref 0.4–1.8)
Globulin, Total: 2.1 g/dL — ABNORMAL LOW (ref 2.2–3.9)
Total Protein: 6.4 g/dL (ref 6.0–8.5)

## 2023-04-10 LAB — MICROALBUMIN / CREATININE URINE RATIO
Creatinine, Urine: 267.4 mg/dL
Microalb/Creat Ratio: 44 mg/g{creat} — ABNORMAL HIGH (ref 0–29)
Microalbumin, Urine: 116.5 ug/mL

## 2023-04-10 LAB — BASIC METABOLIC PANEL
BUN/Creatinine Ratio: 18 (ref 10–24)
BUN: 26 mg/dL (ref 8–27)
CO2: 25 mmol/L (ref 20–29)
Calcium: 9.2 mg/dL (ref 8.6–10.2)
Chloride: 103 mmol/L (ref 96–106)
Creatinine, Ser: 1.44 mg/dL — ABNORMAL HIGH (ref 0.76–1.27)
Glucose: 96 mg/dL (ref 70–99)
Potassium: 5.3 mmol/L — ABNORMAL HIGH (ref 3.5–5.2)
Sodium: 144 mmol/L (ref 134–144)
eGFR: 49 mL/min/{1.73_m2} — ABNORMAL LOW (ref >59)

## 2023-04-10 LAB — C-REACTIVE PROTEIN: CRP: <1 mg/L (ref 0–10)

## 2023-04-10 LAB — ANA: Anti Nuclear Antibody (ANA): NEGATIVE

## 2023-04-10 LAB — HEPATITIS C ANTIBODY: Hep C Virus Ab: NONREACTIVE

## 2023-04-10 LAB — SEDIMENTATION RATE: Sed Rate: 2 mm/h (ref 0–30)

## 2023-04-10 NOTE — Progress Notes (Signed)
Hi Zachary Waters, you do have just a little protein spilling into the urine.  It is not greater than 300 which is reassuring but there is a little bit there so I doing to keep an eye on that.  Blood count looks okay no sign of anemia.  The electrophoresis test was to check for multiple myeloma and so far that looks good.  Potassium level is still elevated but it does look better than it was 2 weeks ago continue to work on low potassium intake.  Negative for hepatitis C.  Negative for HIV.  Lupus test is negative.  Inflammatory markers are also negative so unlikely to be autoimmune related.

## 2023-04-10 NOTE — Progress Notes (Signed)
Plan to refer to urology to address the bladder lesion as well as exophytic cyst on the left kidney. Plan to refer to nephrology for impaired renal function and chronic medical renal disease.

## 2023-04-11 NOTE — Telephone Encounter (Signed)
Called and review results. Orders placed.

## 2023-04-18 DIAGNOSIS — R9341 Abnormal radiologic findings on diagnostic imaging of renal pelvis, ureter, or bladder: Secondary | ICD-10-CM | POA: Diagnosis not present

## 2023-04-19 ENCOUNTER — Encounter: Payer: Self-pay | Admitting: Family Medicine

## 2023-04-19 ENCOUNTER — Ambulatory Visit: Payer: Medicare Other | Admitting: Family Medicine

## 2023-04-19 VITALS — BP 128/77 | Ht 68.0 in | Wt 173.0 lb

## 2023-04-19 DIAGNOSIS — Z0000 Encounter for general adult medical examination without abnormal findings: Secondary | ICD-10-CM

## 2023-04-19 NOTE — Progress Notes (Signed)
Subjective:   Zachary Waters is a 82 y.o. male who presents for Medicare Annual/Subsequent preventive examination.  Visit Complete: Virtual I connected with  Rhoderick Kormos Schwandt on 04/19/23 by a audio enabled telemedicine application and verified that I am speaking with the correct person using two identifiers.  Patient Location: Home  Provider Location: Office/Clinic  I discussed the limitations of evaluation and management by telemedicine. The patient expressed understanding and agreed to proceed.  Vital Signs: Because this visit was a virtual/telehealth visit, some criteria may be missing or patient reported. Any vitals not documented were not able to be obtained and vitals that have been documented are patient reported.  Patient Medicare AWV questionnaire was completed by the patient on 04/19/23; I have confirmed that all information answered by patient is correct and no changes since this date.  Cardiac Risk Factors include: advanced age (>34men, >77 women);hypertension;male gender;dyslipidemia     Objective:    Today's Vitals   04/19/23 0857  BP: 128/77  Weight: 173 lb (78.5 kg)  Height: 5\' 8"  (1.727 m)   Body mass index is 26.3 kg/m.     04/19/2023    9:25 AM 04/13/2022    9:05 AM 03/17/2022    9:16 AM 12/11/2020   10:26 AM 03/10/2020    9:21 AM 02/12/2019    9:22 AM 02/06/2018    8:37 AM  Advanced Directives  Does Patient Have a Medical Advance Directive? Yes Yes Yes Yes Yes Yes Yes  Type of Estate agent of [place];Living will Living will;Healthcare Power of [employer] Power of [place];Living will Living will;Healthcare Power of Attorney Living will;Healthcare Power of [employer] Power of [place];Living will Living will;Healthcare Power of Attorney  Does patient want to make changes to medical advance directive? No - Patient declined  No - Patient declined No - Patient declined  No - Patient declined   Copy of Healthcare  Power of Attorney in Chart?  Yes - validated most recent copy scanned in chart (See row information) Yes - validated most recent copy scanned in chart (See row information) No - copy requested No - copy requested No - copy requested No - copy requested    Current Medications (verified) Outpatient Encounter Medications as of 04/19/2023  Medication Sig   albuterol (PROVENTIL) (5 MG/ML) 0.5% nebulizer solution Take 2.5 mg by nebulization every 6 (six) hours as needed.   albuterol (VENTOLIN HFA) 108 (90 Base) MCG/ACT inhaler Inhale 2 puffs into the lungs every 6 (six) hours as needed.   AMBULATORY NON FORMULARY MEDICATION Medication Name: Portable oxygen concentrator set 2 Liters, for home use and with supplies.   arformoterol (BROVANA) 15 MCG/2ML NEBU Take 15 mcg by nebulization 2 (two) times daily.   Ascorbic Acid (VITAMIN C) 1000 MG tablet Take 2,000 mg by mouth daily.   aspirin EC 81 MG tablet Take 81 mg by mouth daily.   atorvastatin (LIPITOR) 80 MG tablet Take 80 mg by mouth daily.   azithromycin (ZITHROMAX) 250 MG tablet Take 250 mg by mouth every other day.   budesonide (PULMICORT) 180 MCG/ACT inhaler Inhale 2 puffs into the lungs 2 (two) times daily.   ipratropium (ATROVENT HFA) 17 MCG/ACT inhaler Inhale 2 puffs into the lungs 3 (three) times daily.   levothyroxine (SYNTHROID) 112 MCG tablet Take 1 tablet (112 mcg total) by mouth daily before breakfast.   lisinopril (ZESTRIL) 20 MG tablet Take 20 mg by mouth daily.   Omega-3 Fatty Acids (FISH OIL) 1000 MG  CAPS Take 1 capsule by mouth daily.   predniSONE (DELTASONE) 5 MG tablet Take 5 mg by mouth.   QUERCETIN PO Take 1 tablet by mouth daily. Pt reports taking 1 tablet daily   No facility-administered encounter medications on file as of 04/19/2023.    Allergies (verified) Patient has no known allergies.   History: Past Medical History:  Diagnosis Date   Cervical spondylosis with radiculopathy 03/08/2012   Cervical spondylosis with  radiculopathy    COPD (chronic obstructive pulmonary disease) (HCC)    COPD, group D, by GOLD 2017 classification (HCC) 10/20/2010   Dyspnea    Essential hypertension    GERD (gastroesophageal reflux disease) once in a great while   Heart murmur I have a defibulator   Hyperlipidemia 02/02/2018   Pneumonia 10/2009   Pneumonia 11/24/2020   Thyroid disease    Past Surgical History:  Procedure Laterality Date   CARDIAC CATHETERIZATION  11/30/2020   COLONOSCOPY     HERNIA REPAIR     inguinal hernia repair on right and left   INGUINAL HERNIA REPAIR Bilateral 04/21/2016   Procedure: LAPAROSCOPIC LEFT INGUINAL HERNIA REPAIR AND RIGHT FEMORAL HERNIA REPAIR;  Surgeon: Karie Soda, MD;  Location: [facility] OR;  Service: General;  Laterality: Bilateral;   INSERTION OF MESH Bilateral 04/21/2016   Procedure: INSERTION OF MESH LEFT INGUINAL HERNIA  AND INSERTION OF MESH RIGHT FEMORAL HERNIA;  Surgeon: Karie Soda, MD;  Location: [facility] OR;  Service: General;  Laterality: Bilateral;   LEG SURGERY Right    tumor removed as a child from femur?   ROTATOR CUFF REPAIR  2005   Left   TONSILLECTOMY     Family History  Problem Relation Age of Onset   Prostate cancer Father 11   Heart disease Mother 73       deceased age 58   Depression Brother        Commited suicide   Depression Brother    Social History   Socioeconomic History   Marital status: Married    Spouse name: Hilda Lias   Number of children: 2   Years of education: 14   Highest education level: Associate degree: academic program  Occupational History   Occupation: retired    Comment: Arts administrator  Tobacco Use   Smoking status: Former    Types: Pipe    Quit date: 10/07/2009    Years since quitting: 13.5   Smokeless tobacco: Never  Vaping Use   Vaping status: Never Used  Substance and Sexual Activity   Alcohol use: Yes    Alcohol/week: 2.0 standard drinks of alcohol    Types: 1 Glasses of wine, 1 Cans of beer per week    Comment:  occasional   Drug use: No   Sexual activity: Never  Other Topics Concern   Not on file  Social History Narrative   Lives with his wife. He has two children. He is recovering from pneumonia and a recent cardiac cath. And is recovering and is not able to do much exercise or play golf.    Social Determinants of Health   Financial Resource Strain: Low Risk  (04/19/2023)   Overall Financial Resource Strain (CARDIA)    Difficulty of Paying Living Expenses: Not hard at all  Food Insecurity: No Food Insecurity (04/19/2023)   Hunger Vital Sign    Worried About Running Out of Food in the Last Year: Never true    Ran Out of Food in the Last Year: Never true  Transportation Needs:  No Transportation Needs (04/19/2023)   PRAPARE - Administrator, Civil Service (Medical): No    Lack of Transportation (Non-Medical): No  Physical Activity: Inactive (04/19/2023)   Exercise Vital Sign    Days of Exercise per Week: 0 days    Minutes of Exercise per Session: 0 min  Stress: No Stress Concern Present (04/19/2023)   [employer] of Occupational Health - Occupational Stress Questionnaire    Feeling of Stress : Not at all  Social Connections: Moderately Integrated (04/19/2023)   Social Connection and Isolation Panel [NHANES]    Frequency of Communication with Friends and Family: Once a week    Frequency of Social Gatherings with Friends and Family: Never    Attends Religious Services: More than 4 times per year    Active Member of [employer] or Organizations: Yes    Attends Engineer, structural: More than 4 times per year    Marital Status: Married    Tobacco Counseling Counseling given: Not Answered   Clinical Intake:  Pre-visit preparation completed: No  Pain : No/denies pain     BMI - recorded: 26.3 Nutritional Status: BMI 25 -29 Overweight Nutritional Risks: None Diabetes: No  How often do you need to have someone help you when you read instructions, pamphlets, or  other written materials from your doctor or pharmacy?: 1 - Never What is the last grade level you completed in school?: 14  Interpreter Needed?: No      Activities of Daily Living    04/19/2023    9:00 AM 04/15/2023   11:16 AM  In your present state of health, do you have any difficulty performing the following activities:  Hearing? 1 1  Comment wears hearing aids   Vision? 0 0  Difficulty concentrating or making decisions? 0 0  Walking or climbing stairs? 1 1  Dressing or bathing? 0 0  Doing errands, shopping? 0 0  Preparing Food and eating ? N N  Using the Toilet? N N  In the past six months, have you accidently leaked urine? N N  Do you have problems with loss of bowel control? N N  Managing your Medications? N N  Managing your Finances? N N  Housekeeping or managing your Housekeeping? N N    Patient Care Team: Agapito Games, MD as PCP - General (Family Medicine) Jenne Pane (Pulmonary Disease) Wyline Mood (Chiropractic Medicine) Gabriel Carina, [facility] (Inactive) as Pharmacist (Pharmacist) Gabriel Carina, [facility] (Inactive) (Pharmacist) Dotson, Dr. [facility] [place], Dr. Cardiologist  Mayme Genta, Casimiro Needle, Cardiologist, pacer maker  Drucie Opitz, Dr. Evalee Jefferson     Indicate any recent Medical Services you may have received from other than [facility] providers in the past year (date may be approximate).     Assessment:   This is a routine wellness examination for [facility].  Hearing/Vision screen Hearing Screening - Comments:: Unable to test, wears hearing aid Vision Screening - Comments:: Unable to test, wears reading glasses.    Goals Addressed             This Visit's Progress    Disease Progression Prevented or Minimized       Evidence-based guidance:   Prepare patient for laboratory and diagnostic exams based on risk and presentation.  Encourage lifestyle changes, such as increased intake of plant-based foods, stress reduction, consistent physical  activity and smoking cessation to prevent long-term complications and chronic disease.   Individualize activity and exercise recommendations while considering potential limitations, such  as neuropathy, retinopathy or the ability to prevent hyperglycemia or hypoglycemia.   Assess signs/symptoms and risk factors for hypertension, sleep-disordered breathing, neuropathy (including changes in gait and balance), retinopathy, nephropathy and sexual dysfunction.  Prepare patient for use of pharmacologic therapy that may include antihypertensive, analgesic, statin after 80 years of age with periodic adjustments, based on presenting chronic condition and laboratory results.   Address pregnancy planning and contraceptive choice, especially when prescribing antihypertensive or statin.  Ensure completion of annual comprehensive foot exam and dilated eye exam beginning at puberty or 80 years of age (whichever is earlier) once the child has been diabetic for 5 years.  Implement additional individualized goals and interventions based on identified risk factors.  Prepare patient for consultation or referral for specialist care, such as ophthalmology, neurology, cardiology, podiatry, nephrology or perinatology.    Notes: COPD remain stable       Depression Screen    04/19/2023    9:24 AM 03/21/2023    9:29 AM 09/05/2022    2:35 PM 07/12/2022    1:19 PM 04/13/2022    9:09 AM 03/10/2022   11:09 AM 12/23/2021    8:05 AM  PHQ 2/9 Scores  PHQ - 2 Score 0 0 0 0 0 0 0    Fall Risk    04/19/2023    9:27 AM 04/15/2023   11:16 AM 03/21/2023    9:29 AM 09/05/2022    2:35 PM 07/12/2022    1:19 PM  Fall Risk   Falls in the past year? 0 0 0 0   Number falls in past yr: 0 0 0 0 0  Injury with Fall? 0 0 0 0 0  Risk for fall due to : No Fall Risks  No Fall Risks No Fall Risks No Fall Risks  Follow up   Falls evaluation completed Falls evaluation completed Falls evaluation completed    MEDICARE RISK AT HOME: Medicare  Risk at Home Any stairs in or around the home?: Yes If so, are there any without handrails?: Yes Home free of loose throw rugs in walkways, pet beds, electrical cords, etc?: Yes Adequate lighting in your home to reduce risk of falls?: Yes Life alert?: No Use of a cane, walker or w/c?: No Grab bars in the bathroom?: Yes Shower chair or bench in shower?: No Elevated toilet seat or a handicapped toilet?: Yes  TIMED UP AND GO:  Was the test performed?  No    Cognitive Function:        04/19/2023    9:30 AM 03/21/2023    9:29 AM 04/13/2022    9:10 AM 03/17/2022    9:15 AM 12/11/2020   10:30 AM  6CIT Screen  What Year? 0 points 0 points 0 points 0 points 0 points  What month? 0 points 0 points 0 points 0 points 0 points  What time? 0 points 0 points 0 points 0 points 0 points  Count back from 20 0 points 0 points 0 points 0 points 0 points  Months in reverse 0 points 0 points 0 points 0 points 0 points  Repeat phrase 0 points 0 points 0 points 0 points 0 points  Total Score 0 points 0 points 0 points 0 points 0 points    Immunizations Immunization History  Administered Date(s) Administered   Fluad Quad(high Dose 65+) 02/12/2020, 02/24/2021   Fluad Trivalent(High Dose 65+) 03/21/2023   Influenza Inj Mdck Quad Pf 03/01/2022   Influenza Split 02/06/2013   Influenza, High Dose  Seasonal PF 01/24/2012, 02/04/2016, 02/02/2017, 02/06/2018, 02/05/2019   Influenza,inj,Quad PF,6+ Mos 02/12/2013, 01/09/2014, 01/13/2015, 02/07/2016   Moderna Covid-19 Vaccine Bivalent Booster 45yrs & up 02/24/2021   Moderna Sars-Covid-2 Vaccination 05/30/2019, 06/11/2019, 07/02/2019, 03/26/2020, 08/24/2020   PNEUMOCOCCAL CONJUGATE-20 03/17/2022   Pfizer(Comirnaty)Fall Seasonal Vaccine 12 years and older 03/10/2022, 03/21/2023   Pneumococcal Conjugate-13 08/06/2013, 12/20/2013, 05/15/2019   Pneumococcal Polysaccharide-23 05/09/2010   Pneumococcal-Unspecified 06/09/2010   Rsv, Bivalent, Protein Subunit  Rsvpref,pf Verdis Frederickson) 12/20/2022   Tdap 01/03/2012, 12/23/2021   Zoster Recombinant(Shingrix) 02/09/2017, 04/13/2017   Zoster, Live 10/27/2010, 02/09/2017    TDAP status: Up to date  Flu Vaccine status: Up to date  Pneumococcal vaccine status: Up to date  Covid-19 vaccine status: Completed vaccines  Qualifies for Shingles Vaccine? Yes   Zostavax completed Yes   Shingrix Completed?: Yes  Screening Tests Health Maintenance  Topic Date Due   Lung Cancer Screening  07/07/2019   Colonoscopy  07/17/2022   COVID-19 Vaccine (9 - 2023-24 season) 05/16/2023   Medicare Annual Wellness (AWV)  04/18/2024   DTaP/Tdap/Td (3 - Td or Tdap) 12/24/2031   Pneumonia Vaccine 51+ Years old  Completed   INFLUENZA VACCINE  Completed   Zoster Vaccines- Shingrix  Completed   HPV VACCINES  Aged Out   Hepatitis C Screening  Discontinued    Health Maintenance  Health Maintenance Due  Topic Date Due   Lung Cancer Screening  07/07/2019   Colonoscopy  07/17/2022    Colorectal cancer screening: Type of screening: Colonoscopy. Completed 07/17/19. Repeat every aged out  years  Lung Cancer Screening: (Low Dose CT Chest recommended if Age 52-80 years, 20 pack-year currently smoking OR have quit w/in 15years.) does not qualify. "I've already gone past that".   Lung Cancer Screening Referral: does not want   Additional Screening:  Hepatitis C Screening: does not qualify; Completed n/a   Vision Screening: Recommended annual ophthalmology exams for early detection of glaucoma and other disorders of the eye. Is the patient up to date with their annual eye exam?  Yes  Who is the provider or what [facility] the [facility] the [facility]  If pt is not established with a provider, would they like to be referred to a provider to establish care? No .   Dental Screening: Recommended annual dental exams for proper oral hygiene  Diabetic Foot Exam: n/a   Community  Resource Referral / Chronic Care Management: CRR required this visit?  No   CCM required this visit?  No     Plan:     I have personally reviewed and noted the following in the patient's chart:   Medical and social history Use of alcohol, tobacco or illicit drugs  Current medications and supplements including opioid prescriptions. Patient is not currently taking opioid prescriptions. Functional ability and status Nutritional status Physical activity Advanced directives List of other physicians Hospitalizations, surgeries, and ER visits in previous 12 months: 06/26/22 admitted for viral infection of lung.  Vitals Screenings to include cognitive, depression, and falls Referrals and appointments: none today   In addition, I have reviewed and discussed with patient certain preventive protocols, quality metrics, and best practice recommendations. A written personalized care plan for preventive services as well as general preventive health recommendations were provided to patient.     Novella Olive, FNP   04/19/2023   After Visit Summary: (MyChart) Due to this being a telephonic visit, the after visit summary with patients personalized  plan was offered to patient via MyChart   Follow-up with PCP as scheduled.  Medication list updated to reflect pulmonary inhalers.

## 2023-05-30 DIAGNOSIS — Z95810 Presence of automatic (implantable) cardiac defibrillator: Secondary | ICD-10-CM | POA: Diagnosis not present

## 2023-05-30 DIAGNOSIS — I472 Ventricular tachycardia, unspecified: Secondary | ICD-10-CM | POA: Diagnosis not present

## 2023-05-31 DIAGNOSIS — J449 Chronic obstructive pulmonary disease, unspecified: Secondary | ICD-10-CM | POA: Diagnosis not present

## 2023-05-31 DIAGNOSIS — R918 Other nonspecific abnormal finding of lung field: Secondary | ICD-10-CM | POA: Diagnosis not present

## 2023-05-31 DIAGNOSIS — Z87891 Personal history of nicotine dependence: Secondary | ICD-10-CM | POA: Diagnosis not present

## 2023-05-31 DIAGNOSIS — J9611 Chronic respiratory failure with hypoxia: Secondary | ICD-10-CM | POA: Diagnosis not present

## 2023-06-06 DIAGNOSIS — R9341 Abnormal radiologic findings on diagnostic imaging of renal pelvis, ureter, or bladder: Secondary | ICD-10-CM | POA: Diagnosis not present

## 2023-06-07 ENCOUNTER — Encounter: Payer: Self-pay | Admitting: Family Medicine

## 2023-06-19 ENCOUNTER — Ambulatory Visit: Payer: Self-pay | Admitting: Family Medicine

## 2023-06-19 NOTE — Telephone Encounter (Signed)
  Chief Complaint: sinus congestion Symptoms: sinus congestion, nasal drainage, SOB with activity Frequency: 5 days Pertinent Negatives: Patient denies CP, SOB with rest, fever Disposition: [] ED /[] Urgent Care (no appt availability in office) / [x] Appointment(In office/virtual)/ []  [facility]/ [] Home Care/ [] Refused Recommended Disposition /[] [facility]/ []  Follow-up with PCP Additional Notes: Patient calls reporting sinus congestion x 5 days. Patient states he has tried nasal washes and OTC meds with minimal relief. Per protocol, patient to be evaluated within 3 days. First available appointment with PCP 06/20/23 @ 1300. Patient scheduled with provider Care advice reviewed, patient verbalized understandingand denies further questions at this time. Alerting PCP for review.    Copied from CRM [id number]. Topic: Clinical - Red Word Triage >> Jun 19, 2023 10:18 AM Elle L wrote: Red Word that prompted transfer to Nurse Triage: The patient has congestion that is making him have difficulty breathing. Reason for Disposition  [1] Using nasal washes and pain medicine > 24 hours AND [2] sinus pain (around cheekbone or eye) persists  Answer Assessment - Initial Assessment Questions 1. LOCATION: "Where does it hurt?"      Denies pain, states very congested 2. ONSET: "When did the sinus pain start?"  (e.g., hours, days)      5 days 3. SEVERITY: "How bad is the pain?"   (Scale 1-10; mild, moderate or severe)   - MILD (1-3): doesn't interfere with normal activities    - MODERATE (4-7): interferes with normal activities (e.g., work or school) or awakens from sleep   - SEVERE (8-10): excruciating pain and patient unable to do any normal activities        Denies 4. RECURRENT SYMPTOM: "Have you ever had sinus problems before?" If Yes, ask: "When was the last time?" and "What happened that time?"      Annually, last year 5. NASAL CONGESTION: "Is the nose blocked?" If Yes, ask: "Can you  open it or must you breathe through your mouth?"     Yes, very stuffy. Has done nasal washes with minimal relief 6. NASAL DISCHARGE: "Do you have discharge from your nose?" If so ask, "What color?"     Running intermittently, denies color. 7. FEVER: "Do you have a fever?" If Yes, ask: "What is it, how was it measured, and when did it start?"      Denies 8. OTHER SYMPTOMS: "Do you have any other symptoms?" (e.g., sore throat, cough, earache, difficulty breathing)     SOB with activity  Protocols used: Sinus Pain or Congestion-A-AH

## 2023-06-20 ENCOUNTER — Encounter: Payer: Self-pay | Admitting: Family Medicine

## 2023-06-20 ENCOUNTER — Ambulatory Visit (INDEPENDENT_AMBULATORY_CARE_PROVIDER_SITE_OTHER): Payer: Medicare Other | Admitting: Family Medicine

## 2023-06-20 VITALS — BP 132/57 | HR 98 | Ht 68.0 in | Wt 175.0 lb

## 2023-06-20 DIAGNOSIS — J441 Chronic obstructive pulmonary disease with (acute) exacerbation: Secondary | ICD-10-CM

## 2023-06-20 DIAGNOSIS — J432 Centrilobular emphysema: Secondary | ICD-10-CM

## 2023-06-20 MED ORDER — DOXYCYCLINE HYCLATE 100 MG PO TABS: 100.0000 mg | ORAL_TABLET | Freq: Two times a day (BID) | ORAL | 0 refills | Status: DC

## 2023-06-20 MED ORDER — PREDNISONE 20 MG PO TABS: ORAL_TABLET | ORAL | 0 refills | Status: DC

## 2023-06-20 NOTE — Progress Notes (Signed)
Acute Office Visit  Subjective:     Patient ID: Zachary Waters, male    DOB: December 04, 1940, 83 y.o.   MRN: [id number]  Chief Complaint  Patient presents with   Sinusitis    HPI Patient is in today for sinus pressure cough congestion and shortness of breath for about 8 days.  He is getting productive sputum.  No fevers or chills.  He did test for COVID at home and it was negative.  He has been using his nebulizer and wearing his oxygen more often.  He has underlying COPD.  So wanted to catch me up on his visits with urology.  He has a large bladder stone and he is scheduled in March to have it blasted.  He does have an upcoming appoint with nephrology this Thursday.  ROS      Objective:    BP (!) 132/57   Pulse 98   Ht 5\' 8"  (1.727 m)   Wt 175 lb (79.4 kg)   SpO2 99%   BMI 26.61 kg/m    Physical Exam Constitutional:      Appearance: Normal appearance.  HENT:     Head: Normocephalic and atraumatic.     Right Ear: Tympanic membrane, ear canal and external ear normal. There is no impacted cerumen.     Left Ear: Tympanic membrane, ear canal and external ear normal. There is no impacted cerumen.     Nose: Nose normal.     Mouth/Throat:     Pharynx: Oropharynx is clear.  Eyes:     Conjunctiva/sclera: Conjunctivae normal.  Cardiovascular:     Rate and Rhythm: Normal rate and regular rhythm.  Pulmonary:     Effort: Pulmonary effort is normal.     Breath sounds: Normal breath sounds.  Abdominal:     Comments: Diffuse coarse rhonchi and wheezing.  Musculoskeletal:     Cervical back: Neck supple. No tenderness.  Lymphadenopathy:     Cervical: No cervical adenopathy.  Skin:    General: Skin is warm and dry.  Neurological:     Mental Status: He is alert and oriented to person, place, and time.  Psychiatric:        Mood and Affect: Mood normal.     No results found for any visits on 06/20/23.      Assessment & Plan:   Problem List Items Addressed This Visit        Respiratory   Centrilobular emphysema (HCC) - Primary   Relevant Medications   predniSONE (DELTASONE) 20 MG tablet   Other Visit Diagnoses       COPD exacerbation (HCC)       Relevant Medications   predniSONE (DELTASONE) 20 MG tablet      PD exacerbation will treat with Doxy and prednisone he is actually on 5 months since his last exacerbation which is actually really fantastic.  If he is not feeling better in a few days or if he feels like he is getting worse call back and we will get a chest x-ray if needed.  But I do not hear any localized sign of infection it sounds more diffuse.  Meds ordered this encounter  Medications   doxycycline (VIBRA-TABS) 100 MG tablet    Sig: Take 1 tablet (100 mg total) by mouth 2 (two) times daily.    Dispense:  14 tablet    Refill:  0   predniSONE (DELTASONE) 20 MG tablet    Sig: Take 2 tablets (40 mg total) by  mouth daily with breakfast for 5 days, THEN 1 tablet (20 mg total) daily with breakfast for 5 days.    Dispense:  15 tablet    Refill:  0    No follow-ups on file.  Nani Gasser, MD

## 2023-06-20 NOTE — Telephone Encounter (Signed)
Patient seen in office today at 1pm

## 2023-06-21 ENCOUNTER — Ambulatory Visit: Payer: Self-pay | Admitting: Family Medicine

## 2023-06-21 DIAGNOSIS — N400 Enlarged prostate without lower urinary tract symptoms: Secondary | ICD-10-CM | POA: Insufficient documentation

## 2023-06-21 NOTE — Telephone Encounter (Signed)
  Patient calling regarding a message he sent Dr. Linford Arnold via Mychart yesterday.  Zachary Waters to [facility] (supporting Ag[employer]ito Games, MD)      06/20/23  6:26 PM Hi Dr. Linford Arnold:-)!! Before I take the Doxycycline medication in the morning I just remembered one of the meds I'm currently taking you said I cannot take with the Doxycycline. Is it Lisinopril or Azithpomycin? I thought it was the Azithpomycin 250MG  which I get from the [place] for very severe COPD. Thank you, Zachary Waters   Patient states he has not taken his Lisinopril or Azithromycin today because he was unsure of which medication he could not take with Doxycycline. No information listed on patient visit from yesterday. Patient requesting call back from Provider.   Copied From CRM [id number]. Reason for Triage: Questions regarding drug interaction. Please advise   Reason for Disposition  [1] Caller has NON-URGENT medicine question about med that PCP prescribed AND [2] triager unable to answer question  Answer Assessment - Initial Assessment Questions 1. NAME of MEDICINE: "What medicine(s) are you calling about?"     Doxycycline, Lisinopril and Azithromycin  2. QUESTION: "What is your question?" (e.g., double dose of medicine, side effect)     Patient calling regarding a message he sent Dr. Linford Arnold via Mychart yesterday.  Zachary Lone Waters to [facility] (supporting Ag[employer]ito Games, MD)       06/20/23  6:26 PM Hi Dr. Linford Arnold:-)!! Before I take the Doxycycline medication in the morning I just remembered one of the meds I'm currently taking you said I cannot take with the Doxycycline. Is it Lisinopril or Azithpomycin? I thought it was the Azithpomycin 250MG  which I get from the [place] for very severe COPD. Thank you, Zachary Waters   Patient states he has not taken his Lisinopril or Azithromycin today because he was unsure of which medication he could not take with Doxycycline. No  information listed on patient visit from yesterday. Requesting call back from Provider.   3. PRESCRIBER: "Who prescribed the medicine?" Reason: if prescribed by specialist, call should be referred to that group.     Dr Linford Arnold  Protocols used: Medication Question Call-A-AH

## 2023-06-22 DIAGNOSIS — R7303 Prediabetes: Secondary | ICD-10-CM | POA: Diagnosis not present

## 2023-06-22 DIAGNOSIS — N1831 Chronic kidney disease, stage 3a: Secondary | ICD-10-CM | POA: Diagnosis not present

## 2023-06-22 DIAGNOSIS — E875 Hyperkalemia: Secondary | ICD-10-CM | POA: Diagnosis not present

## 2023-06-22 DIAGNOSIS — N329 Bladder disorder, unspecified: Secondary | ICD-10-CM | POA: Diagnosis not present

## 2023-06-23 ENCOUNTER — Encounter: Payer: Self-pay | Admitting: Family Medicine

## 2023-06-25 ENCOUNTER — Encounter: Payer: Self-pay | Admitting: Family Medicine

## 2023-06-25 DIAGNOSIS — J441 Chronic obstructive pulmonary disease with (acute) exacerbation: Secondary | ICD-10-CM

## 2023-06-26 ENCOUNTER — Ambulatory Visit (INDEPENDENT_AMBULATORY_CARE_PROVIDER_SITE_OTHER): Payer: Medicare Other

## 2023-06-26 ENCOUNTER — Other Ambulatory Visit: Payer: Self-pay | Admitting: Family Medicine

## 2023-06-26 DIAGNOSIS — R0602 Shortness of breath: Secondary | ICD-10-CM | POA: Diagnosis not present

## 2023-06-26 DIAGNOSIS — J449 Chronic obstructive pulmonary disease, unspecified: Secondary | ICD-10-CM | POA: Diagnosis not present

## 2023-06-26 DIAGNOSIS — J441 Chronic obstructive pulmonary disease with (acute) exacerbation: Secondary | ICD-10-CM

## 2023-06-26 DIAGNOSIS — Z955 Presence of coronary angioplasty implant and graft: Secondary | ICD-10-CM | POA: Diagnosis not present

## 2023-06-26 DIAGNOSIS — Z95828 Presence of other vascular implants and grafts: Secondary | ICD-10-CM | POA: Diagnosis not present

## 2023-06-26 MED ORDER — CEFDINIR 300 MG PO CAPS: 300.0000 mg | ORAL_CAPSULE | Freq: Two times a day (BID) | ORAL | 0 refills | Status: DC

## 2023-06-26 NOTE — Progress Notes (Signed)
Orders Placed This Encounter  Procedures   DG Chest 2 View    Standing Status:   Future    Expiration Date:   06/25/2024    Reason for Exam (SYMPTOM  OR DIAGNOSIS REQUIRED):   cough, SOB    Preferred imaging location?:   [facility]

## 2023-06-27 ENCOUNTER — Encounter: Payer: Self-pay | Admitting: Family Medicine

## 2023-06-27 NOTE — Progress Notes (Signed)
Hi Tom, final read on the chest x-ray by the radiologist shows no acute pneumonia or findings.  Were you able to pick up the new antibiotic last night?

## 2023-07-03 DIAGNOSIS — I2510 Atherosclerotic heart disease of native coronary artery without angina pectoris: Secondary | ICD-10-CM | POA: Diagnosis not present

## 2023-07-04 DIAGNOSIS — Z01812 Encounter for preprocedural laboratory examination: Secondary | ICD-10-CM | POA: Diagnosis not present

## 2023-07-04 DIAGNOSIS — R799 Abnormal finding of blood chemistry, unspecified: Secondary | ICD-10-CM | POA: Diagnosis not present

## 2023-07-13 DIAGNOSIS — Z87891 Personal history of nicotine dependence: Secondary | ICD-10-CM | POA: Diagnosis not present

## 2023-07-13 DIAGNOSIS — Z7982 Long term (current) use of aspirin: Secondary | ICD-10-CM | POA: Diagnosis not present

## 2023-07-13 DIAGNOSIS — I252 Old myocardial infarction: Secondary | ICD-10-CM | POA: Diagnosis not present

## 2023-07-13 DIAGNOSIS — N210 Calculus in bladder: Secondary | ICD-10-CM | POA: Diagnosis not present

## 2023-07-13 DIAGNOSIS — E039 Hypothyroidism, unspecified: Secondary | ICD-10-CM | POA: Diagnosis not present

## 2023-07-13 DIAGNOSIS — I10 Essential (primary) hypertension: Secondary | ICD-10-CM | POA: Diagnosis not present

## 2023-07-13 DIAGNOSIS — N1831 Chronic kidney disease, stage 3a: Secondary | ICD-10-CM | POA: Diagnosis not present

## 2023-07-13 DIAGNOSIS — J449 Chronic obstructive pulmonary disease, unspecified: Secondary | ICD-10-CM | POA: Diagnosis not present

## 2023-07-13 DIAGNOSIS — I2510 Atherosclerotic heart disease of native coronary artery without angina pectoris: Secondary | ICD-10-CM | POA: Diagnosis not present

## 2023-07-13 DIAGNOSIS — R7303 Prediabetes: Secondary | ICD-10-CM | POA: Diagnosis not present

## 2023-07-13 DIAGNOSIS — Z79899 Other long term (current) drug therapy: Secondary | ICD-10-CM | POA: Diagnosis not present

## 2023-07-13 DIAGNOSIS — E785 Hyperlipidemia, unspecified: Secondary | ICD-10-CM | POA: Diagnosis not present

## 2023-07-13 DIAGNOSIS — I129 Hypertensive chronic kidney disease with stage 1 through stage 4 chronic kidney disease, or unspecified chronic kidney disease: Secondary | ICD-10-CM | POA: Diagnosis not present

## 2023-07-14 DIAGNOSIS — R339 Retention of urine, unspecified: Secondary | ICD-10-CM | POA: Diagnosis not present

## 2023-07-17 DIAGNOSIS — Z466 Encounter for fitting and adjustment of urinary device: Secondary | ICD-10-CM | POA: Diagnosis not present

## 2023-07-18 ENCOUNTER — Other Ambulatory Visit: Payer: Self-pay | Admitting: Family Medicine

## 2023-07-24 DIAGNOSIS — R339 Retention of urine, unspecified: Secondary | ICD-10-CM | POA: Diagnosis not present

## 2023-08-03 DIAGNOSIS — N1831 Chronic kidney disease, stage 3a: Secondary | ICD-10-CM | POA: Diagnosis not present

## 2023-08-03 DIAGNOSIS — N210 Calculus in bladder: Secondary | ICD-10-CM | POA: Diagnosis not present

## 2023-08-03 DIAGNOSIS — I10 Essential (primary) hypertension: Secondary | ICD-10-CM | POA: Diagnosis not present

## 2023-08-04 LAB — BASIC METABOLIC PANEL WITH GFR
BUN: 18 (ref 4–21)
CO2: 24 — AB (ref 13–22)
Chloride: 103 (ref 99–108)
Creatinine: 1.4 — AB (ref 0.6–1.3)
Potassium: 5.3 meq/L — AB (ref 3.5–5.1)
Sodium: 141 (ref 137–147)

## 2023-08-04 LAB — COMPREHENSIVE METABOLIC PANEL WITH GFR
Calcium: 9.3 (ref 8.7–10.7)
eGFR: 53

## 2023-08-15 ENCOUNTER — Ambulatory Visit: Payer: Self-pay | Admitting: *Deleted

## 2023-08-15 NOTE — Telephone Encounter (Signed)
  Chief Complaint: worsening cough and SOB with coughing. Hx COPD  Symptoms: cough dry , SOB with coughing hx COPD  Frequency: an  Pertinent Negatives: Patient denies chest pain or difficulty breathing now . No fever.  Disposition: [] ED /[] Urgent Care (no appt availability in office) / [x] Appointment(In office/virtual)/ []  [facility]/ [] Home Care/ [] Refused Recommended Disposition /[]  Mobile Bus/ []  Follow-up with PCP Additional Notes:   Patient only wants to see PCP. Appt scheduled for 08/17/23. Recommended if sx worsen call back or go be seen more urgently .    Copied from CRM [id number]. Topic: Clinical - Red Word Triage >> Aug 15, 2023 10:42 AM Everette Rank wrote: Red Word that prompted transfer to Nurse Triage:  COPD issue/Cough started on 04/07 been getting worse last 1-2 days Reason for Disposition  [1] Continuous (nonstop) coughing interferes with work or school AND [2] no improvement using cough treatment per Care Advice    Hx COPD  Answer Assessment - Initial Assessment Questions 1. ONSET: "When did the cough begin?"      cough 2. SEVERITY: "How bad is the cough today?"      Worsening  3. SPUTUM: "Describe the color of your sputum" (none, dry cough; clear, [place], yellow, green)     Dry  4. HEMOPTYSIS: "Are you coughing up any blood?" If so ask: "How much?" (flecks, streaks, tablespoons, etc.)     na 5. DIFFICULTY BREATHING: "Are you having difficulty breathing?" If Yes, ask: "How bad is it?" (e.g., mild, moderate, severe)    - MILD: No SOB at rest, mild SOB with walking, speaks normally in sentences, can lie down, no retractions, pulse < 100.    - MODERATE: SOB at rest, SOB with minimal exertion and prefers to sit, cannot lie down flat, speaks in phrases, mild retractions, audible wheezing, pulse 100-120.    - SEVERE: Very SOB at rest, speaks in single words, struggling to breathe, sitting hunched forward, retractions, pulse > 120      SOB with coughing  6.  FEVER: "Do you have a fever?" If Yes, ask: "What is your temperature, how was it measured, and when did it start?"     na 7. CARDIAC HISTORY: "Do you have any history of heart disease?" (e.g., heart attack, congestive heart failure)      Hx  8. LUNG HISTORY: "Do you have any history of lung disease?"  (e.g., pulmonary embolus, asthma, emphysema)     Hx COPD  9. PE RISK FACTORS: "Do you have a history of blood clots?" (or: recent major surgery, recent prolonged travel, bedridden)     na 10. OTHER SYMPTOMS: "Do you have any other symptoms?" (e.g., runny nose, wheezing, chest pain)       Cough SOB with coughing hx COPD  11. PREGNANCY: "Is there any chance you are pregnant?" "When was your last menstrual period?"       na 12. TRAVEL: "Have you traveled out of the country in the last month?" (e.g., travel history, exposures)       na  Protocols used: Cough - Acute Non-Productive-A-AH

## 2023-08-17 ENCOUNTER — Ambulatory Visit (INDEPENDENT_AMBULATORY_CARE_PROVIDER_SITE_OTHER): Admitting: Family Medicine

## 2023-08-17 ENCOUNTER — Encounter: Payer: Self-pay | Admitting: Family Medicine

## 2023-08-17 VITALS — BP 130/64 | HR 78 | Ht 69.0 in | Wt 176.0 lb

## 2023-08-17 DIAGNOSIS — N1831 Chronic kidney disease, stage 3a: Secondary | ICD-10-CM

## 2023-08-17 DIAGNOSIS — J449 Chronic obstructive pulmonary disease, unspecified: Secondary | ICD-10-CM

## 2023-08-17 DIAGNOSIS — G479 Sleep disorder, unspecified: Secondary | ICD-10-CM | POA: Diagnosis not present

## 2023-08-17 DIAGNOSIS — L570 Actinic keratosis: Secondary | ICD-10-CM | POA: Insufficient documentation

## 2023-08-17 DIAGNOSIS — E038 Other specified hypothyroidism: Secondary | ICD-10-CM | POA: Diagnosis not present

## 2023-08-17 DIAGNOSIS — J441 Chronic obstructive pulmonary disease with (acute) exacerbation: Secondary | ICD-10-CM

## 2023-08-17 DIAGNOSIS — H2513 Age-related nuclear cataract, bilateral: Secondary | ICD-10-CM | POA: Insufficient documentation

## 2023-08-17 DIAGNOSIS — N183 Chronic kidney disease, stage 3 unspecified: Secondary | ICD-10-CM

## 2023-08-17 HISTORY — DX: Chronic kidney disease, stage 3 unspecified: N18.30

## 2023-08-17 MED ORDER — PREDNISONE 20 MG PO TABS: ORAL_TABLET | ORAL | 0 refills | Status: AC

## 2023-08-17 MED ORDER — AMOXICILLIN-POT CLAVULANATE 875-125 MG PO TABS
1.0000 | ORAL_TABLET | Freq: Two times a day (BID) | ORAL | 0 refills | Status: DC
Start: 2023-08-17 — End: 2023-09-18

## 2023-08-17 NOTE — Patient Instructions (Addendum)
 Use albuterol nebulizer treatment every 4 hours during the day for the next couple of days.  As you are starting to feel better you can decrease down to every 6 hours during the day and then every 8 hours and then taper back.  Start using your flutter valve twice a day especially while you are having a flare.  If you are not feeling better after the weekend please let me know.  You can try melatonin 3 mg over-the-counter for sleep.  Usually have to take it about 30 to 60 minutes before sleep.  Try wearing your oxygen for more intense activities such as yard work.

## 2023-08-17 NOTE — Progress Notes (Signed)
 Acute Office Visit  Subjective:     Patient ID: Zachary Waters, male    DOB: 1941-03-04, 83 y.o.   MRN: [id number]  Chief Complaint  Patient presents with   COPD   Cough    HPI Patient is in today for cough and SOB x 1 wk. No fever. Neg for COVID. No sinus sxs. Tried to mow grass yesterday Stage 4 COPD. Wears oxygen sometimes.   He also recently saw the nephrologist, Dr. Hazel Sams after having the stone removed from his bladder.  His last serum creatinine has come down to 1.35 which is fantastic he brought me a copy of the blood work today.  Also noticed that when he is more active such as doing yard work that night he cannot sleep is not necessarily because of his breathing he just cannot sleep he cannot quite figure out why and what is different.  He wants to know if there is something that he could take safely that would be helpful.  ROS      Objective:    BP 130/64   Pulse 78   Ht 5\' 9"  (1.753 m)   Wt 176 lb (79.8 kg)   SpO2 98%   BMI 25.99 kg/m    Physical Exam Vitals and nursing note reviewed.  Constitutional:      Appearance: Normal appearance.  HENT:     Head: Normocephalic and atraumatic.  Eyes:     Conjunctiva/sclera: Conjunctivae normal.  Cardiovascular:     Rate and Rhythm: Normal rate and regular rhythm.  Pulmonary:     Effort: Pulmonary effort is normal.     Breath sounds: Normal breath sounds.  Skin:    General: Skin is warm and dry.  Neurological:     Mental Status: He is alert.  Psychiatric:        Mood and Affect: Mood normal.     No results found for any visits on 08/17/23.      Assessment & Plan:   Problem List Items Addressed This Visit       Respiratory   COPD, group D, by GOLD 2017 classification (HCC) - Primary   Relevant Medications   predniSONE (DELTASONE) 20 MG tablet     Endocrine   Hypothyroid   Relevant Orders   TSH     Genitourinary   CKD (chronic kidney disease) stage 3, GFR 30-59 ml/min (HCC)   Recent  renal function is down to 1.35 which is fantastic had actually gotten up to 1.55 and since his procedure it has been trending down.  He follows with Dr, Curlene Dolphin.       Other Visit Diagnoses       COPD exacerbation (HCC)       Relevant Medications   predniSONE (DELTASONE) 20 MG tablet     Sleep disturbance           COPD exacerbation - will tx with ABX and steroid.  Use albuterol nebulizer treatment every 4 hours during the day for the next couple of days.  As you are starting to feel better you can decrease down to every 6 hours during the day and then every 8 hours and then taper back.  Start using your flutter valve twice a day especially while you are having a flare.  If you are not feeling better after the weekend please let me know.   Sleep Disturbance-recommend avoid things like Benadryl but can try a low-dose melatonin such as 3 mg to see  if it is helpful also encouraged him to wear his oxygen before doing the work to see if that makes a difference as well.   Meds ordered this encounter  Medications   amoxicillin-clavulanate (AUGMENTIN) 875-125 MG tablet    Sig: Take 1 tablet by mouth 2 (two) times daily.    Dispense:  14 tablet    Refill:  0   predniSONE (DELTASONE) 20 MG tablet    Sig: Take 2 tablets (40 mg total) by mouth daily with breakfast for 5 days, THEN 1 tablet (20 mg total) daily with breakfast for 5 days.    Dispense:  15 tablet    Refill:  0    Return if symptoms worsen or fail to improve.  Nani Gasser, MD

## 2023-08-17 NOTE — Assessment & Plan Note (Signed)
 Recent renal function is down to 1.35 which is fantastic had actually gotten up to 1.55 and since his procedure it has been trending down.  He follows with Dr, Curlene Dolphin.

## 2023-08-18 ENCOUNTER — Encounter: Payer: Self-pay | Admitting: Family Medicine

## 2023-08-18 LAB — TSH: TSH: 0.636 u[IU]/mL (ref 0.450–4.500)

## 2023-08-18 NOTE — Progress Notes (Signed)
 Going to make a slight adjustment on your thyroid medication.  The sweet spot is between 1 and 2 but for the last couple years it has been under 1 just been watching it and it has been bouncing around but consistently under 1.  Some going to have you take a half a tab 1 day a week like Sunday.  Then you will take a whole tab the other 6 days a week.  Repeat this pattern each week and then we will recheck your level in about 2 months.  I just 1 to make sure that were not overmedicating you.  It can increase your risk for heart arrhythmias.

## 2023-08-28 DIAGNOSIS — I10 Essential (primary) hypertension: Secondary | ICD-10-CM | POA: Diagnosis not present

## 2023-08-29 DIAGNOSIS — Z95810 Presence of automatic (implantable) cardiac defibrillator: Secondary | ICD-10-CM | POA: Diagnosis not present

## 2023-08-29 DIAGNOSIS — I472 Ventricular tachycardia, unspecified: Secondary | ICD-10-CM | POA: Diagnosis not present

## 2023-09-07 IMAGING — DX DG CHEST 2V
2 series · 2 of 2 positions shown · non-contrast
Comparison: None Available.

CLINICAL DATA: Increased shortness of breath

EXAM:
CHEST - 2 VIEW

[chest pa]
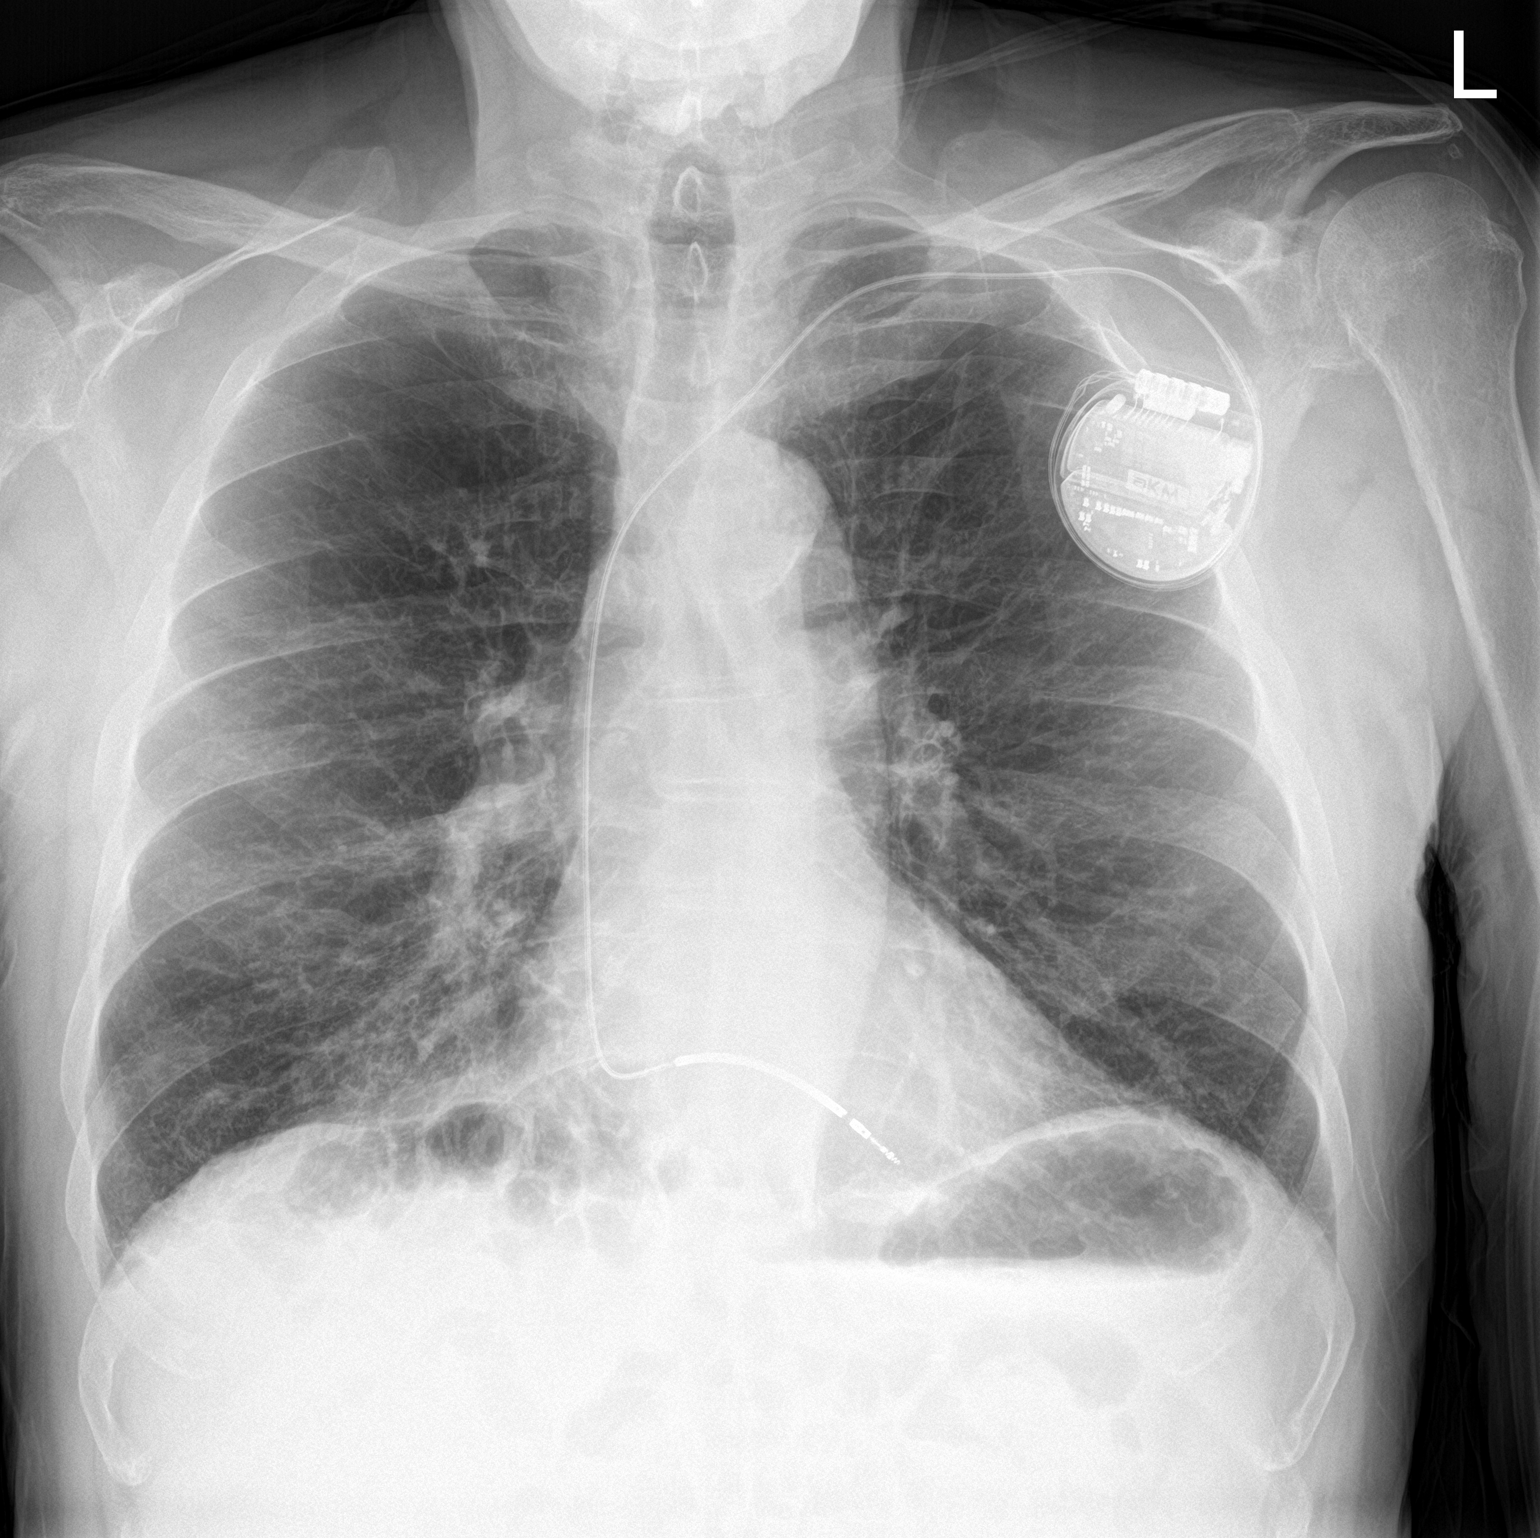

[chest lat]
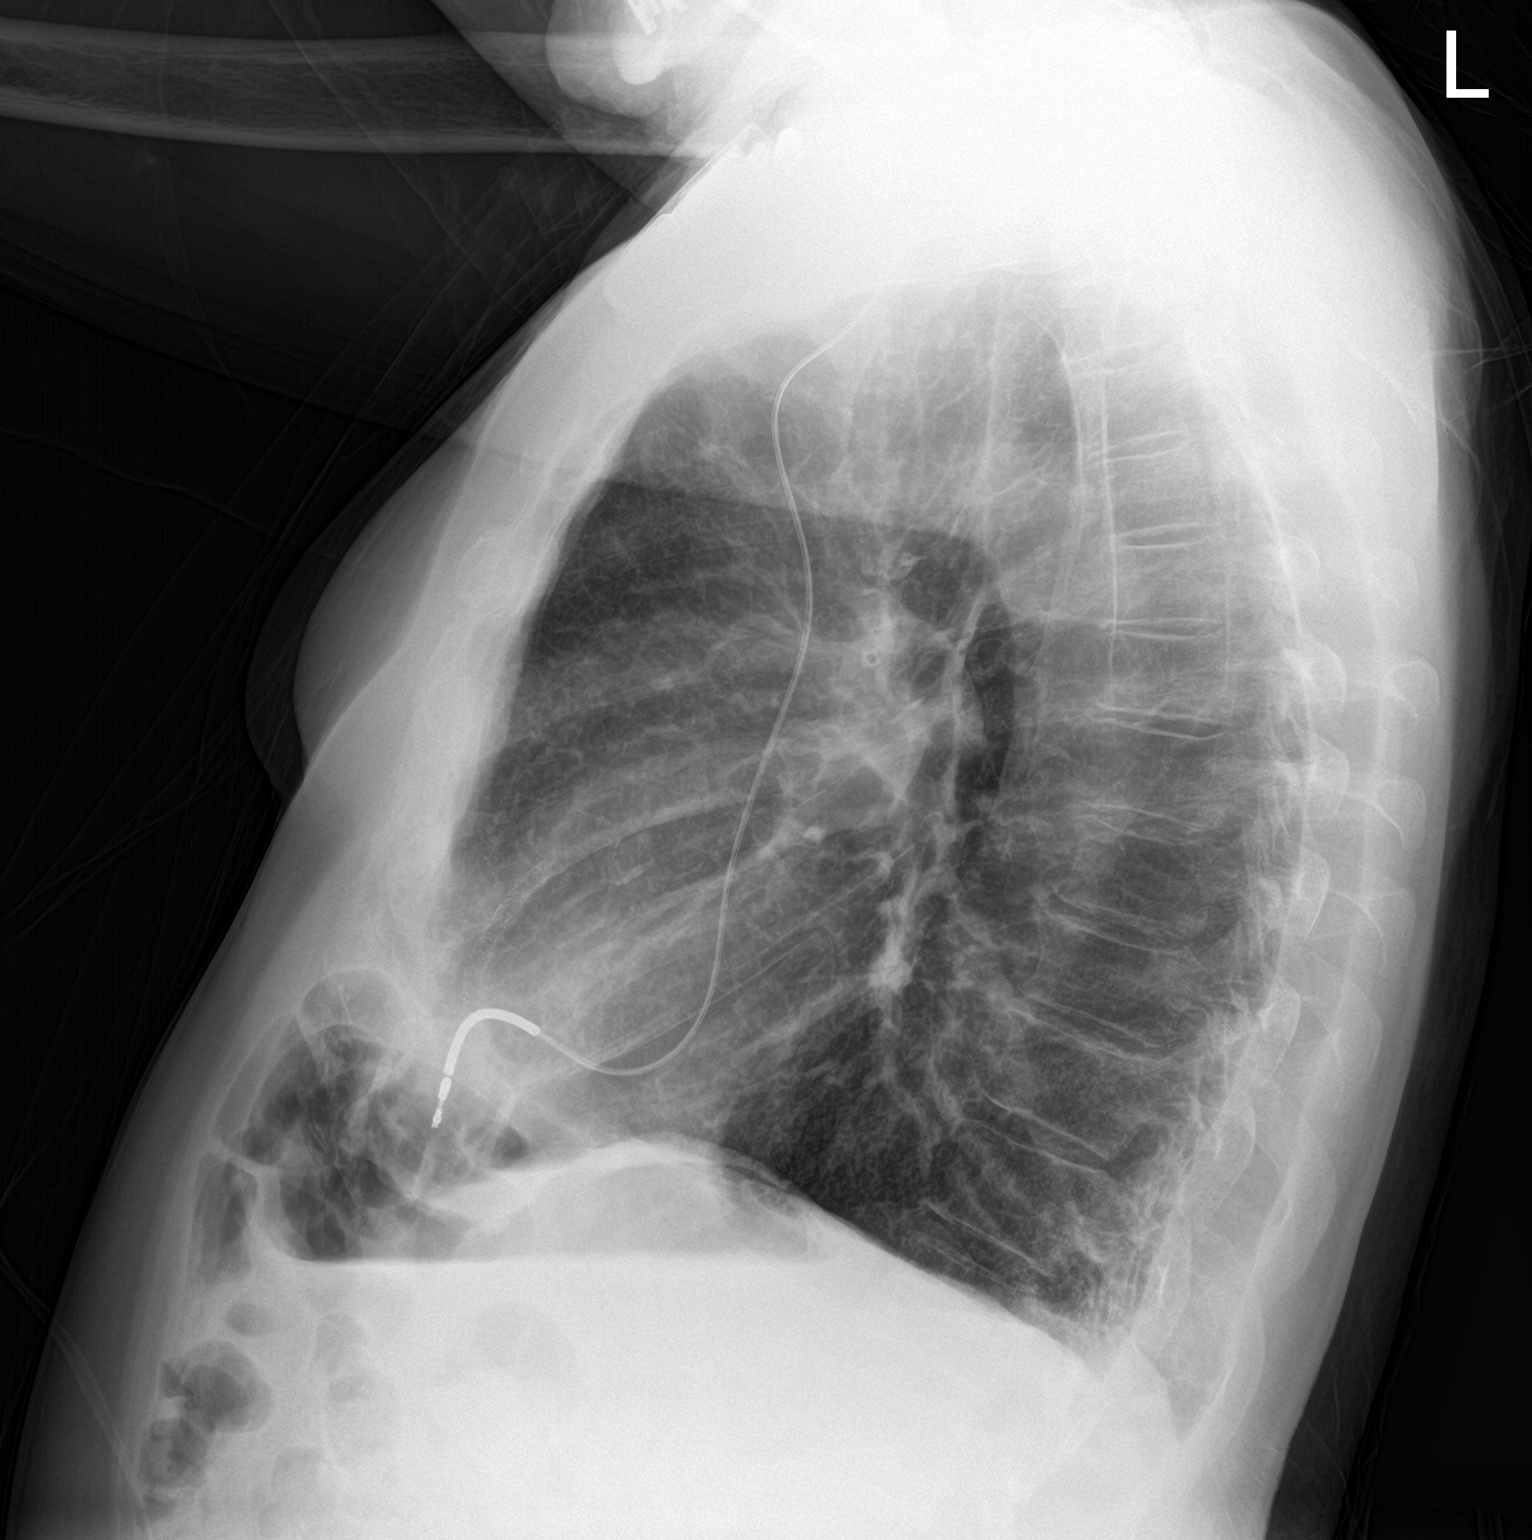

[2 of 2 positions shown; findings below may reference images not displayed]

FINDINGS: The heart size and mediastinal contours are within normal limits.
Atherosclerotic calcification of the aortic arch. Pacemaker lead
terminating in the right ventricle. Emphysematous changes. No focal
consolidation or pleural effusion. The visualized skeletal
structures are unremarkable.
IMPRESSION: No active cardiopulmonary disease.

## 2023-09-18 ENCOUNTER — Ambulatory Visit (INDEPENDENT_AMBULATORY_CARE_PROVIDER_SITE_OTHER): Payer: Medicare Other | Admitting: Family Medicine

## 2023-09-18 ENCOUNTER — Encounter: Payer: Self-pay | Admitting: Family Medicine

## 2023-09-18 VITALS — BP 130/80 | HR 81 | Ht 68.0 in | Wt 175.0 lb

## 2023-09-18 DIAGNOSIS — N1831 Chronic kidney disease, stage 3a: Secondary | ICD-10-CM | POA: Diagnosis not present

## 2023-09-18 DIAGNOSIS — R7301 Impaired fasting glucose: Secondary | ICD-10-CM

## 2023-09-18 DIAGNOSIS — J449 Chronic obstructive pulmonary disease, unspecified: Secondary | ICD-10-CM | POA: Diagnosis not present

## 2023-09-18 DIAGNOSIS — E038 Other specified hypothyroidism: Secondary | ICD-10-CM

## 2023-09-18 DIAGNOSIS — I10 Essential (primary) hypertension: Secondary | ICD-10-CM

## 2023-09-18 DIAGNOSIS — R635 Abnormal weight gain: Secondary | ICD-10-CM

## 2023-09-18 NOTE — Assessment & Plan Note (Signed)
 Due A1C

## 2023-09-18 NOTE — Assessment & Plan Note (Signed)
 Plan to recheck TSH and labs in 3 weeks as he will have been on his new regimen for the last 6 weeks total at that point in time.

## 2023-09-18 NOTE — Patient Instructions (Signed)
 Commend recommend using a smart phone app such as my fitness pal or lose it to track calorie intake and set goals.

## 2023-09-18 NOTE — Assessment & Plan Note (Signed)
Pressure looks great today.  Continue current regimen. 

## 2023-09-18 NOTE — Progress Notes (Unsigned)
 Established Patient Office Visit  Subjective  Patient ID: Zachary Waters, male    DOB: 01-Feb-1941  Age: 83 y.o. MRN: [id number]  Chief Complaint  Patient presents with   Medical Management of Chronic Issues    HPI  6 mo f/u  Hypertension- Pt denies chest pain, SOB, dizziness, or heart palpitations.  Taking meds as directed w/o problems.  Denies medication side effects.    F/U COPD -now on 10 mg of prednisone  daily for better control.  He feels like he is at his baseline.  [facility] Dupixent add onto his treatment therapy.  He is going to make an appoint with Dr. Javaid to discuss it further.  He feels like he could probably get it outside of the [place] approved more easily.  Hypo thyroidism-he did start taking a half a pill on Sundays of his thyroid  medication about 3 weeks ago so far tolerating that well.  He did want to discuss his weight today he is actually up about 10 pounds he thinks some of it is related to his chronic steroids.     ROS    Objective:     BP 130/80   Pulse 81   Ht 5\' 8"  (1.727 m)   Wt 175 lb (79.4 kg)   SpO2 94%   BMI 26.61 kg/m    Physical Exam Vitals and nursing note reviewed.  Constitutional:      Appearance: Normal appearance.  HENT:     Head: Normocephalic and atraumatic.  Eyes:     Conjunctiva/sclera: Conjunctivae normal.  Cardiovascular:     Rate and Rhythm: Normal rate and regular rhythm.  Pulmonary:     Effort: Pulmonary effort is normal.     Breath sounds: Wheezing present.     Comments: Wheezing bilaterally but more pronounced at the right lung base and right anterior chest wall. Skin:    General: Skin is warm and dry.  Neurological:     Mental Status: He is alert.  Psychiatric:        Mood and Affect: Mood normal.      No results found for any visits on 09/18/23.    The ASCVD Risk score (Arnett DK, et al., 2019) failed to calculate for the following reasons:    The 2019 ASCVD risk score is only valid for ages 65 to 1   Risk score cannot be calculated because patient has a medical history suggesting prior/existing ASCVD    Assessment & Plan:   Problem List Items Addressed This Visit       Cardiovascular and Mediastinum   Essential hypertension - Primary   Pressure looks great today.  Continue current regimen.      Relevant Orders   CMP14+EGFR   TSH + free T4   Hemoglobin A1c     Respiratory   COPD, group D, by GOLD 2017 classification (HCC)   Stable.  Just encouraged him to continue to work on some of those exercises that he learned at rehab.  He has been wearing his oxygen  a little bit more frequently at home.  Just reminded him that it can help improve his stamina while exercising.      Relevant Orders   CMP14+EGFR   TSH + free T4   Hemoglobin A1c     Endocrine   IFG (impaired fasting glucose)   Due A1C      Relevant Orders   CMP14+EGFR   TSH + free T4  Hemoglobin A1c   Hypothyroid   Plan to recheck TSH and labs in 3 weeks as he will have been on his new regimen for the last 6 weeks total at that point in time.      Relevant Orders   CMP14+EGFR   TSH + free T4   Hemoglobin A1c     Genitourinary   CKD (chronic kidney disease) stage 3, GFR 30-59 ml/min (HCC)   Will get updated renal function today.      Relevant Orders   CMP14+EGFR   TSH + free T4   Hemoglobin A1c   Other Visit Diagnoses       Abnormal weight gain          ReCommend recommend using a smart phone app such as my fitness pal or lose it to track calorie intake and set goals.  No follow-ups on file.    Duaine German, MD

## 2023-09-22 NOTE — Assessment & Plan Note (Signed)
 Will get updated renal function today.

## 2023-09-22 NOTE — Assessment & Plan Note (Signed)
 Stable.  Just encouraged him to continue to work on some of those exercises that he learned at rehab.  He has been wearing his oxygen  a little bit more frequently at home.  Just reminded him that it can help improve his stamina while exercising.

## 2023-10-11 DIAGNOSIS — J449 Chronic obstructive pulmonary disease, unspecified: Secondary | ICD-10-CM | POA: Diagnosis not present

## 2023-10-11 DIAGNOSIS — N1831 Chronic kidney disease, stage 3a: Secondary | ICD-10-CM | POA: Diagnosis not present

## 2023-10-11 DIAGNOSIS — R7301 Impaired fasting glucose: Secondary | ICD-10-CM | POA: Diagnosis not present

## 2023-10-11 DIAGNOSIS — I10 Essential (primary) hypertension: Secondary | ICD-10-CM | POA: Diagnosis not present

## 2023-10-11 DIAGNOSIS — E038 Other specified hypothyroidism: Secondary | ICD-10-CM | POA: Diagnosis not present

## 2023-10-12 LAB — CMP14+EGFR
ALT: 23 IU/L (ref 0–44)
AST: 25 IU/L (ref 0–40)
Albumin: 4.5 g/dL (ref 3.8–4.8)
Alkaline Phosphatase: 58 IU/L (ref 44–121)
BUN/Creatinine Ratio: 19 (ref 10–24)
BUN: 23 mg/dL (ref 8–27)
Bilirubin Total: 0.6 mg/dL (ref 0.0–1.2)
CO2: 23 mmol/L (ref 20–29)
Calcium: 9.7 mg/dL (ref 8.6–10.2)
Chloride: 102 mmol/L (ref 96–106)
Creatinine, Ser: 1.18 mg/dL (ref 0.76–1.27)
Globulin, Total: 1.8 g/dL (ref 1.5–4.5)
Glucose: 91 mg/dL (ref 70–99)
Potassium: 4.9 mmol/L (ref 3.5–5.2)
Sodium: 142 mmol/L (ref 134–144)
Total Protein: 6.3 g/dL (ref 6.0–8.5)
eGFR: 62 mL/min/{1.73_m2} (ref >59)

## 2023-10-12 LAB — TSH+FREE T4
Free T4: 2.08 ng/dL — ABNORMAL HIGH (ref 0.82–1.77)
TSH: 0.769 u[IU]/mL (ref 0.450–4.500)

## 2023-10-12 LAB — HEMOGLOBIN A1C
Est. average glucose Bld gHb Est-mCnc: 126 mg/dL
Hgb A1c MFr Bld: 6 % — ABNORMAL HIGH (ref 4.8–5.6)

## 2023-10-17 ENCOUNTER — Ambulatory Visit: Payer: Self-pay | Admitting: Family Medicine

## 2023-10-17 NOTE — Progress Notes (Signed)
 Hi Zachary Waters, sorry for the delay in getting back to you about your results I was out of town.  Your thyroid  level looks good so organ to continue with current regimen.  A1c at 6.0 so pretty stable over the last year which is great.  Still in that prediabetes range but again stable.  Metabolic panel looks great.

## 2023-11-23 DIAGNOSIS — Z87891 Personal history of nicotine dependence: Secondary | ICD-10-CM | POA: Diagnosis not present

## 2023-11-23 DIAGNOSIS — R918 Other nonspecific abnormal finding of lung field: Secondary | ICD-10-CM | POA: Diagnosis not present

## 2023-11-23 DIAGNOSIS — I10 Essential (primary) hypertension: Secondary | ICD-10-CM | POA: Diagnosis not present

## 2023-11-23 DIAGNOSIS — J9611 Chronic respiratory failure with hypoxia: Secondary | ICD-10-CM | POA: Diagnosis not present

## 2023-11-23 DIAGNOSIS — J449 Chronic obstructive pulmonary disease, unspecified: Secondary | ICD-10-CM | POA: Diagnosis not present

## 2023-11-28 DIAGNOSIS — I472 Ventricular tachycardia, unspecified: Secondary | ICD-10-CM | POA: Diagnosis not present

## 2023-11-28 DIAGNOSIS — Z95810 Presence of automatic (implantable) cardiac defibrillator: Secondary | ICD-10-CM | POA: Diagnosis not present

## 2023-12-04 DIAGNOSIS — I10 Essential (primary) hypertension: Secondary | ICD-10-CM | POA: Diagnosis not present

## 2023-12-07 ENCOUNTER — Ambulatory Visit: Payer: Self-pay

## 2023-12-07 NOTE — Telephone Encounter (Signed)
 FYI Only or Action Required?: Action required by provider: request for appointment.  Patient was last seen in primary care on 09/18/2023 by Alvan Dorothyann BIRCH, MD.  Called Nurse Triage reporting Cough.  Symptoms began a week ago.  Interventions attempted: Nothing.  Symptoms are: gradually worsening.  Triage Disposition: See Physician Within 24 Hours  Patient/caregiver understands and will follow disposition?: No, wishes to speak with PCPCopied from CRM #[id number]. Topic: Clinical - Red Word Triage >> Dec 07, 2023  9:06 AM Zane F wrote: [facility] that prompted transfer to Nurse Triage:   Productive cough  Symptoms started : About a week ago Reason for Disposition  SEVERE coughing spells (e.g., whooping sound after coughing, vomiting after coughing)  Answer Assessment - Initial Assessment Questions 1. ONSET: When did the cough begin?      Week ago 2. SEVERITY: How bad is the cough today?      Getting  3. SPUTUM: Describe the color of your sputum (e.g., none, dry cough; clear, [place], yellow, green)     clear 4. HEMOPTYSIS: Are you coughing up any blood? If Yes, ask: How much? (e.g., flecks, streaks, tablespoons, etc.)     na 5. DIFFICULTY BREATHING: Are you having difficulty breathing? If Yes, ask: How bad is it? (e.g., mild, moderate, severe)      Stage 4 COPD 6. FEVER: Do you have a fever? If Yes, ask: What is your temperature, how was it measured, and when did it start?     denies 7. CARDIAC HISTORY: Do you have any history of heart disease? (e.g., heart attack, congestive heart failure)      na 8. LUNG HISTORY: Do you have any history of lung disease?  (e.g., pulmonary embolus, asthma, emphysema)     COPD 9. PE RISK FACTORS: Do you have a history of blood clots? (or: recent major surgery, recent prolonged travel, bedridden)     na 10. OTHER SYMPTOMS: Do you have any other symptoms? (e.g., runny nose, wheezing, chest pain)       Denies   Pt  wants appt with antibiotics and only wants to see Dr. Alvan. No appt utnil 9/2. No appt made at this time. Please advise.  Protocols used: Cough - Acute Productive-A-AH

## 2023-12-08 NOTE — Telephone Encounter (Signed)
 This message was from yesterday. You have an opening on Monday @ 3pm. However, he has been experiencing sxs x 1 week.   How how should we proceed?

## 2023-12-08 NOTE — Telephone Encounter (Signed)
 Spoke w/pt and advised him that Dr. Alvan could see him on Monday. He stated that he would be ok with that and appointment scheduled for Monday 12/11/23 @ 3 pm. Pt was advised to seek emergent care should his sxs get worse. He verbalized understanding and agreed.

## 2023-12-11 ENCOUNTER — Encounter: Payer: Self-pay | Admitting: Family Medicine

## 2023-12-11 ENCOUNTER — Ambulatory Visit (INDEPENDENT_AMBULATORY_CARE_PROVIDER_SITE_OTHER): Admitting: Family Medicine

## 2023-12-11 VITALS — BP 131/65 | HR 60 | Ht 68.0 in | Wt 178.0 lb

## 2023-12-11 DIAGNOSIS — J441 Chronic obstructive pulmonary disease with (acute) exacerbation: Secondary | ICD-10-CM | POA: Diagnosis not present

## 2023-12-11 DIAGNOSIS — Z95810 Presence of automatic (implantable) cardiac defibrillator: Secondary | ICD-10-CM | POA: Insufficient documentation

## 2023-12-11 DIAGNOSIS — J432 Centrilobular emphysema: Secondary | ICD-10-CM

## 2023-12-11 MED ORDER — PREDNISONE 5 MG PO TABS: 5.0000 mg | ORAL_TABLET | Freq: Every day | ORAL | 1 refills | Status: DC

## 2023-12-11 MED ORDER — PREDNISONE 20 MG PO TABS: ORAL_TABLET | ORAL | 0 refills | Status: DC

## 2023-12-11 MED ORDER — DOXYCYCLINE HYCLATE 100 MG PO TABS: 100.0000 mg | ORAL_TABLET | Freq: Two times a day (BID) | ORAL | 0 refills | Status: DC

## 2023-12-11 NOTE — Progress Notes (Addendum)
 Acute Office Visit  Subjective:     Patient ID: Zachary Waters, male    DOB: [birth year], 83 y.o.   MRN: [id number]  Chief Complaint  Patient presents with   Cough   COPD    HPI Patient is in today for increased cough and sputum production and shortness of breath going on just little over a week.  He has had [place]/gray-colored sputum.  He says it has not been severe but he has noticed he has been using his oxygen  a little bit more.  He is currently on a maintenance dose of prednisone  he has been taking 10 mg every other day because they are too tiny to split.  No fever, chills etc.  He says that the pulmonologist nurse practitioner encouraged him to start taking calcium with vitamin D  since he is on some chronic prednisone  he just wanted to make sure that that seemed appropriate.  ROS      Objective:    BP 131/65   Pulse 60   Ht 5' 8 (1.727 m)   Wt 178 lb (80.7 kg)   SpO2 96%   BMI 27.06 kg/m    Physical Exam Constitutional:      Appearance: Normal appearance.  HENT:     Head: Normocephalic and atraumatic.     Right Ear: Tympanic membrane, ear canal and external ear normal. There is no impacted cerumen.     Left Ear: Tympanic membrane, ear canal and external ear normal. There is no impacted cerumen.     Nose: Nose normal.     Mouth/Throat:     Pharynx: Oropharynx is clear.  Eyes:     Conjunctiva/sclera: Conjunctivae normal.  Cardiovascular:     Rate and Rhythm: Normal rate and regular rhythm.     Comments: Wheezing at the left lung base Pulmonary:     Effort: Pulmonary effort is normal.     Breath sounds: Normal breath sounds.  Musculoskeletal:     Cervical back: Neck supple. No tenderness.  Lymphadenopathy:     Cervical: No cervical adenopathy.  Skin:    General: Skin is warm and dry.  Neurological:     Mental Status: He is alert and oriented to person, place, and time.  Psychiatric:        Mood and Affect: Mood normal.     No results found for  any visits on 12/11/23.      Assessment & Plan:   Problem List Items Addressed This Visit       Respiratory   Centrilobular emphysema (HCC) - Primary   Relevant Medications   predniSONE  (DELTASONE ) 20 MG tablet   predniSONE  (DELTASONE ) 5 MG tablet     Other   ICD (implantable cardioverter-defibrillator) in place   Other Visit Diagnoses       COPD exacerbation (HCC)       Relevant Medications   doxycycline  (VIBRA -TABS) 100 MG tablet   predniSONE  (DELTASONE ) 20 MG tablet   predniSONE  (DELTASONE ) 5 MG tablet      COPD exacerbation-will treat with doxycycline  and prednisone .  Once he is through this exacerbation then he can try going down to 5 mg daily instead of doing a 10 mg every other day probably would work a little better.  He has been trying to taper down on his chronic prednisone  use.  He did have some questions about calcium and vitamin D  I do think this would be beneficial for his bone health especially since he is now on chronic  prednisone .  Okay to continue with calcium and vitamin D  especially since he is on chronic prednisone  actually think that is a pretty reasonable supplement to take just make sure it is 4 hours away from the thyroid  medication  Meds ordered this encounter  Medications   doxycycline  (VIBRA -TABS) 100 MG tablet    Sig: Take 1 tablet (100 mg total) by mouth 2 (two) times daily.    Dispense:  14 tablet    Refill:  0   predniSONE  (DELTASONE ) 20 MG tablet    Sig: Take 2 tablets (40 mg total) by mouth daily with breakfast for 5 days, THEN 1 tablet (20 mg total) daily with breakfast for 4 days, THEN 0.5 tablets (10 mg total) daily with breakfast for 4 days.    Dispense:  16 tablet    Refill:  0   predniSONE  (DELTASONE ) 5 MG tablet    Sig: Take 1 tablet (5 mg total) by mouth daily with breakfast.    Dispense:  90 tablet    Refill:  1    No follow-ups on file.  Dorothyann Byars, MD

## 2023-12-17 ENCOUNTER — Encounter: Payer: Self-pay | Admitting: Family Medicine

## 2023-12-17 DIAGNOSIS — J441 Chronic obstructive pulmonary disease with (acute) exacerbation: Secondary | ICD-10-CM

## 2023-12-18 ENCOUNTER — Ambulatory Visit

## 2023-12-18 DIAGNOSIS — J441 Chronic obstructive pulmonary disease with (acute) exacerbation: Secondary | ICD-10-CM | POA: Diagnosis not present

## 2023-12-18 DIAGNOSIS — Z95810 Presence of automatic (implantable) cardiac defibrillator: Secondary | ICD-10-CM | POA: Diagnosis not present

## 2023-12-18 DIAGNOSIS — J439 Emphysema, unspecified: Secondary | ICD-10-CM | POA: Diagnosis not present

## 2023-12-19 ENCOUNTER — Encounter: Payer: Self-pay | Admitting: Family Medicine

## 2023-12-19 MED ORDER — PREDNISONE 20 MG PO TABS
ORAL_TABLET | ORAL | 0 refills | Status: AC
Start: 2023-12-19 — End: 2024-01-01

## 2023-12-19 MED ORDER — AMOXICILLIN-POT CLAVULANATE 875-125 MG PO TABS
1.0000 | ORAL_TABLET | Freq: Two times a day (BID) | ORAL | 0 refills | Status: DC
Start: 2023-12-19 — End: 2024-04-02

## 2023-12-19 NOTE — Addendum Note (Signed)
 Addended by: Jaimi Belle D on: 12/19/2023 12:00 PM   Modules accepted: Orders

## 2023-12-19 NOTE — Telephone Encounter (Signed)
 Meds ordered this encounter  Medications   amoxicillin -clavulanate (AUGMENTIN ) 875-125 MG tablet    Sig: Take 1 tablet by mouth 2 (two) times daily.    Dispense:  14 tablet    Refill:  0   predniSONE  (DELTASONE ) 20 MG tablet    Sig: Take 2 tablets (40 mg total) by mouth daily with breakfast for 5 days, THEN 1 tablet (20 mg total) daily with breakfast for 4 days, THEN 0.5 tablets (10 mg total) daily with breakfast for 4 days.    Dispense:  16 tablet    Refill:  0

## 2023-12-21 ENCOUNTER — Ambulatory Visit: Payer: Self-pay | Admitting: Family Medicine

## 2023-12-21 NOTE — Progress Notes (Signed)
 Hi Zachary Waters, the radiologist did confirm no sign of pneumonia which is great.  Fingers crossed that you are starting to feel little bit better.

## 2023-12-27 ENCOUNTER — Encounter: Payer: Self-pay | Admitting: Family Medicine

## 2024-01-24 ENCOUNTER — Other Ambulatory Visit: Payer: Self-pay | Admitting: *Deleted

## 2024-01-24 ENCOUNTER — Encounter: Payer: Self-pay | Admitting: Family Medicine

## 2024-01-24 DIAGNOSIS — J441 Chronic obstructive pulmonary disease with (acute) exacerbation: Secondary | ICD-10-CM

## 2024-01-24 MED ORDER — AMBULATORY NON FORMULARY MEDICATION: 0 refills | Status: AC

## 2024-01-24 MED ORDER — AMBULATORY NON FORMULARY MEDICATION: 0 refills | Status: DC

## 2024-02-01 ENCOUNTER — Encounter: Payer: Self-pay | Admitting: Family Medicine

## 2024-02-02 NOTE — Telephone Encounter (Signed)
 Called patient left vm to call back and schedule appt for physical

## 2024-02-05 DIAGNOSIS — I10 Essential (primary) hypertension: Secondary | ICD-10-CM | POA: Diagnosis not present

## 2024-02-05 DIAGNOSIS — N1831 Chronic kidney disease, stage 3a: Secondary | ICD-10-CM | POA: Diagnosis not present

## 2024-02-06 NOTE — Telephone Encounter (Signed)
 Patient chart shows scheduled for 03/21/2024 for physical

## 2024-02-12 DIAGNOSIS — I10 Essential (primary) hypertension: Secondary | ICD-10-CM | POA: Diagnosis not present

## 2024-02-12 DIAGNOSIS — I469 Cardiac arrest, cause unspecified: Secondary | ICD-10-CM | POA: Diagnosis not present

## 2024-02-12 DIAGNOSIS — I5189 Other ill-defined heart diseases: Secondary | ICD-10-CM | POA: Diagnosis not present

## 2024-02-12 DIAGNOSIS — I2510 Atherosclerotic heart disease of native coronary artery without angina pectoris: Secondary | ICD-10-CM | POA: Diagnosis not present

## 2024-02-19 DIAGNOSIS — I469 Cardiac arrest, cause unspecified: Secondary | ICD-10-CM | POA: Diagnosis not present

## 2024-02-19 DIAGNOSIS — I10 Essential (primary) hypertension: Secondary | ICD-10-CM | POA: Diagnosis not present

## 2024-02-19 DIAGNOSIS — I2510 Atherosclerotic heart disease of native coronary artery without angina pectoris: Secondary | ICD-10-CM | POA: Diagnosis not present

## 2024-02-27 DIAGNOSIS — I472 Ventricular tachycardia, unspecified: Secondary | ICD-10-CM | POA: Diagnosis not present

## 2024-02-27 DIAGNOSIS — Z95810 Presence of automatic (implantable) cardiac defibrillator: Secondary | ICD-10-CM | POA: Diagnosis not present

## 2024-03-21 ENCOUNTER — Encounter: Admitting: Family Medicine

## 2024-04-02 ENCOUNTER — Ambulatory Visit (INDEPENDENT_AMBULATORY_CARE_PROVIDER_SITE_OTHER): Admitting: Family Medicine

## 2024-04-02 ENCOUNTER — Encounter: Payer: Self-pay | Admitting: Family Medicine

## 2024-04-02 VITALS — BP 126/64 | HR 78 | Ht 68.0 in

## 2024-04-02 DIAGNOSIS — I10 Essential (primary) hypertension: Secondary | ICD-10-CM

## 2024-04-02 DIAGNOSIS — N1831 Chronic kidney disease, stage 3a: Secondary | ICD-10-CM | POA: Diagnosis not present

## 2024-04-02 DIAGNOSIS — R7301 Impaired fasting glucose: Secondary | ICD-10-CM | POA: Diagnosis not present

## 2024-04-02 DIAGNOSIS — E038 Other specified hypothyroidism: Secondary | ICD-10-CM

## 2024-04-02 DIAGNOSIS — Z0000 Encounter for general adult medical examination without abnormal findings: Secondary | ICD-10-CM

## 2024-04-02 NOTE — Progress Notes (Signed)
 Complete physical exam  Patient: Zachary Waters [facility]    DOB: 1941-03-26 83 y.o.   MRN: [id number]  Chief Complaint  Patient presents with   Annual Exam    Subjective:    Zachary Waters is a 83 y.o. male who presents today for a complete physical exam. He reports consuming a general diet. The patient does not participate in regular exercise at present. He generally feels fairly well. He reports sleeping well. He does not have additional problems to discuss today.   Discussed the use of AI scribe software for clinical note transcription with the patient, who gave verbal consent to proceed.  History of Present Illness [name] is an 83 year old male who presents for follow-up on dermatological procedures and management of chronic conditions.  Postoperative dermatological wound healing - Recent dermatological surgery for a irght upper leg lesion requiring 14 stitches - Lesion identified and operated on the same day by dermatologist - Surgical site has healed well  Cutaneous lesions - Separate evaluation for a facial lesion, which was circled and photographed by dermatologist  Glucocorticoid taper and adverse effects - Currently tapering prednisone , reduced from 10 mg to 1 mg over past few weeks - Plans to discontinue prednisone  in the next few days - Weight gain attributed to steroid use  Peripheral edema - Swelling in ankles since October 17th - Daily use of compression socks, removed at night - Reduction in swelling with compression therapy - Inquiry regarding duration of compression sock use  Supplement use and medication interaction concerns - Regular use of vitamin C - Previously advised to take calcium and vitamin D  supplements - Concern about potential interaction between supplements and prednisone  - Plans to resume calcium and vitamin D  supplementation after discontinuing prednisone   Mobility impairment - Difficulty with mobility - Considering  use of a scooter for activities such as grocery shopping - Continues to manage household tasks including bill payments and checkbook balancing - No longer able to play golf, considering scooter for mobility assistance  Respiratory symptoms - Variable breathing difficulties - No current issues with mucus production   Most recent fall risk assessment:    04/19/2023    9:27 AM  Fall Risk   Falls in the past year? 0  Number falls in past yr: 0  Injury with Fall? 0  Risk for fall due to : No Fall Risks     Most recent depression screenings:    04/02/2024   11:52 AM 04/19/2023    9:24 AM  PHQ 2/9 Scores  PHQ - 2 Score 0 0  PHQ- 9 Score 2         Patient Care Team: Alvan Dorothyann BIRCH, MD as PCP - General (Family Medicine) Michae Leu (Pulmonary Disease) Kathllen Nazar (Chiropractic Medicine) Sheldon Standing, MD as Consulting Physician (General Surgery) Beverli Cara PARAS, [facility] (Inactive) as Pharmacist (Pharmacist) Beverli Cara PARAS, [facility] (Inactive) (Pharmacist)   ROS    Objective:    BP 126/64   Pulse 78   Ht 5' 8 (1.727 m)   SpO2 95%   BMI 27.06 kg/m     Physical Exam Constitutional:      Appearance: Normal appearance.     Comments: On Oxygen    HENT:     Head: Normocephalic and atraumatic.     Right Ear: Tympanic membrane, ear canal and external ear normal.     Left Ear: Tympanic membrane, ear canal and external ear normal.     Nose: Nose normal.  Mouth/Throat:     Pharynx: Oropharynx is clear.  Eyes:     Extraocular Movements: Extraocular movements intact.     Conjunctiva/sclera: Conjunctivae normal.     Pupils: Pupils are equal, round, and reactive to light.  Neck:     Thyroid : No thyromegaly.  Cardiovascular:     Rate and Rhythm: Normal rate and regular rhythm.  Pulmonary:     Effort: Pulmonary effort is normal.     Breath sounds: Normal breath sounds.  Abdominal:     General: Bowel sounds are normal.     Palpations: Abdomen is soft.      Tenderness: There is no abdominal tenderness.  Musculoskeletal:        General: No swelling.     Cervical back: Neck supple.     Comments: He is wearing his compression stockings today.  Some trace edema in both ankles bilaterally  Skin:    General: Skin is warm and dry.  Neurological:     Mental Status: He is oriented to person, place, and time.  Psychiatric:        Mood and Affect: Mood normal.        Behavior: Behavior normal.       No results found for any visits on 04/02/24.      Assessment & Plan:    Routine Health Maintenance and Physical Exam Immunization History  Administered Date(s) Administered    sv, Bivalent, Protein Subunit Rsvpref,pf (Abrysvo) 12/20/2022   Fluad Quad(high Dose 65+) 02/12/2020, 02/24/2021   Fluad Trivalent(High Dose 65+) 03/21/2023, 03/21/2024   INFLUENZA, HIGH DOSE SEASONAL PF 01/24/2012, 02/04/2016, 02/02/2017, 02/06/2018, 02/05/2019   Influenza Inj Mdck Quad Pf 03/01/2022   Influenza Split 02/06/2013   Influenza,inj,Quad PF,6+ Mos 02/12/2013, 01/09/2014, 01/13/2015, 02/07/2016   Moderna Covid-19 Vaccine Bivalent Booster 32yrs & up 02/24/2021   Moderna Sars-Covid-2 Vaccination 05/30/2019, 06/11/2019, 07/02/2019, 03/26/2020, 08/24/2020   PNEUMOCOCCAL CONJUGATE-20 03/17/2022   Pfizer(Comirnaty)Fall Seasonal Vaccine 12 years and older 03/10/2022, 03/21/2023   Pneumococcal Conjugate-13 08/06/2013, 12/20/2013, 05/15/2019   Pneumococcal Polysaccharide-23 05/09/2010   Pneumococcal-Unspecified 06/09/2010   Tdap 01/03/2012, 12/23/2021   Zoster Recombinant(Shingrix) 02/09/2017, 04/13/2017   Zoster, Live 10/27/2010, 02/09/2017    Health Maintenance  Topic Date Due   Lung Cancer Screening  07/07/2019   Colonoscopy  07/17/2022   COVID-19 Vaccine (9 - 2025-26 season) 01/08/2024   Medicare Annual Wellness (AWV)  04/18/2024   DTaP/Tdap/Td (3 - Td or Tdap) 12/24/2031   Pneumococcal Vaccine: 50+ Years  Completed   Influenza Vaccine  Completed    Zoster Vaccines- Shingrix  Completed   Meningococcal B Vaccine  Aged Out   Hepatitis C Screening  Discontinued    Discussed health benefits of physical activity, and encouraged him to engage in regular exercise appropriate for his age and condition.  Problem List Items Addressed This Visit       Cardiovascular and Mediastinum   Essential hypertension   Relevant Orders   CMP14+EGFR   Lipid panel   CBC     Endocrine   IFG (impaired fasting glucose)   Relevant Orders   Hemoglobin A1c   Hypothyroid   Relevant Orders   TSH     Genitourinary   CKD (chronic kidney disease) stage 3, GFR 30-59 ml/min (HCC)   Relevant Orders   CMP14+EGFR   Lipid panel   CBC   Other Visit Diagnoses       Wellness examination    -  Primary       Assessment and Plan  Assessment & Plan Adult Wellness Visit Routine wellness visit with discussion on prednisone  tapering and compression stockings for venous insufficiency. Calcium and vitamin D  supplementation discussed post-prednisone  taper due to potential bone thinning from long-term steroid use. - Continue prednisone  taper to 1 mg for 3-4 days, then discontinue. - Wear compression stockings daily to manage venous insufficiency. - Consider calcium and vitamin D  supplementation after prednisone  taper. - Will get updated lab work today.  Chronic venous insufficiency with lower extremity edema Chronic venous insufficiency with lower extremity edema due to leaky venous valves, exacerbated by age and possibly genetic factors. Compression stockings effectively manage swelling. - Continue wearing compression stockings daily to manage edema.  Pulmonary disease, unspecified Unspecified pulmonary disease with ongoing management. Reports variable breathing difficulties, not significantly impacted by weather changes. Continues to engage in daily activities despite challenges. - Continue current management and monitor symptoms. - Encouraged continued  engagement in daily activities.    Return in about 1 year (around 04/02/2025) for Wellness Exam.    Dorothyann Byars, MD [facility] Medicine at [facility]

## 2024-04-03 ENCOUNTER — Ambulatory Visit: Payer: Self-pay | Admitting: Family Medicine

## 2024-04-03 LAB — CBC
Hematocrit: 47.9 % (ref 37.5–51.0)
Hemoglobin: 15.2 g/dL (ref 13.0–17.7)
MCH: 31.9 pg (ref 26.6–33.0)
MCHC: 31.7 g/dL (ref 31.5–35.7)
MCV: 101 fL — ABNORMAL HIGH (ref 79–97)
Platelets: 232 x10E3/uL (ref 150–450)
RBC: 4.76 x10E6/uL (ref 4.14–5.80)
RDW: 11.7 % (ref 11.6–15.4)
WBC: 6.4 x10E3/uL (ref 3.4–10.8)

## 2024-04-03 LAB — CMP14+EGFR
ALT: 26 IU/L (ref 0–44)
AST: 31 IU/L (ref 0–40)
Albumin: 4.5 g/dL (ref 3.8–4.8)
Alkaline Phosphatase: 69 IU/L (ref 47–123)
BUN/Creatinine Ratio: 19 (ref 10–24)
BUN: 27 mg/dL (ref 8–27)
Bilirubin Total: 0.5 mg/dL (ref 0.0–1.2)
CO2: 22 mmol/L (ref 20–29)
Calcium: 9.8 mg/dL (ref 8.6–10.2)
Chloride: 103 mmol/L (ref 96–106)
Creatinine, Ser: 1.45 mg/dL — ABNORMAL HIGH (ref 0.76–1.27)
Globulin, Total: 2.1 g/dL (ref 1.5–4.5)
Glucose: 98 mg/dL (ref 70–99)
Potassium: 5.5 mmol/L — ABNORMAL HIGH (ref 3.5–5.2)
Sodium: 141 mmol/L (ref 134–144)
Total Protein: 6.6 g/dL (ref 6.0–8.5)
eGFR: 49 mL/min/1.73 — ABNORMAL LOW (ref >59)

## 2024-04-03 LAB — HEMOGLOBIN A1C
Est. average glucose Bld gHb Est-mCnc: 117 mg/dL
Hgb A1c MFr Bld: 5.7 % — ABNORMAL HIGH (ref 4.8–5.6)

## 2024-04-03 LAB — TSH: TSH: 0.202 u[IU]/mL — ABNORMAL LOW (ref 0.450–4.500)

## 2024-04-03 LAB — LIPID PANEL
Chol/HDL Ratio: 1.9 ratio (ref 0.0–5.0)
Cholesterol, Total: 124 mg/dL (ref 100–199)
HDL: 64 mg/dL (ref >39)
LDL Chol Calc (NIH): 43 mg/dL (ref 0–99)
Triglycerides: 86 mg/dL (ref 0–149)
VLDL Cholesterol Cal: 17 mg/dL (ref 5–40)

## 2024-04-03 NOTE — Progress Notes (Signed)
 Hi Zachary Waters, kidney function is pretty stable normally around 1.4.  Liver enzymes look good.  Blood count is stable.  The MCV is trending upward so we are gena call the lab and see if they can add a B12 and a folate level just to make sure that they are normal.  Deficiencies in those 2 minerals can cause a slightly elevated MCV.  A1c is stable at 5.7 still in the prediabetes range.  TSH is a little on the lower side at 0.2.  Just curious how your dosing changed on the levothyroxine  recently or the brand that you are taking?  Cholesterol looks great.

## 2024-04-23 ENCOUNTER — Encounter: Payer: Self-pay | Admitting: Family Medicine

## 2024-04-23 NOTE — Telephone Encounter (Signed)
 Okay to double book him tomorrow morning while on here also encouraged him to wear his oxygen  continuously right now

## 2024-04-24 ENCOUNTER — Ambulatory Visit (INDEPENDENT_AMBULATORY_CARE_PROVIDER_SITE_OTHER): Admitting: Family Medicine

## 2024-04-24 ENCOUNTER — Ambulatory Visit

## 2024-04-24 ENCOUNTER — Encounter: Payer: Self-pay | Admitting: Family Medicine

## 2024-04-24 VITALS — BP 126/57 | HR 93 | Temp 98.5°F | Ht 68.0 in | Wt 178.8 lb

## 2024-04-24 DIAGNOSIS — J432 Centrilobular emphysema: Secondary | ICD-10-CM | POA: Diagnosis not present

## 2024-04-24 DIAGNOSIS — J441 Chronic obstructive pulmonary disease with (acute) exacerbation: Secondary | ICD-10-CM

## 2024-04-24 MED ORDER — PREDNISONE 20 MG PO TABS: ORAL_TABLET | ORAL | 0 refills | Status: AC

## 2024-04-24 MED ORDER — AMOXICILLIN-POT CLAVULANATE 875-125 MG PO TABS: 1.0000 | ORAL_TABLET | Freq: Two times a day (BID) | ORAL | 0 refills | Status: AC

## 2024-04-24 NOTE — Telephone Encounter (Signed)
 Patient seen  in office today with Dr. Greer Leak

## 2024-04-24 NOTE — Progress Notes (Signed)
 Acute Office Visit  Patient ID: Zachary Waters, male    DOB: 1941-04-20, 83 y.o.   MRN: [id number]  PCP: Alvan Dorothyann BIRCH, MD  Chief Complaint  Patient presents with   COPD exacerbation    Patient in office with wife - c/o increased SOB, and fatigue x last 3 days.    Subjective:     HPI  Discussed the use of AI scribe software for clinical note transcription with the patient, who gave verbal consent to proceed.  History of Present Illness Zachary Waters is an 83 year old male with COPD who presents with increased fatigue and respiratory symptoms.  Fatigue and sleep disturbance - Marked increase in fatigue since Sunday - Sleeping more than usual, including falling asleep on the couch early in the evening - Remained in bed all day Tuesday due to exhaustion - Expresses feeling extremely tired  Respiratory symptoms - Persistent cough with Guile sputum production, described as 'gut wrenching' - Significant expectoration - No chest pain, throat pain, or ear pain - No fever - No sick contacts - Did not use oxygen  while sleeping on the couch, but used oxygen  all night in bed, which is atypical for him - Uses a nebulizer, unsure if it contains steroids  Gastrointestinal symptoms - Marked decrease in appetite since Sunday - No bowel movements for two to three days - Typically has a bowel movement after showering, but this has not occurred recently  Medication and endocrine history - History of thyroid  issues - No recent changes in medication regimen - Takes thyroid  medication first thing in the morning - Other medications taken a few hours after thyroid  pill - Off prednisone  for about three and a half weeks after tapering with pulmonary specialist  Dermatological findings - Healing scab on face from dermatological procedure two weeks ago  Social and environmental factors - Often by himself - Avoids going out due to weather concerns   ROS      Objective:    BP (!) 126/57   Pulse 93   Temp 98.5 F (36.9 C)   Ht 5' 8 (1.727 m)   Wt 178 lb 12 oz (81.1 kg)   SpO2 92% Comment: started at 88% increased to 92% with resting  BMI 27.18 kg/m    Physical Exam Constitutional:      Appearance: Normal appearance.  HENT:     Head: Normocephalic and atraumatic.     Nose: Nose normal.     Mouth/Throat:     Pharynx: Oropharynx is clear.  Eyes:     Conjunctiva/sclera: Conjunctivae normal.  Cardiovascular:     Rate and Rhythm: Normal rate and regular rhythm.  Pulmonary:     Effort: Pulmonary effort is normal.     Breath sounds: Wheezing present.     Comments: Crackles at the right lung base Musculoskeletal:     Cervical back: Neck supple. No tenderness.  Lymphadenopathy:     Cervical: No cervical adenopathy.  Skin:    General: Skin is warm and dry.  Neurological:     Mental Status: He is alert and oriented to person, place, and time.  Psychiatric:        Mood and Affect: Mood normal.       No results found for any visits on 04/24/24.     Assessment & Plan:   Problem List Items Addressed This Visit       Respiratory   Centrilobular emphysema (HCC) - Primary   Relevant Medications  amoxicillin -clavulanate (AUGMENTIN ) 875-125 MG tablet   predniSONE  (DELTASONE ) 20 MG tablet   Other Visit Diagnoses       COPD exacerbation (HCC)       Relevant Medications   amoxicillin -clavulanate (AUGMENTIN ) 875-125 MG tablet   predniSONE  (DELTASONE ) 20 MG tablet   Other Relevant Orders   DG Chest 2 View   CBC with Differential/Platelet       Assessment and Plan Assessment & Plan COPD exacerbation Increased fatigue, decreased appetite, and absence of bowel movements. No fever, but unusual fatigue and altered mental status. Crackles in the right lung base suggest possible early pneumonia. Differential includes COPD exacerbation versus early pneumonia. - Ordered CBC to assess Enslin blood cell count. - Ordered chest  x-ray to evaluate for pneumonia. - Prescribed Augmentin  for potential bacterial infection. - Prescribed prednisone  for COPD exacerbation. - Instructed to wear oxygen  24 hours a day while sick until baseline is restored. - Advised hydration and rest, with encouragement to move around to prevent muscle weakness.  Subclinical hyperthyroidism No changes in thyroid  medication or dosage. Continues to take medication as prescribed. - Continue current thyroid  medication regimen. -plan to recheck in 8 weeks.     Meds ordered this encounter  Medications   amoxicillin -clavulanate (AUGMENTIN ) 875-125 MG tablet    Sig: Take 1 tablet by mouth 2 (two) times daily.    Dispense:  20 tablet    Refill:  0   predniSONE  (DELTASONE ) 20 MG tablet    Sig: Take 2 tablets (40 mg total) by mouth daily for 4 days, THEN 1 tablet (20 mg total) daily for 4 days, THEN 0.5 tablets (10 mg total) daily for 4 days.    Dispense:  14 tablet    Refill:  0    No follow-ups on file.  Dorothyann Byars, MD [facility] Medicine at [facility]

## 2024-04-24 NOTE — Telephone Encounter (Signed)
 Patient scheduled.

## 2024-04-25 LAB — CBC WITH DIFFERENTIAL/PLATELET
Basophils Absolute: 0 x10E3/uL (ref 0.0–0.2)
Basos: 1 %
EOS (ABSOLUTE): 0.1 x10E3/uL (ref 0.0–0.4)
Eos: 2 %
Hematocrit: 43 % (ref 37.5–51.0)
Hemoglobin: 14.3 g/dL (ref 13.0–17.7)
Immature Grans (Abs): 0 x10E3/uL (ref 0.0–0.1)
Immature Granulocytes: 0 %
Lymphocytes Absolute: 0.6 x10E3/uL — ABNORMAL LOW (ref 0.7–3.1)
Lymphs: 9 %
MCH: 32.4 pg (ref 26.6–33.0)
MCHC: 33.3 g/dL (ref 31.5–35.7)
MCV: 98 fL — ABNORMAL HIGH (ref 79–97)
Monocytes Absolute: 0.6 x10E3/uL (ref 0.1–0.9)
Monocytes: 10 %
Neutrophils Absolute: 5.1 x10E3/uL (ref 1.4–7.0)
Neutrophils: 78 %
Platelets: 201 x10E3/uL (ref 150–450)
RBC: 4.41 x10E6/uL (ref 4.14–5.80)
RDW: 11.3 % — ABNORMAL LOW (ref 11.6–15.4)
WBC: 6.6 x10E3/uL (ref 3.4–10.8)

## 2024-04-25 NOTE — Progress Notes (Signed)
 Blood count looks reassuring.

## 2024-04-30 ENCOUNTER — Encounter: Admitting: Family Medicine

## 2024-05-05 ENCOUNTER — Encounter: Payer: Self-pay | Admitting: Family Medicine

## 2024-05-06 ENCOUNTER — Encounter: Payer: Self-pay | Admitting: Family Medicine

## 2024-05-06 NOTE — Progress Notes (Signed)
 Great news!  No sign of pneumonia which is great.  Just the hyperinflation that we typically see with COPD and emphysema hopefully you are feeling better just let me know.

## 2024-06-01 ENCOUNTER — Other Ambulatory Visit: Payer: Self-pay | Admitting: Family Medicine

## 2024-06-01 DIAGNOSIS — J441 Chronic obstructive pulmonary disease with (acute) exacerbation: Secondary | ICD-10-CM

## 2024-06-09 ENCOUNTER — Other Ambulatory Visit: Payer: Self-pay | Admitting: Family Medicine

## 2025-04-08 ENCOUNTER — Encounter: Admitting: Family Medicine
